# Patient Record
Sex: Male | Born: 1945 | Race: White | Hispanic: No | Marital: Married | Smoking: Former smoker
Health system: Southern US, Community
[De-identification: ages and names within clinical notes are randomized; demographics above are authoritative.]

## PROBLEM LIST (undated history)

## (undated) DIAGNOSIS — G609 Hereditary and idiopathic neuropathy, unspecified: Secondary | ICD-10-CM

## (undated) DIAGNOSIS — I714 Abdominal aortic aneurysm, without rupture, unspecified: Secondary | ICD-10-CM

## (undated) DIAGNOSIS — E7800 Pure hypercholesterolemia, unspecified: Secondary | ICD-10-CM

## (undated) DIAGNOSIS — F5104 Psychophysiologic insomnia: Secondary | ICD-10-CM

## (undated) DIAGNOSIS — I10 Essential (primary) hypertension: Secondary | ICD-10-CM

## (undated) DIAGNOSIS — M5126 Other intervertebral disc displacement, lumbar region: Secondary | ICD-10-CM

## (undated) HISTORY — DX: Psychophysiologic insomnia: F51.04

## (undated) HISTORY — DX: Abdominal aortic aneurysm, without rupture, unspecified: I71.40

## (undated) HISTORY — PX: SHOULDER SURGERY: SHX246

## (undated) HISTORY — DX: Hereditary and idiopathic neuropathy, unspecified: G60.9

## (undated) HISTORY — DX: Other intervertebral disc displacement, lumbar region: M51.26

## (undated) HISTORY — DX: Abdominal aortic aneurysm, without rupture: I71.4

## (undated) HISTORY — DX: Pure hypercholesterolemia, unspecified: E78.00

## (undated) HISTORY — DX: Essential (primary) hypertension: I10

## (undated) HISTORY — PX: OTHER SURGICAL HISTORY: SHX169

---

## 2016-01-29 ENCOUNTER — Telehealth: Payer: Self-pay | Admitting: Cardiology

## 2016-01-29 NOTE — Telephone Encounter (Signed)
Received records from [facility] for appointment on 02/12/16 with Dr Stanford Breed.  Records given to [facility] (medical records) for Dr Jacalyn Lefevre schedule on 02/12/16. lp

## 2016-02-06 NOTE — Progress Notes (Signed)
HPI: 71 yo male for evaluation of hypertension. Based on outside records he has had renal Dopplers previously that showed no renal artery stenosis and normal catecholamine and cortisol levels. Also with history of abdominal aortic aneurysm measuring 3.2 cm. Followed by primary care. Patient has had high blood pressure for approximately 10-15 years. It has been somewhat difficult to control. He denies dyspnea, chest pain, palpitations or syncope.  Current Outpatient Prescriptions  Medication Sig Dispense Refill  . amLODipine (NORVASC) 5 MG tablet Take 5 mg by mouth 2 (two) times daily.    Marland Kitchen aspirin EC 81 MG tablet Take 81 mg by mouth daily.    Marland Kitchen doxazosin (CARDURA) 4 MG tablet Take 4 mg by mouth 2 (two) times daily.    . metoprolol (LOPRESSOR) 100 MG tablet Take 100 mg by mouth 2 (two) times daily.    . Multiple Vitamins-Minerals (MULTIVITAMIN PO) Take 1 tablet by mouth daily.    . quinapril-hydrochlorothiazide (ACCURETIC) 20-25 MG tablet Take 1 tablet by mouth daily.     No current facility-administered medications for this visit.     No Known Allergies   Past Medical History:  Diagnosis Date  . AAA (abdominal aortic aneurysm) without rupture ([place])   . Chronic insomnia   . Herniated lumbar intervertebral disc   . Hypertension   . Idiopathic peripheral neuropathy   . Pure hypercholesterolemia     Past Surgical History:  Procedure Laterality Date  . Arm surgery    . SHOULDER SURGERY      Social History   Social History  . Marital status: Married    Spouse name: N/A  . Number of children: 3  . Years of education: N/A   Occupational History  .      Retired   Social History Main Topics  . Smoking status: Former Research scientist (life sciences)  . Smokeless tobacco: Never Used  . Alcohol use Yes     Comment: 14  . Drug use: Unknown  . Sexual activity: Not on file   Other Topics Concern  . Not on file   Social History Narrative  . No narrative on file    Family History  Problem  Relation Age of Onset  . Hypertension Mother   . Hypertension Father     ROS: Some arthralgias but no fevers or chills, productive cough, hemoptysis, dysphasia, odynophagia, melena, hematochezia, dysuria, hematuria, rash, seizure activity, orthopnea, PND, pedal edema, claudication. Remaining systems are negative.  Physical Exam:   Blood pressure (!) 116/58, pulse (!) 49, height 6' (1.829 m), weight 225 lb (102.1 kg).  General:  Well developed/well nourished in NAD Skin warm/dry Patient not depressed No peripheral clubbing Back-normal HEENT-normal/normal eyelids  Neck supple/normal carotid upstroke bilaterally; no bruits; no JVD chest - CTA/ normal expansion CV - RRR/normal S1 and S2; no murmurs, rubs or gallops;  PMI nondisplaced Abdomen -NT/ND, no HSM, no mass, + bowel sounds, no bruit 2+ femoral pulses, no bruits Ext-no edema, chords, 2+ DP Neuro-grossly nonfocal  ECG -Sinus bradycardia at a rate of 49. Nonspecific ST changes. Left ventricular hypertrophy.  A/P  1 hypertension-I checked blood pressure in both arms today. It was 132-136/60-64. He has had renal Dopplers checked previously that were negative, catecholamines and cortisol levels normal. He has primary hypertension. We will continue with present medications as his blood pressure appears to be controlled. I have asked him to bring his cuff to have his pressure checked and correlate with ours to make sure that his home  cuff is accurate. We will make further adjustments based on follow-up readings. We discussed lifestyle modification including low sodium diet, decrease alcohol use, exercise and weight loss.  2 hyperlipidemia-management per primary care.  3 bradycardia-mild bradycardia on electrocardiogram but no symptoms. We will follow.  4 abdominal aortic aneurysm-followed by primary care.  Kirk Ruths, MD

## 2016-02-12 ENCOUNTER — Ambulatory Visit (INDEPENDENT_AMBULATORY_CARE_PROVIDER_SITE_OTHER): Payer: Medicare HMO | Admitting: Cardiology

## 2016-02-12 ENCOUNTER — Encounter: Payer: Self-pay | Admitting: Cardiology

## 2016-02-12 VITALS — BP 116/58 | HR 49 | Ht 72.0 in | Wt 225.0 lb

## 2016-02-12 DIAGNOSIS — I714 Abdominal aortic aneurysm, without rupture, unspecified: Secondary | ICD-10-CM

## 2016-02-12 DIAGNOSIS — I10 Essential (primary) hypertension: Secondary | ICD-10-CM | POA: Diagnosis not present

## 2016-02-12 NOTE — Patient Instructions (Signed)
Your physician wants you to follow-up in: 3 MONTHS WITH DR CRENSHAW You will receive a reminder letter in the mail two months in advance. If you don't receive a letter, please call our office to schedule the follow-up appointment.  

## 2016-02-18 DIAGNOSIS — H35033 Hypertensive retinopathy, bilateral: Secondary | ICD-10-CM | POA: Diagnosis not present

## 2016-02-18 DIAGNOSIS — H524 Presbyopia: Secondary | ICD-10-CM | POA: Diagnosis not present

## 2016-02-18 DIAGNOSIS — Z0100 Encounter for examination of eyes and vision without abnormal findings: Secondary | ICD-10-CM | POA: Diagnosis not present

## 2016-02-27 ENCOUNTER — Telehealth: Payer: Self-pay | Admitting: *Deleted

## 2016-02-27 NOTE — Telephone Encounter (Signed)
Follow BP and let us know results Kirk Ruths

## 2016-02-27 NOTE — Telephone Encounter (Signed)
Left message for patient of dr crenshaw's recommendations. 

## 2016-02-27 NOTE — Telephone Encounter (Signed)
In person BP check.   Pt of Dr Stanford Breed, new OV on 1/9. He was advised to return for a check on his BP to compare home cuff to manual reading here.  He reviewed home BP log w me, has readings going back a year and notes some variation, but generally readings in [id number] to [id number] systolic range.  I took readings with his cuff and manually. He was 144/72 manually, 141/77 auto.  Note that when he initially applied his home cuff, he placed the artery marker to the side - I corrected this w him. (it's a dark spot, not easily seen). Discussed w Raquel pharmD and w patient.  Patient states he uses the air hose as a landmark and usually places this over a visible vein to inferior side of his arm - so we surmised he is probably routinely misplacing the artery marker at home, could contribute to falsely elevated readings. Patient acknowledges instruction and will correct how he uses cuff. He will follow his readings for a week or so. No changes to meds were indicated at previous appt. Patient aware he may come back by office, or call w the BP log and we will review w Dr. Stanford Breed to see if med adjustment advised.  He's agreeable to plan. Routed to provider as fyi/for further recommendations.

## 2016-03-13 DIAGNOSIS — L821 Other seborrheic keratosis: Secondary | ICD-10-CM | POA: Diagnosis not present

## 2016-03-13 DIAGNOSIS — L570 Actinic keratosis: Secondary | ICD-10-CM | POA: Diagnosis not present

## 2016-03-13 DIAGNOSIS — D1801 Hemangioma of skin and subcutaneous tissue: Secondary | ICD-10-CM | POA: Diagnosis not present

## 2016-03-13 DIAGNOSIS — Z85828 Personal history of other malignant neoplasm of skin: Secondary | ICD-10-CM | POA: Diagnosis not present

## 2016-04-03 DIAGNOSIS — Z7982 Long term (current) use of aspirin: Secondary | ICD-10-CM | POA: Diagnosis not present

## 2016-04-03 DIAGNOSIS — G4700 Insomnia, unspecified: Secondary | ICD-10-CM | POA: Diagnosis not present

## 2016-04-03 DIAGNOSIS — M545 Low back pain: Secondary | ICD-10-CM | POA: Diagnosis not present

## 2016-04-03 DIAGNOSIS — I714 Abdominal aortic aneurysm, without rupture: Secondary | ICD-10-CM | POA: Diagnosis not present

## 2016-04-03 DIAGNOSIS — Z0000 Encounter for general adult medical examination without abnormal findings: Secondary | ICD-10-CM | POA: Diagnosis not present

## 2016-04-03 DIAGNOSIS — G629 Polyneuropathy, unspecified: Secondary | ICD-10-CM | POA: Diagnosis not present

## 2016-04-03 DIAGNOSIS — Z87891 Personal history of nicotine dependence: Secondary | ICD-10-CM | POA: Diagnosis not present

## 2016-04-03 DIAGNOSIS — I10 Essential (primary) hypertension: Secondary | ICD-10-CM | POA: Diagnosis not present

## 2016-04-07 DIAGNOSIS — Z23 Encounter for immunization: Secondary | ICD-10-CM | POA: Diagnosis not present

## 2016-04-07 DIAGNOSIS — R319 Hematuria, unspecified: Secondary | ICD-10-CM | POA: Diagnosis not present

## 2016-04-07 DIAGNOSIS — Z0000 Encounter for general adult medical examination without abnormal findings: Secondary | ICD-10-CM | POA: Diagnosis not present

## 2016-04-07 DIAGNOSIS — I714 Abdominal aortic aneurysm, without rupture: Secondary | ICD-10-CM | POA: Diagnosis not present

## 2016-04-07 DIAGNOSIS — I10 Essential (primary) hypertension: Secondary | ICD-10-CM | POA: Diagnosis not present

## 2016-04-07 DIAGNOSIS — G609 Hereditary and idiopathic neuropathy, unspecified: Secondary | ICD-10-CM | POA: Diagnosis not present

## 2016-04-07 DIAGNOSIS — R7309 Other abnormal glucose: Secondary | ICD-10-CM | POA: Diagnosis not present

## 2016-04-07 DIAGNOSIS — Z125 Encounter for screening for malignant neoplasm of prostate: Secondary | ICD-10-CM | POA: Diagnosis not present

## 2016-04-07 DIAGNOSIS — E7800 Pure hypercholesterolemia, unspecified: Secondary | ICD-10-CM | POA: Diagnosis not present

## 2016-05-14 DIAGNOSIS — R319 Hematuria, unspecified: Secondary | ICD-10-CM | POA: Diagnosis not present

## 2016-05-14 DIAGNOSIS — I714 Abdominal aortic aneurysm, without rupture: Secondary | ICD-10-CM | POA: Diagnosis not present

## 2016-05-22 DIAGNOSIS — N329 Bladder disorder, unspecified: Secondary | ICD-10-CM | POA: Diagnosis not present

## 2016-05-22 DIAGNOSIS — I714 Abdominal aortic aneurysm, without rupture: Secondary | ICD-10-CM | POA: Diagnosis not present

## 2016-05-28 DIAGNOSIS — C672 Malignant neoplasm of lateral wall of bladder: Secondary | ICD-10-CM | POA: Diagnosis not present

## 2016-05-28 DIAGNOSIS — R310 Gross hematuria: Secondary | ICD-10-CM | POA: Diagnosis not present

## 2016-06-06 DIAGNOSIS — N359 Urethral stricture, unspecified: Secondary | ICD-10-CM | POA: Diagnosis not present

## 2016-06-06 DIAGNOSIS — E785 Hyperlipidemia, unspecified: Secondary | ICD-10-CM | POA: Diagnosis not present

## 2016-06-06 DIAGNOSIS — Z79899 Other long term (current) drug therapy: Secondary | ICD-10-CM | POA: Diagnosis not present

## 2016-06-06 DIAGNOSIS — C672 Malignant neoplasm of lateral wall of bladder: Secondary | ICD-10-CM | POA: Diagnosis not present

## 2016-06-06 DIAGNOSIS — Z7982 Long term (current) use of aspirin: Secondary | ICD-10-CM | POA: Diagnosis not present

## 2016-06-06 DIAGNOSIS — I10 Essential (primary) hypertension: Secondary | ICD-10-CM | POA: Diagnosis not present

## 2016-06-06 DIAGNOSIS — Z87891 Personal history of nicotine dependence: Secondary | ICD-10-CM | POA: Diagnosis not present

## 2016-09-26 DIAGNOSIS — C672 Malignant neoplasm of lateral wall of bladder: Secondary | ICD-10-CM | POA: Diagnosis not present

## 2016-11-11 DIAGNOSIS — R69 Illness, unspecified: Secondary | ICD-10-CM | POA: Diagnosis not present

## 2017-03-02 DIAGNOSIS — R69 Illness, unspecified: Secondary | ICD-10-CM | POA: Diagnosis not present

## 2017-03-13 DIAGNOSIS — Z08 Encounter for follow-up examination after completed treatment for malignant neoplasm: Secondary | ICD-10-CM | POA: Diagnosis not present

## 2017-03-13 DIAGNOSIS — Z8551 Personal history of malignant neoplasm of bladder: Secondary | ICD-10-CM | POA: Diagnosis not present

## 2017-03-13 DIAGNOSIS — R310 Gross hematuria: Secondary | ICD-10-CM | POA: Diagnosis not present

## 2017-03-19 DIAGNOSIS — Z85828 Personal history of other malignant neoplasm of skin: Secondary | ICD-10-CM | POA: Diagnosis not present

## 2017-03-19 DIAGNOSIS — L821 Other seborrheic keratosis: Secondary | ICD-10-CM | POA: Diagnosis not present

## 2017-03-19 DIAGNOSIS — L820 Inflamed seborrheic keratosis: Secondary | ICD-10-CM | POA: Diagnosis not present

## 2017-03-19 DIAGNOSIS — D225 Melanocytic nevi of trunk: Secondary | ICD-10-CM | POA: Diagnosis not present

## 2017-03-19 DIAGNOSIS — I788 Other diseases of capillaries: Secondary | ICD-10-CM | POA: Diagnosis not present

## 2017-03-19 DIAGNOSIS — L570 Actinic keratosis: Secondary | ICD-10-CM | POA: Diagnosis not present

## 2017-04-06 DIAGNOSIS — Z125 Encounter for screening for malignant neoplasm of prostate: Secondary | ICD-10-CM | POA: Diagnosis not present

## 2017-04-06 DIAGNOSIS — I10 Essential (primary) hypertension: Secondary | ICD-10-CM | POA: Diagnosis not present

## 2017-04-06 DIAGNOSIS — R7309 Other abnormal glucose: Secondary | ICD-10-CM | POA: Diagnosis not present

## 2017-04-06 DIAGNOSIS — E7800 Pure hypercholesterolemia, unspecified: Secondary | ICD-10-CM | POA: Diagnosis not present

## 2017-04-08 DIAGNOSIS — Z0000 Encounter for general adult medical examination without abnormal findings: Secondary | ICD-10-CM | POA: Diagnosis not present

## 2017-04-08 DIAGNOSIS — I714 Abdominal aortic aneurysm, without rupture: Secondary | ICD-10-CM | POA: Diagnosis not present

## 2017-04-08 DIAGNOSIS — G609 Hereditary and idiopathic neuropathy, unspecified: Secondary | ICD-10-CM | POA: Diagnosis not present

## 2017-04-08 DIAGNOSIS — I10 Essential (primary) hypertension: Secondary | ICD-10-CM | POA: Diagnosis not present

## 2017-04-08 DIAGNOSIS — E7800 Pure hypercholesterolemia, unspecified: Secondary | ICD-10-CM | POA: Diagnosis not present

## 2017-04-08 DIAGNOSIS — M5126 Other intervertebral disc displacement, lumbar region: Secondary | ICD-10-CM | POA: Diagnosis not present

## 2017-04-08 DIAGNOSIS — C672 Malignant neoplasm of lateral wall of bladder: Secondary | ICD-10-CM | POA: Diagnosis not present

## 2017-06-08 DIAGNOSIS — H524 Presbyopia: Secondary | ICD-10-CM | POA: Diagnosis not present

## 2017-06-08 DIAGNOSIS — H35033 Hypertensive retinopathy, bilateral: Secondary | ICD-10-CM | POA: Diagnosis not present

## 2017-06-11 DIAGNOSIS — S29012A Strain of muscle and tendon of back wall of thorax, initial encounter: Secondary | ICD-10-CM | POA: Diagnosis not present

## 2017-07-07 DIAGNOSIS — M5090 Cervical disc disorder, unspecified, unspecified cervical region: Secondary | ICD-10-CM | POA: Diagnosis not present

## 2017-07-07 DIAGNOSIS — R52 Pain, unspecified: Secondary | ICD-10-CM | POA: Diagnosis not present

## 2017-07-28 DIAGNOSIS — M5090 Cervical disc disorder, unspecified, unspecified cervical region: Secondary | ICD-10-CM | POA: Diagnosis not present

## 2017-07-29 DIAGNOSIS — M7918 Myalgia, other site: Secondary | ICD-10-CM | POA: Diagnosis not present

## 2017-07-29 DIAGNOSIS — M5030 Other cervical disc degeneration, unspecified cervical region: Secondary | ICD-10-CM | POA: Diagnosis not present

## 2017-08-11 DIAGNOSIS — Z79891 Long term (current) use of opiate analgesic: Secondary | ICD-10-CM | POA: Diagnosis not present

## 2017-08-11 DIAGNOSIS — Z809 Family history of malignant neoplasm, unspecified: Secondary | ICD-10-CM | POA: Diagnosis not present

## 2017-08-11 DIAGNOSIS — J309 Allergic rhinitis, unspecified: Secondary | ICD-10-CM | POA: Diagnosis not present

## 2017-08-11 DIAGNOSIS — G629 Polyneuropathy, unspecified: Secondary | ICD-10-CM | POA: Diagnosis not present

## 2017-08-11 DIAGNOSIS — I10 Essential (primary) hypertension: Secondary | ICD-10-CM | POA: Diagnosis not present

## 2017-08-11 DIAGNOSIS — R32 Unspecified urinary incontinence: Secondary | ICD-10-CM | POA: Diagnosis not present

## 2017-08-11 DIAGNOSIS — Z8249 Family history of ischemic heart disease and other diseases of the circulatory system: Secondary | ICD-10-CM | POA: Diagnosis not present

## 2017-08-11 DIAGNOSIS — G8929 Other chronic pain: Secondary | ICD-10-CM | POA: Diagnosis not present

## 2017-08-11 DIAGNOSIS — C679 Malignant neoplasm of bladder, unspecified: Secondary | ICD-10-CM | POA: Diagnosis not present

## 2017-08-11 DIAGNOSIS — Z7982 Long term (current) use of aspirin: Secondary | ICD-10-CM | POA: Diagnosis not present

## 2017-08-12 DIAGNOSIS — I714 Abdominal aortic aneurysm, without rupture: Secondary | ICD-10-CM | POA: Diagnosis not present

## 2017-08-17 DIAGNOSIS — M47812 Spondylosis without myelopathy or radiculopathy, cervical region: Secondary | ICD-10-CM | POA: Diagnosis not present

## 2017-08-17 DIAGNOSIS — M50223 Other cervical disc displacement at C6-C7 level: Secondary | ICD-10-CM | POA: Diagnosis not present

## 2017-08-26 DIAGNOSIS — M4802 Spinal stenosis, cervical region: Secondary | ICD-10-CM | POA: Diagnosis not present

## 2017-08-26 DIAGNOSIS — M5412 Radiculopathy, cervical region: Secondary | ICD-10-CM | POA: Diagnosis not present

## 2017-08-26 DIAGNOSIS — M5030 Other cervical disc degeneration, unspecified cervical region: Secondary | ICD-10-CM | POA: Diagnosis not present

## 2017-09-09 DIAGNOSIS — Z08 Encounter for follow-up examination after completed treatment for malignant neoplasm: Secondary | ICD-10-CM | POA: Diagnosis not present

## 2017-09-09 DIAGNOSIS — Z8551 Personal history of malignant neoplasm of bladder: Secondary | ICD-10-CM | POA: Diagnosis not present

## 2017-09-15 DIAGNOSIS — M5030 Other cervical disc degeneration, unspecified cervical region: Secondary | ICD-10-CM | POA: Diagnosis not present

## 2017-09-15 DIAGNOSIS — Z79899 Other long term (current) drug therapy: Secondary | ICD-10-CM | POA: Diagnosis not present

## 2017-09-15 DIAGNOSIS — Z7982 Long term (current) use of aspirin: Secondary | ICD-10-CM | POA: Diagnosis not present

## 2017-09-15 DIAGNOSIS — M5412 Radiculopathy, cervical region: Secondary | ICD-10-CM | POA: Diagnosis not present

## 2017-09-15 DIAGNOSIS — M5010 Cervical disc disorder with radiculopathy, unspecified cervical region: Secondary | ICD-10-CM | POA: Diagnosis not present

## 2017-09-15 DIAGNOSIS — M4722 Other spondylosis with radiculopathy, cervical region: Secondary | ICD-10-CM | POA: Diagnosis not present

## 2017-09-15 DIAGNOSIS — Z87891 Personal history of nicotine dependence: Secondary | ICD-10-CM | POA: Diagnosis not present

## 2017-09-15 DIAGNOSIS — M4802 Spinal stenosis, cervical region: Secondary | ICD-10-CM | POA: Diagnosis not present

## 2017-10-16 DIAGNOSIS — M47812 Spondylosis without myelopathy or radiculopathy, cervical region: Secondary | ICD-10-CM | POA: Diagnosis not present

## 2017-10-16 DIAGNOSIS — M5030 Other cervical disc degeneration, unspecified cervical region: Secondary | ICD-10-CM | POA: Diagnosis not present

## 2017-12-02 DIAGNOSIS — R69 Illness, unspecified: Secondary | ICD-10-CM | POA: Diagnosis not present

## 2018-03-01 DIAGNOSIS — R69 Illness, unspecified: Secondary | ICD-10-CM | POA: Diagnosis not present

## 2018-03-18 DIAGNOSIS — R69 Illness, unspecified: Secondary | ICD-10-CM | POA: Diagnosis not present

## 2018-03-25 DIAGNOSIS — D485 Neoplasm of uncertain behavior of skin: Secondary | ICD-10-CM | POA: Diagnosis not present

## 2018-03-25 DIAGNOSIS — L738 Other specified follicular disorders: Secondary | ICD-10-CM | POA: Diagnosis not present

## 2018-03-25 DIAGNOSIS — C4441 Basal cell carcinoma of skin of scalp and neck: Secondary | ICD-10-CM | POA: Diagnosis not present

## 2018-03-25 DIAGNOSIS — D1801 Hemangioma of skin and subcutaneous tissue: Secondary | ICD-10-CM | POA: Diagnosis not present

## 2018-03-25 DIAGNOSIS — Z85828 Personal history of other malignant neoplasm of skin: Secondary | ICD-10-CM | POA: Diagnosis not present

## 2018-03-25 DIAGNOSIS — L821 Other seborrheic keratosis: Secondary | ICD-10-CM | POA: Diagnosis not present

## 2018-03-25 DIAGNOSIS — L570 Actinic keratosis: Secondary | ICD-10-CM | POA: Diagnosis not present

## 2018-03-25 DIAGNOSIS — L820 Inflamed seborrheic keratosis: Secondary | ICD-10-CM | POA: Diagnosis not present

## 2018-03-25 DIAGNOSIS — C44719 Basal cell carcinoma of skin of left lower limb, including hip: Secondary | ICD-10-CM | POA: Diagnosis not present

## 2018-03-25 DIAGNOSIS — D225 Melanocytic nevi of trunk: Secondary | ICD-10-CM | POA: Diagnosis not present

## 2018-08-17 DIAGNOSIS — Z125 Encounter for screening for malignant neoplasm of prostate: Secondary | ICD-10-CM | POA: Diagnosis not present

## 2018-08-17 DIAGNOSIS — I10 Essential (primary) hypertension: Secondary | ICD-10-CM | POA: Diagnosis not present

## 2018-08-19 DIAGNOSIS — G609 Hereditary and idiopathic neuropathy, unspecified: Secondary | ICD-10-CM | POA: Diagnosis not present

## 2018-08-19 DIAGNOSIS — Z0000 Encounter for general adult medical examination without abnormal findings: Secondary | ICD-10-CM | POA: Diagnosis not present

## 2018-08-19 DIAGNOSIS — I10 Essential (primary) hypertension: Secondary | ICD-10-CM | POA: Diagnosis not present

## 2018-08-19 DIAGNOSIS — R7309 Other abnormal glucose: Secondary | ICD-10-CM | POA: Diagnosis not present

## 2018-08-19 DIAGNOSIS — I714 Abdominal aortic aneurysm, without rupture: Secondary | ICD-10-CM | POA: Diagnosis not present

## 2018-08-19 DIAGNOSIS — E7800 Pure hypercholesterolemia, unspecified: Secondary | ICD-10-CM | POA: Diagnosis not present

## 2018-09-09 DIAGNOSIS — Z8551 Personal history of malignant neoplasm of bladder: Secondary | ICD-10-CM | POA: Diagnosis not present

## 2018-09-09 DIAGNOSIS — C672 Malignant neoplasm of lateral wall of bladder: Secondary | ICD-10-CM | POA: Diagnosis not present

## 2018-09-09 DIAGNOSIS — Z08 Encounter for follow-up examination after completed treatment for malignant neoplasm: Secondary | ICD-10-CM | POA: Diagnosis not present

## 2018-10-15 ENCOUNTER — Telehealth: Payer: Self-pay | Admitting: Cardiology

## 2018-10-15 NOTE — Telephone Encounter (Signed)
New message     [place] the [place] on [place] couch reading a book.  All of a suddenly both arms went numb and starting hurting.  Pt then thew up.  Could this be from his heart?  Please call

## 2018-10-15 NOTE — Telephone Encounter (Signed)
Called pt about concerns with numbness in both arms and vomiting episode on 10/13/2018. Pt also explained that he became SOB 10/15/2018 while mowing. This nurse asked about any chest pain and recurrent symptoms, the pt denied. This nurse advised the pt to be seen. There were no in-patient appts with Dr. Stanford Breed or related team next week. The pt is scheduled for a virtual appt on 10/19/18 @ 1100am. This nurse also educated the pt if he was to have these symptoms and any chest pain that is unrelieved to go to the ER. The patient verbalized understanding.

## 2018-10-18 NOTE — Progress Notes (Signed)
Virtual Visit via Video Note   This visit type was conducted due to national recommendations for restrictions regarding the COVID-19 Pandemic (e.g. social distancing) in an effort to limit this patient's exposure and mitigate transmission in our community.  Due to his co-morbid illnesses, this patient is at least at moderate risk for complications without adequate follow up.  This format is felt to be most appropriate for this patient at this time.  All issues noted in this document were discussed and addressed.  A limited physical exam was performed with this format.  Please refer to the patient's chart for his consent to telehealth for [facility].   Date:  10/19/2018   ID:  Curtis Weiss, DOB July 12, 1945, MRN [id number]  Patient Location:Home Provider Location: Home  PCP:  Drake Leach, MD  Cardiologist:  Dr Stanford Breed  Evaluation Performed:  Follow-Up Visit  Chief Complaint:  FU hypertension  History of Present Illness:    FU hypertension. Based on outside records he has had renal Dopplers previously that showed no renal artery stenosis and normal catecholamine and cortisol levels. Also with history of abdominal aortic aneurysm measuring 3.2 cm. Followed by primary care. Since last seen, 6 days ago the patient states he had bilateral upper extremity pain transiently for 5 minutes.  It was sudden in onset and resolve spontaneously.  There was associated nausea/vomiting and mild diaphoresis but no dyspnea.  He has had no further symptoms since then.  He also notes increased dyspnea on exertion for approximately 6 months but denies orthopnea, PND or pedal edema.  No syncope.  The patient does not have symptoms concerning for COVID-19 infection (fever, chills, cough, or new shortness of breath).    Past Medical History:  Diagnosis Date  . AAA (abdominal aortic aneurysm) without rupture ([place])   . Chronic insomnia   . Herniated lumbar intervertebral disc   . Hypertension   .  Idiopathic peripheral neuropathy   . Pure hypercholesterolemia    Past Surgical History:  Procedure Laterality Date  . Arm surgery    . SHOULDER SURGERY       Current Meds  Medication Sig  . amLODipine (NORVASC) 5 MG tablet Take 5 mg by mouth 2 (two) times daily.  Marland Kitchen aspirin EC 81 MG tablet Take 81 mg by mouth daily.  Marland Kitchen doxazosin (CARDURA) 4 MG tablet Take 4 mg by mouth 2 (two) times daily.  . metoprolol (LOPRESSOR) 100 MG tablet Take 100 mg by mouth 2 (two) times daily.  . Multiple Vitamins-Minerals (MULTIVITAMIN PO) Take 1 tablet by mouth daily.  . quinapril (ACCUPRIL) 20 MG tablet Take 1 tablet by mouth daily.     Allergies:   Patient has no known allergies.   Social History   Tobacco Use  . Smoking status: Former Research scientist (life sciences)  . Smokeless tobacco: Never Used  Substance Use Topics  . Alcohol use: Yes    Comment: 14  . Drug use: Not on file     Family Hx: The patient's family history includes Hypertension in his father and mother.  ROS:   Please see the history of present illness.    No Fever, chills  or productive cough All other systems reviewed and are negative.   Wt Readings from Last 3 Encounters:  10/19/18 215 lb (97.5 kg)  02/12/16 225 lb (102.1 kg)     Objective:    Vital Signs:  BP 130/71   Pulse (!) 47   Ht 6' (1.829 m)   Wt 215  lb (97.5 kg)   BMI 29.16 kg/m    VITAL SIGNS:  reviewed NAD Answers questions appropriately Normal affect Remainder of physical examination not performed (telehealth visit; coronavirus pandemic)  ASSESSMENT & PLAN:    1. Hypertension-patient has had previous renal Dopplers that were negative, catecholamines and cortisol levels also normal.  His blood pressure is elevated.  Increase quinapril to 40 mg daily.  Check potassium and renal function later this week. 2. Hyperlipidemia-managed by primary care. 3. Abdominal aortic aneurysm-schedule follow-up abdominal ultrasound 4. Arm pain-patient had sudden onset of bilateral  upper extremity pain 6 days ago.  Symptoms lasted 5 minutes and there was associated nausea and vomiting.  He has not had chest pain but I am concerned about the possibility of an anginal equivalent.  He has had no further symptoms since then though he has had dyspnea on exertion for 6 months.  I will have him seen in the office by an APP this week.  We will need to check ECG.  If significantly different compared to previous may need to consider cardiac catheterization.  If unchanged would schedule echocardiogram to assess LV function and cardiac CTA to exclude coronary disease pending results of BUN and creatinine.   COVID-19 Education: The importance of social distancing was discussed today.  Time:   Today, I have spent 17 minutes with the patient with telehealth technology discussing the above problems.     Medication Adjustments/Labs and Tests Ordered: Current medicines are reviewed at length with the patient today.  Concerns regarding medicines are outlined above.   Tests Ordered: No orders of the defined types were placed in this encounter.   Medication Changes: No orders of the defined types were placed in this encounter.   Follow Up:  In Person in 3 month(s)  Signed, Kirk Ruths, MD  10/19/2018 10:45 AM    [place]

## 2018-10-19 ENCOUNTER — Telehealth (INDEPENDENT_AMBULATORY_CARE_PROVIDER_SITE_OTHER): Payer: Medicare HMO | Admitting: Cardiology

## 2018-10-19 ENCOUNTER — Encounter: Payer: Self-pay | Admitting: Cardiology

## 2018-10-19 VITALS — BP 130/71 | HR 47 | Ht 72.0 in | Wt 215.0 lb

## 2018-10-19 DIAGNOSIS — R072 Precordial pain: Secondary | ICD-10-CM

## 2018-10-19 DIAGNOSIS — I10 Essential (primary) hypertension: Secondary | ICD-10-CM

## 2018-10-19 DIAGNOSIS — I714 Abdominal aortic aneurysm, without rupture, unspecified: Secondary | ICD-10-CM

## 2018-10-19 MED ORDER — QUINAPRIL HCL 40 MG PO TABS: 40.0000 mg | ORAL_TABLET | Freq: Every day | ORAL | 1 refills | Status: DC

## 2018-10-19 NOTE — Patient Instructions (Addendum)
Medication Instructions:  Increase Quinapril to 40 mg daily.  If you need a refill on your cardiac medications before your next appointment, please call your pharmacy.   Lab work: BMET when you come to office tomorrow. If you have labs (blood work) drawn today and your tests are completely normal, you will receive your results only by: Marland Kitchen MyChart Message (if you have MyChart) OR . A paper copy in the mail If you have any lab test that is abnormal or we need to change your treatment, we will call you to review the results.  Testing/Procedures: Your physician has requested that you have an abdominal aorta duplex. During this test, an ultrasound is used to evaluate the aorta. Allow 30 minutes for this exam. Do not eat after midnight the day before and avoid carbonated beverages  Follow-Up: At [facility], you and your health needs are our priority.  As part of our continuing mission to provide you with exceptional heart care, we have created designated Provider Care Teams.  These Care Teams include your primary Cardiologist (physician) and Advanced Practice Providers (APPs -  Physician Assistants and Nurse Practitioners) who all work together to provide you with the care you need, when you need it. . Follow up with Jory Sims, NP tomorrow in office.

## 2018-10-20 ENCOUNTER — Encounter: Payer: Self-pay | Admitting: Adult Health

## 2018-10-20 ENCOUNTER — Ambulatory Visit: Payer: Medicare HMO | Admitting: General Practice

## 2018-10-20 ENCOUNTER — Other Ambulatory Visit: Payer: Self-pay

## 2018-10-20 VITALS — BP 136/68 | HR 45 | Ht 72.0 in | Wt 217.0 lb

## 2018-10-20 DIAGNOSIS — Z79899 Other long term (current) drug therapy: Secondary | ICD-10-CM | POA: Diagnosis not present

## 2018-10-20 DIAGNOSIS — M79601 Pain in right arm: Secondary | ICD-10-CM | POA: Diagnosis not present

## 2018-10-20 DIAGNOSIS — R0602 Shortness of breath: Secondary | ICD-10-CM | POA: Diagnosis not present

## 2018-10-20 DIAGNOSIS — M79602 Pain in left arm: Secondary | ICD-10-CM

## 2018-10-20 DIAGNOSIS — R079 Chest pain, unspecified: Secondary | ICD-10-CM | POA: Diagnosis not present

## 2018-10-20 DIAGNOSIS — I10 Essential (primary) hypertension: Secondary | ICD-10-CM | POA: Diagnosis not present

## 2018-10-20 DIAGNOSIS — E7800 Pure hypercholesterolemia, unspecified: Secondary | ICD-10-CM | POA: Diagnosis not present

## 2018-10-20 DIAGNOSIS — I714 Abdominal aortic aneurysm, without rupture, unspecified: Secondary | ICD-10-CM

## 2018-10-20 NOTE — Patient Instructions (Addendum)
Medication Instructions:  Continue current medications  If you need a refill on your cardiac medications before your next appointment, please call your pharmacy.  Labwork: CMP today HERE IN OUR OFFICE AT [facility]  You will NOT need to fast   Take the provided lab slips with you to the lab for your blood draw.   When you have your labs (blood work) drawn today and your tests are completely normal, you will receive your results only by MyChart Message (if you have MyChart) -OR-  A paper copy in the mail.  If you have any lab test that is abnormal or we need to change your treatment, we will call you to review these results.  Testing/Procedures: Your physician has requested that you have an echocardiogram. Echocardiography is a painless test that uses sound waves to create images of your heart. It provides your doctor with information about the size and shape of your heart and how well your heart's chambers and valves are working. This procedure takes approximately one hour. There are no restrictions for this procedure.  Follow-Up: . Your physician recommends that you schedule a follow-up appointment in: 3 Months with Dr Stanford Breed   At [facility], you and your health needs are our priority.  As part of our continuing mission to provide you with exceptional heart care, we have created designated Provider Care Teams.  These Care Teams include your primary Cardiologist (physician) and Advanced Practice Providers (APPs -  Physician Assistants and Nurse Practitioners) who all work together to provide you with the care you need, when you need it.  Thank you for choosing [facility] at [facility]!!

## 2018-10-20 NOTE — Progress Notes (Signed)
Cardiology Clinic Note   Patient Name: Curtis Weiss Date of Encounter: 10/20/2018  Primary Care Provider:  Drake Leach, MD Primary Cardiologist:  Kirk Ruths, MD  Patient Profile    Curtis Weiss 73 year old male presents today for follow-up of his essential hypertension, chest pain, and AAA.  Past Medical History    Past Medical History:  Diagnosis Date  . AAA (abdominal aortic aneurysm) without rupture ([place])   . Chronic insomnia   . Herniated lumbar intervertebral disc   . Hypertension   . Idiopathic peripheral neuropathy   . Pure hypercholesterolemia    Past Surgical History:  Procedure Laterality Date  . Arm surgery    . SHOULDER SURGERY      Allergies  No Known Allergies  History of Present Illness    Curtis Weiss was last seen by Dr. Stanford Breed via virtual platform 10/19/2018.  He underwent renal Doppler US which showed no renal artery stenosis and normal catecholamines and cortisol levels.  He has a history of AAA measuring 3.2 cm which is followed by his PCP.  7 days ago patient reported having bilateral upper extremity pain that was transient in nature for 5 minutes.  The pain onset was sudden and resolve spontaneously, he also became nauseated, vomited and was diaphoretic.  No dyspnea was noted.  He denied recurrent symptoms.  Also, he had noticed increased dyspnea with exertion over the last 6 months.  His PMH also includes chronic insomnia, hypertension, idiopathic peripheral neuropathy, and hypercholesterolemia.  He presents to the clinic today and states he has not had any further episodes of nausea and vomiting.  He states that for the last 2 years he has had neuropathy in his hands and feet with numbness and tingling.  He goes on to say that 1 year ago he had thoracic spinal injections to alleviate neurologic pain that he was having.  He does notice that he is more short of breath when he is exercising and doing yard work.  He states that he walks 2-3 times  a week for about 3 miles at a time.  He also golfs 2 times per week and enjoys fishing as well.  He denies chest pain, lower extremity edema, fatigue, palpitations, melena, hematuria, hemoptysis, diaphoresis, weakness, presyncope, syncope, orthopnea, and PND.   Home Medications    Prior to Admission medications   Medication Sig Start Date End Date Taking? Authorizing Provider  amLODipine (NORVASC) 5 MG tablet Take 5 mg by mouth 2 (two) times daily. 11/30/15   [provider]  aspirin EC 81 MG tablet Take 81 mg by mouth daily.    [provider]  doxazosin (CARDURA) 4 MG tablet Take 4 mg by mouth 2 (two) times daily. 01/24/16   [provider]  metoprolol (LOPRESSOR) 100 MG tablet Take 100 mg by mouth 2 (two) times daily.    [provider]  Multiple Vitamins-Minerals (MULTIVITAMIN PO) Take 1 tablet by mouth daily.    [provider]  quinapril (ACCUPRIL) 40 MG tablet Take 1 tablet (40 mg total) by mouth daily. 10/19/18   Lelon Perla, MD    Family History    Family History  Problem Relation Age of Onset  . Hypertension Mother   . Hypertension Father    He indicated that his mother is deceased. He indicated that his father is deceased. He indicated that his maternal grandmother is deceased. He indicated that his maternal grandfather is deceased. He indicated that his paternal grandmother is  deceased. He indicated that his paternal grandfather is deceased.  Social History    Social History   Socioeconomic History  . Marital status: Married    Spouse name: Not on file  . Number of children: 3  . Years of education: Not on file  . Highest education level: Not on file  Occupational History    Comment: Retired  Scientific laboratory technician  . Financial resource strain: Not on file  . Food insecurity    Worry: Not on file    Inability: Not on file  . Transportation needs    Medical: Not on file    Non-medical: Not on file  Tobacco Use  .  Smoking status: Former Research scientist (life sciences)  . Smokeless tobacco: Never Used  Substance and Sexual Activity  . Alcohol use: Yes    Comment: 14  . Drug use: Not on file  . Sexual activity: Not on file  Lifestyle  . Physical activity    Days per week: Not on file    Minutes per session: Not on file  . Stress: Not on file  Relationships  . Social Herbalist on phone: Not on file    Gets together: Not on file    Attends religious service: Not on file    Active member of club or organization: Not on file    Attends meetings of clubs or organizations: Not on file    Relationship status: Not on file  . Intimate partner violence    Fear of current or ex partner: Not on file    Emotionally abused: Not on file    Physically abused: Not on file    Forced sexual activity: Not on file  Other Topics Concern  . Not on file  Social History Narrative  . Not on file     Review of Systems    General:  No chills, fever, night sweats or weight changes.  Cardiovascular:  No chest pain, dyspnea on exertion, edema, orthopnea, palpitations, paroxysmal nocturnal dyspnea. Dermatological: No rash, lesions/masses Respiratory: No cough, dyspnea Urologic: No hematuria, dysuria Abdominal:   No nausea, vomiting, diarrhea, bright red blood per rectum, melena, or hematemesis Neurologic:  No visual changes, wkns, changes in mental status. All other systems reviewed and are otherwise negative except as noted above.  Physical Exam    VS:  BP 136/68   Pulse (!) 45   Ht 6' (1.829 m)   Wt 217 lb (98.4 kg)   SpO2 96%   BMI 29.43 kg/m  , BMI Body mass index is 29.43 kg/m. GEN: Well nourished, well developed, in no acute distress. HEENT: normal. Neck: Supple, no JVD, carotid bruits, or masses. Cardiac: RRR, no murmurs, rubs, or gallops. No clubbing, cyanosis, edema.  Radials/DP/PT 2+ and equal bilaterally.  Respiratory:  Respirations regular and unlabored, clear to auscultation bilaterally. GI: Soft,  nontender, nondistended, BS + x 4. MS: no deformity or atrophy. Skin: warm and dry, no rash. Neuro:  Strength and sensation are intact. Psych: Normal affect.  Accessory Clinical Findings    ECG personally reviewed by me today-sinus bradycardia 49 bpm- No acute changes  EKG 02/12/2016 Sinus bradycardia 49 bpm  Assessment & Plan   1.  Arm pain- no chest discomfort today and no recurrent episodes since 1 week ago.  EKG shows sinus bradycardia 49 bpm.  Has had some increased shortness of breath with physical activity Continue aspirin 81 mg daily Keep appointment for follow-up AAA duplex on September 22 Ordered echocardiogram Follow-up  with neurologist  2.  Essential hypertension-BP [id number] Continue amlodipine 5 mg tablet daily Continue metoprolol 100 mg tablet twice daily Continue quinapril 40 mg tablet daily Keep appointment for follow-up AAA duplex on September 22 Ordered echocardiogram  Order CMP  3.  Hyperlipidemia-monitored by PCP  4.  Abdominal aortic aneurysm-measuring 3.2 cm. Keep appointment for follow-up AAA duplex on September 22  Disposition: Follow-up with Dr. Stanford Breed in 3 months.  Deberah Pelton, NP-C 10/20/2018, 5:14 PM

## 2018-10-21 LAB — COMPREHENSIVE METABOLIC PANEL
ALT: 36 IU/L (ref 0–44)
AST: 31 IU/L (ref 0–40)
Albumin/Globulin Ratio: 1.8 (ref 1.2–2.2)
Albumin: 4.6 g/dL (ref 3.7–4.7)
Alkaline Phosphatase: 75 IU/L (ref 39–117)
BUN/Creatinine Ratio: 12 (ref 10–24)
BUN: 13 mg/dL (ref 8–27)
Bilirubin Total: 0.8 mg/dL (ref 0.0–1.2)
CO2: 26 mmol/L (ref 20–29)
Calcium: 9.8 mg/dL (ref 8.6–10.2)
Chloride: 103 mmol/L (ref 96–106)
Creatinine, Ser: 1.13 mg/dL (ref 0.76–1.27)
GFR calc Af Amer: 74 mL/min/{1.73_m2} (ref >59)
GFR calc non Af Amer: 64 mL/min/{1.73_m2} (ref >59)
Globulin, Total: 2.5 g/dL (ref 1.5–4.5)
Glucose: 100 mg/dL — ABNORMAL HIGH (ref 65–99)
Potassium: 4.5 mmol/L (ref 3.5–5.2)
Sodium: 144 mmol/L (ref 134–144)
Total Protein: 7.1 g/dL (ref 6.0–8.5)

## 2018-10-25 DIAGNOSIS — R69 Illness, unspecified: Secondary | ICD-10-CM | POA: Diagnosis not present

## 2018-10-26 ENCOUNTER — Other Ambulatory Visit: Payer: Self-pay

## 2018-10-26 ENCOUNTER — Encounter: Payer: Self-pay | Admitting: *Deleted

## 2018-10-26 ENCOUNTER — Other Ambulatory Visit (HOSPITAL_COMMUNITY): Payer: Self-pay | Admitting: Cardiology

## 2018-10-26 ENCOUNTER — Ambulatory Visit (HOSPITAL_BASED_OUTPATIENT_CLINIC_OR_DEPARTMENT_OTHER): Payer: Medicare HMO

## 2018-10-26 ENCOUNTER — Ambulatory Visit (HOSPITAL_COMMUNITY)
Admission: RE | Admit: 2018-10-26 | Discharge: 2018-10-26 | Disposition: A | Discharge status: Home / Self Care | Payer: Medicare HMO | Source: Ambulatory Visit | Attending: Cardiovascular Disease | Admitting: Cardiovascular Disease

## 2018-10-26 DIAGNOSIS — R0602 Shortness of breath: Secondary | ICD-10-CM | POA: Diagnosis not present

## 2018-10-26 DIAGNOSIS — I714 Abdominal aortic aneurysm, without rupture, unspecified: Secondary | ICD-10-CM

## 2018-10-27 ENCOUNTER — Other Ambulatory Visit: Payer: Self-pay | Admitting: *Deleted

## 2018-10-27 DIAGNOSIS — I714 Abdominal aortic aneurysm, without rupture, unspecified: Secondary | ICD-10-CM

## 2018-11-17 ENCOUNTER — Telehealth: Payer: Self-pay | Admitting: Cardiology

## 2018-11-17 NOTE — Telephone Encounter (Signed)
° ° °  Patient calling to report 2 episodes of chest pain. He would also to discuss previus echo and AAA results    1. Are you having CP right now? no  2. Are you experiencing any other symptoms (ex. SOB, nausea, vomiting, sweating)? SOB at times. 3. How long have you been experiencing CP? weeks  4. Is your CP continuous or coming and going? Coming and going 5. Have you taken Nitroglycerin? no ?

## 2018-11-17 NOTE — Telephone Encounter (Signed)
Spoke to pt about SOB and chest pain. States that it has happened a few times where he feels pressure on his chest and it radiates to his arms and back and he is sweaty and SOB. Asked pt if he his currently having those symptoms, denied. Advised to call 911 or go to ER if having those symptoms again. Advised that he needs to be seen by a provider. Only available appt before November is a virtual. Scheduled virtual with Fabian Sharp.

## 2018-11-22 NOTE — Progress Notes (Unsigned)
{Choose 1 Note Type (Telehealth Visit or Telephone Visit):[phone]}   Date:  11/22/2018   ID:  Curtis Weiss, DOB 18-Jan-1946, MRN [id number]  {Patient [phone]::"Home"} {Provider Location:[phone]::"Home"}  PCP:  Drake Leach, MD  Cardiologist:  Kirk Ruths, MD  Electrophysiologist:  None   Evaluation Performed:  {Choose Visit Type:[phone]::"Follow-Up Visit"}  Chief Complaint:  DOE  History of Present Illness:    Curtis Weiss is a 73 y.o. male with hypertension, hyperlipidemia, AAA (3.2 cm). Hx of normal renal artery dopplers, normal catecholamines and cortisol levels. Pt was seen by Dr. Stanford Breed via telemedicine appt for DOE x 6 months, and one episode of sudden onset bilateral upper extremity pain. He was scheduled for an in-office visit for an EKG.  He had follow up echo and AAA [place]. Echo on 10/26/18 with normal EF, diastolic dysfunction, and no wall motion abnormality mentioned, but reduced GLS mentioned. Abdominal US shoed AAA 3.2 cm and annual screening was recommended.   He returns today for follow up   Need EKG on 10/20/18   CT coronary   The patient {does/does not:200015} have symptoms concerning for COVID-19 infection (fever, chills, cough, or new shortness of breath).    Past Medical History:  Diagnosis Date  . AAA (abdominal aortic aneurysm) without rupture ([place])   . Chronic insomnia   . Herniated lumbar intervertebral disc   . Hypertension   . Idiopathic peripheral neuropathy   . Pure hypercholesterolemia    Past Surgical History:  Procedure Laterality Date  . Arm surgery    . SHOULDER SURGERY       No outpatient medications have been marked as taking for the 11/23/18 encounter (Appointment) with Ledora Bottcher, [place].     Allergies:   Patient has no known allergies.   Social History   Tobacco Use  . Smoking status: Former Research scientist (life sciences)  . Smokeless tobacco: Never Used  Substance Use Topics  . Alcohol use: Yes    Comment: 14  .  Drug use: Not on file     Family Hx: The patient's family history includes Hypertension in his father and mother.  ROS:   Please see the history of present illness.    *** All other systems reviewed and are negative.   Prior CV studies:   The following studies were reviewed today:  ***  Labs/Other Tests and Data Reviewed:    EKG:  {EKG/Telemetry Strips Reviewed:[phone]}  Recent Labs: 10/20/2018: ALT 36; BUN 13; Creatinine, Ser 1.13; Potassium 4.5; Sodium 144   Recent Lipid Panel No results found for: CHOL, TRIG, HDL, CHOLHDL, LDLCALC, LDLDIRECT  Wt Readings from Last 3 Encounters:  10/20/18 217 lb (98.4 kg)  10/19/18 215 lb (97.5 kg)  02/12/16 225 lb (102.1 kg)     Objective:    Vital Signs:  There were no vitals taken for this visit.   {[facility]Care Virtual Exam (Optional):[phone]::"VITAL SIGNS:  reviewed"}  ASSESSMENT & PLAN:    1. ***  COVID-19 Education: The signs and symptoms of COVID-19 were discussed with the patient and how to seek care for testing (follow up with PCP or arrange E-visit).  ***The importance of social distancing was discussed today.  Time:   Today, I have spent *** minutes with the patient with telehealth technology discussing the above problems.     Medication Adjustments/Labs and Tests Ordered: Current medicines are reviewed at length with the patient today.  Concerns regarding medicines are outlined above.   Tests Ordered: No orders of the defined types were  placed in this encounter.   Medication Changes: No orders of the defined types were placed in this encounter.   Follow Up:  {F/U Format:[phone]} {follow up:15908}  Signed, Ledora Bottcher, PA  11/22/2018 9:48 PM    [facility]

## 2018-11-23 ENCOUNTER — Telehealth: Payer: Medicare HMO | Admitting: Physician Assistant

## 2018-11-24 ENCOUNTER — Other Ambulatory Visit: Payer: Self-pay

## 2018-11-24 ENCOUNTER — Encounter (HOSPITAL_BASED_OUTPATIENT_CLINIC_OR_DEPARTMENT_OTHER): Payer: Self-pay | Admitting: *Deleted

## 2018-11-24 ENCOUNTER — Inpatient Hospital Stay (HOSPITAL_BASED_OUTPATIENT_CLINIC_OR_DEPARTMENT_OTHER)
Admission: EM | Admit: 2018-11-24 | Discharge: 2018-12-04 | DRG: 234 | Disposition: A | Discharge status: Home / Self Care | Payer: Medicare HMO | Attending: Cardiothoracic Surgery | Admitting: Cardiothoracic Surgery

## 2018-11-24 DIAGNOSIS — I440 Atrioventricular block, first degree: Secondary | ICD-10-CM | POA: Diagnosis present

## 2018-11-24 DIAGNOSIS — I25110 Atherosclerotic heart disease of native coronary artery with unstable angina pectoris: Secondary | ICD-10-CM | POA: Diagnosis present

## 2018-11-24 DIAGNOSIS — E785 Hyperlipidemia, unspecified: Secondary | ICD-10-CM | POA: Diagnosis present

## 2018-11-24 DIAGNOSIS — Z7982 Long term (current) use of aspirin: Secondary | ICD-10-CM

## 2018-11-24 DIAGNOSIS — I200 Unstable angina: Secondary | ICD-10-CM | POA: Diagnosis not present

## 2018-11-24 DIAGNOSIS — Z951 Presence of aortocoronary bypass graft: Secondary | ICD-10-CM

## 2018-11-24 DIAGNOSIS — Z9889 Other specified postprocedural states: Secondary | ICD-10-CM

## 2018-11-24 DIAGNOSIS — G609 Hereditary and idiopathic neuropathy, unspecified: Secondary | ICD-10-CM | POA: Diagnosis present

## 2018-11-24 DIAGNOSIS — E876 Hypokalemia: Secondary | ICD-10-CM | POA: Diagnosis present

## 2018-11-24 DIAGNOSIS — R079 Chest pain, unspecified: Secondary | ICD-10-CM | POA: Diagnosis present

## 2018-11-24 DIAGNOSIS — I714 Abdominal aortic aneurysm, without rupture, unspecified: Secondary | ICD-10-CM | POA: Diagnosis present

## 2018-11-24 DIAGNOSIS — I2510 Atherosclerotic heart disease of native coronary artery without angina pectoris: Secondary | ICD-10-CM | POA: Diagnosis present

## 2018-11-24 DIAGNOSIS — F5104 Psychophysiologic insomnia: Secondary | ICD-10-CM | POA: Diagnosis present

## 2018-11-24 DIAGNOSIS — Z20828 Contact with and (suspected) exposure to other viral communicable diseases: Secondary | ICD-10-CM | POA: Diagnosis not present

## 2018-11-24 DIAGNOSIS — M5126 Other intervertebral disc displacement, lumbar region: Secondary | ICD-10-CM | POA: Diagnosis present

## 2018-11-24 DIAGNOSIS — I214 Non-ST elevation (NSTEMI) myocardial infarction: Secondary | ICD-10-CM | POA: Diagnosis not present

## 2018-11-24 DIAGNOSIS — Z79899 Other long term (current) drug therapy: Secondary | ICD-10-CM

## 2018-11-24 DIAGNOSIS — D696 Thrombocytopenia, unspecified: Secondary | ICD-10-CM | POA: Diagnosis not present

## 2018-11-24 DIAGNOSIS — E7800 Pure hypercholesterolemia, unspecified: Secondary | ICD-10-CM | POA: Diagnosis present

## 2018-11-24 DIAGNOSIS — I10 Essential (primary) hypertension: Secondary | ICD-10-CM | POA: Diagnosis present

## 2018-11-24 DIAGNOSIS — M549 Dorsalgia, unspecified: Secondary | ICD-10-CM | POA: Diagnosis not present

## 2018-11-24 DIAGNOSIS — I25119 Atherosclerotic heart disease of native coronary artery with unspecified angina pectoris: Secondary | ICD-10-CM | POA: Diagnosis present

## 2018-11-24 DIAGNOSIS — R001 Bradycardia, unspecified: Secondary | ICD-10-CM | POA: Diagnosis present

## 2018-11-24 DIAGNOSIS — Z8249 Family history of ischemic heart disease and other diseases of the circulatory system: Secondary | ICD-10-CM

## 2018-11-24 DIAGNOSIS — D62 Acute posthemorrhagic anemia: Secondary | ICD-10-CM | POA: Diagnosis not present

## 2018-11-24 DIAGNOSIS — Z87891 Personal history of nicotine dependence: Secondary | ICD-10-CM

## 2018-11-24 MED ORDER — NITROGLYCERIN 0.4 MG SL SUBL
0.4000 mg | SUBLINGUAL_TABLET | SUBLINGUAL | Status: DC | PRN
  Administered 2018-11-25 (×2): 0.4 mg via SUBLINGUAL

## 2018-11-24 MED ORDER — ASPIRIN 81 MG PO CHEW
CHEWABLE_TABLET | ORAL | Status: AC
  Administered 2018-11-25: 324 mg via ORAL
  Filled 2018-11-24: qty 4

## 2018-11-24 MED ORDER — NITROGLYCERIN 0.4 MG SL SUBL
SUBLINGUAL_TABLET | SUBLINGUAL | Status: AC
  Administered 2018-11-25: 0.4 mg via SUBLINGUAL
  Filled 2018-11-24: qty 3

## 2018-11-24 NOTE — ED Provider Notes (Signed)
[place] DEPT [facility] Provider Note: Georgena Spurling, MD, FACEP  CSN: [id number] MRN: [id number] ARRIVAL: 11/24/18 at [place]: [place]  Chest Pain   HISTORY OF PRESENT ILLNESS  11/24/18 11:59 PM Curtis Weiss is a 73 y.o. male who had the onset of precordial chest pain while lying in bed about 20 minutes prior to arrival.  He rates the pain as a 7 out of 10 and describes it as a pressure or something sitting on his chest.  It does radiate to his posterior right shoulder.  He has some mild associated shortness of breath and had some transient diaphoresis and nausea.  Nothing makes the pain better or worse.  He has had 2 previous episodes which only lasted about 5 minutes.  He has seen Dr. Stanford Breed for cardiology and has had an echocardiogram but no stress test or cardiac catheterization.  He takes 21 mg of aspirin daily.  He has a known AAA which he states is stable.   Past Medical History:  Diagnosis Date  . AAA (abdominal aortic aneurysm) without rupture ([place])   . Chronic insomnia   . Herniated lumbar intervertebral disc   . Hypertension   . Idiopathic peripheral neuropathy   . Pure hypercholesterolemia     Past Surgical History:  Procedure Laterality Date  . Arm surgery    . SHOULDER SURGERY      Family History  Problem Relation Age of Onset  . Hypertension Mother   . Hypertension Father     Social History   Tobacco Use  . Smoking status: Former Research scientist (life sciences)  . Smokeless tobacco: Never Used  Substance Use Topics  . Alcohol use: Yes    Comment: 14  . Drug use: Not on file    Prior to Admission medications   Medication Sig Start Date End Date Taking? Authorizing Provider  amLODipine (NORVASC) 5 MG tablet Take 5 mg by mouth 2 (two) times daily. 11/30/15   [provider]  aspirin EC 81 MG tablet Take 81 mg by mouth daily.    [provider]  doxazosin (CARDURA) 4 MG tablet Take 4 mg by mouth 2 (two) times daily. 01/24/16    [provider]  metoprolol (LOPRESSOR) 100 MG tablet Take 100 mg by mouth 2 (two) times daily.    [provider]  Multiple Vitamins-Minerals (MULTIVITAMIN PO) Take 1 tablet by mouth daily.    [provider]  quinapril (ACCUPRIL) 40 MG tablet Take 1 tablet (40 mg total) by mouth daily. 10/19/18   Lelon Perla, MD    Allergies Patient has no known allergies.   REVIEW OF SYSTEMS  Negative except as noted here or in the History of Present Illness.   PHYSICAL EXAMINATION  Initial Vital Signs Blood pressure 101/60, pulse 68, temperature 97.8 F (36.6 C), temperature source Oral, resp. rate 16, SpO2 97 %.  Examination General: Well-developed, well-nourished male in no acute distress; appearance consistent with age of record HENT: normocephalic; atraumatic Eyes: pupils equal, round and reactive to light; extraocular muscles intact Neck: supple Heart: regular rate and rhythm; no murmur Lungs: clear to auscultation bilaterally Abdomen: soft; nondistended; nontender; no tender or pulsatile mass palpated; bowel sounds present Extremities: No deformity; full range of motion; pulses normal Neurologic: Awake, alert and oriented; motor function intact in all extremities and symmetric; no facial droop Skin: Warm and dry Psychiatric: Normal mood and affect   RESULTS  Summary of this visit's results, reviewed by myself:  EKG Interpretation  Date/Time:  Wednesday November 24 2018 23:56:14 EDT Ventricular Rate:  84 PR Interval:    QRS Duration: 130 QT Interval:  385 QTC Calculation: 456 R Axis:   -5 Text Interpretation:  Sinus rhythm LVH with IVCD and secondary repol abnrm Baseline wander in lead(s) V3 V4 V5 No previous ECGs available Confirmed by Shanon Rosser [phone]) on 11/24/2018 11:59:23 PM      Laboratory Studies: Results for orders placed or performed during the hospital encounter of 11/24/18 (from the past 24 hour(s))  Comprehensive metabolic  panel     Status: Abnormal   Collection Time: 11/25/18 12:01 AM  Result Value Ref Range   Sodium 142 135 - 145 mmol/L   Potassium 3.4 (L) 3.5 - 5.1 mmol/L   Chloride 108 98 - 111 mmol/L   CO2 21 (L) 22 - 32 mmol/L   Glucose, Bld 120 (H) 70 - 99 mg/dL   BUN 19 8 - 23 mg/dL   Creatinine, Ser 1.12 0.61 - 1.24 mg/dL   Calcium 9.4 8.9 - 10.3 mg/dL   Total Protein 8.3 (H) 6.5 - 8.1 g/dL   Albumin 4.9 3.5 - 5.0 g/dL   AST 26 15 - 41 U/L   ALT 28 0 - 44 U/L   Alkaline Phosphatase 69 38 - 126 U/L   Total Bilirubin 0.8 0.3 - 1.2 mg/dL   GFR calc non Af Amer >60 >60 mL/min   GFR calc Af Amer >60 >60 mL/min   Anion gap 13 5 - 15  CBC with Differential     Status: Abnormal   Collection Time: 11/25/18 12:01 AM  Result Value Ref Range   WBC 8.2 4.0 - 10.5 K/uL   RBC 4.83 4.22 - 5.81 MIL/uL   Hemoglobin 16.9 13.0 - 17.0 g/dL   HCT 47.8 39.0 - 52.0 %   MCV 99.0 80.0 - 100.0 fL   MCH 35.0 (H) 26.0 - 34.0 pg   MCHC 35.4 30.0 - 36.0 g/dL   RDW 12.3 11.5 - 15.5 %   Platelets 149 (L) 150 - 400 K/uL   nRBC 0.0 0.0 - 0.2 %   Neutrophils Relative % 48 %   Neutro Abs 3.9 1.7 - 7.7 K/uL   Lymphocytes Relative 36 %   Lymphs Abs 2.9 0.7 - 4.0 K/uL   Monocytes Relative 11 %   Monocytes Absolute 0.9 0.1 - 1.0 K/uL   Eosinophils Relative 4 %   Eosinophils Absolute 0.3 0.0 - 0.5 K/uL   Basophils Relative 1 %   Basophils Absolute 0.1 0.0 - 0.1 K/uL   Immature Granulocytes 0 %   Abs Immature Granulocytes 0.03 0.00 - 0.07 K/uL  Troponin I (High Sensitivity)     Status: None   Collection Time: 11/25/18 12:01 AM  Result Value Ref Range   Troponin I (High Sensitivity) 17 <18 ng/L  Troponin I (High Sensitivity)     Status: Abnormal   Collection Time: 11/25/18  1:49 AM  Result Value Ref Range   Troponin I (High Sensitivity) 44 (H) <18 ng/L   Imaging Studies: Dg Chest Port 1 View  Result Date: 11/25/2018 CLINICAL DATA:  Left chest pain EXAM: PORTABLE CHEST 1 VIEW COMPARISON:  None. FINDINGS: The  heart size and mediastinal contours are within normal limits. Both lungs are clear. The visualized skeletal structures are unremarkable. IMPRESSION: No active disease. Electronically Signed   By: Monte Fantasia M.D.   On: 11/25/2018 04:14    ED COURSE and MDM  Nursing  notes and initial vitals signs, including pulse oximetry, reviewed.  Vitals:   11/25/18 0200 11/25/18 0230 11/25/18 0300 11/25/18 0330  BP: (!) 142/69 128/66 132/67 (!) 143/71  Pulse: (!) 56 (!) 54 (!) 55 (!) 50  Resp: 16 20 20 14   Temp:      TempSrc:      SpO2: 97% 96% 96% 98%    12:02 AM Aspirin 324 mg given.  Sublingual nitroglycerin x3 ordered.   1:27 AM Asymptomatic after nitroglycerin sublingually.  2:36 AM Patient has had an increase in troponin.  We will have him admitted to the hospitalist service.  4:21 AM Awaiting transfer to [facility]. Dr. Hal Hope accepts to Hospitalist Service.  PROCEDURES    ED DIAGNOSES     ICD-10-CM   1. Unstable angina (HCC)  I20.0        Jaritza Duignan, MD 11/25/18 [phone]

## 2018-11-24 NOTE — ED Triage Notes (Signed)
Pt c/o left sided chest pain , nausea and SOb x 20 mins ago

## 2018-11-25 ENCOUNTER — Observation Stay (HOSPITAL_BASED_OUTPATIENT_CLINIC_OR_DEPARTMENT_OTHER): Payer: Medicare HMO

## 2018-11-25 ENCOUNTER — Encounter (HOSPITAL_COMMUNITY): Payer: Self-pay | Admitting: *Deleted

## 2018-11-25 DIAGNOSIS — E7800 Pure hypercholesterolemia, unspecified: Secondary | ICD-10-CM

## 2018-11-25 DIAGNOSIS — R079 Chest pain, unspecified: Secondary | ICD-10-CM | POA: Diagnosis not present

## 2018-11-25 DIAGNOSIS — R072 Precordial pain: Secondary | ICD-10-CM | POA: Diagnosis not present

## 2018-11-25 DIAGNOSIS — T50905A Adverse effect of unspecified drugs, medicaments and biological substances, initial encounter: Secondary | ICD-10-CM | POA: Diagnosis not present

## 2018-11-25 DIAGNOSIS — M792 Neuralgia and neuritis, unspecified: Secondary | ICD-10-CM | POA: Diagnosis not present

## 2018-11-25 DIAGNOSIS — I214 Non-ST elevation (NSTEMI) myocardial infarction: Principal | ICD-10-CM

## 2018-11-25 DIAGNOSIS — I10 Essential (primary) hypertension: Secondary | ICD-10-CM | POA: Diagnosis not present

## 2018-11-25 DIAGNOSIS — I495 Sick sinus syndrome: Secondary | ICD-10-CM | POA: Diagnosis not present

## 2018-11-25 DIAGNOSIS — R001 Bradycardia, unspecified: Secondary | ICD-10-CM | POA: Diagnosis not present

## 2018-11-25 LAB — CBC WITH DIFFERENTIAL/PLATELET
Abs Immature Granulocytes: 0.03 10*3/uL (ref 0.00–0.07)
Basophils Absolute: 0.1 10*3/uL (ref 0.0–0.1)
Basophils Relative: 1 %
Eosinophils Absolute: 0.3 10*3/uL (ref 0.0–0.5)
Eosinophils Relative: 4 %
HCT: 47.8 % (ref 39.0–52.0)
Hemoglobin: 16.9 g/dL (ref 13.0–17.0)
Immature Granulocytes: 0 %
Lymphocytes Relative: 36 %
Lymphs Abs: 2.9 10*3/uL (ref 0.7–4.0)
MCH: 35 pg — ABNORMAL HIGH (ref 26.0–34.0)
MCHC: 35.4 g/dL (ref 30.0–36.0)
MCV: 99 fL (ref 80.0–100.0)
Monocytes Absolute: 0.9 10*3/uL (ref 0.1–1.0)
Monocytes Relative: 11 %
Neutro Abs: 3.9 10*3/uL (ref 1.7–7.7)
Neutrophils Relative %: 48 %
Platelets: 149 10*3/uL — ABNORMAL LOW (ref 150–400)
RBC: 4.83 MIL/uL (ref 4.22–5.81)
RDW: 12.3 % (ref 11.5–15.5)
WBC: 8.2 10*3/uL (ref 4.0–10.5)
nRBC: 0 % (ref 0.0–0.2)

## 2018-11-25 LAB — APTT: aPTT: 43 seconds — ABNORMAL HIGH (ref 24–36)

## 2018-11-25 LAB — COMPREHENSIVE METABOLIC PANEL
ALT: 28 U/L (ref 0–44)
AST: 26 U/L (ref 15–41)
Albumin: 4.9 g/dL (ref 3.5–5.0)
Alkaline Phosphatase: 69 U/L (ref 38–126)
Anion gap: 13 (ref 5–15)
BUN: 19 mg/dL (ref 8–23)
CO2: 21 mmol/L — ABNORMAL LOW (ref 22–32)
Calcium: 9.4 mg/dL (ref 8.9–10.3)
Chloride: 108 mmol/L (ref 98–111)
Creatinine, Ser: 1.12 mg/dL (ref 0.61–1.24)
GFR calc Af Amer: >60 mL/min (ref >60)
GFR calc non Af Amer: >60 mL/min (ref >60)
Glucose, Bld: 120 mg/dL — ABNORMAL HIGH (ref 70–99)
Potassium: 3.4 mmol/L — ABNORMAL LOW (ref 3.5–5.1)
Sodium: 142 mmol/L (ref 135–145)
Total Bilirubin: 0.8 mg/dL (ref 0.3–1.2)
Total Protein: 8.3 g/dL — ABNORMAL HIGH (ref 6.5–8.1)

## 2018-11-25 LAB — HEMOGLOBIN A1C
Hgb A1c MFr Bld: 5.2 % (ref 4.8–5.6)
Mean Plasma Glucose: 102.54 mg/dL

## 2018-11-25 LAB — TROPONIN I (HIGH SENSITIVITY)
Troponin I (High Sensitivity): 108 ng/L (ref <18)
Troponin I (High Sensitivity): 17 ng/L (ref <18)
Troponin I (High Sensitivity): 44 ng/L — ABNORMAL HIGH (ref <18)
Troponin I (High Sensitivity): 91 ng/L — ABNORMAL HIGH (ref <18)

## 2018-11-25 LAB — CBC
HCT: 42.6 % (ref 39.0–52.0)
Hemoglobin: 15.1 g/dL (ref 13.0–17.0)
MCH: 35.1 pg — ABNORMAL HIGH (ref 26.0–34.0)
MCHC: 35.4 g/dL (ref 30.0–36.0)
MCV: 99.1 fL (ref 80.0–100.0)
Platelets: 142 10*3/uL — ABNORMAL LOW (ref 150–400)
RBC: 4.3 MIL/uL (ref 4.22–5.81)
RDW: 12.6 % (ref 11.5–15.5)
WBC: 5.9 10*3/uL (ref 4.0–10.5)
nRBC: 0 % (ref 0.0–0.2)

## 2018-11-25 LAB — CREATININE, SERUM
Creatinine, Ser: 1.05 mg/dL (ref 0.61–1.24)
GFR calc Af Amer: >60 mL/min (ref >60)
GFR calc non Af Amer: >60 mL/min (ref >60)

## 2018-11-25 LAB — PROTIME-INR
INR: 1.1 (ref 0.8–1.2)
Prothrombin Time: 13.7 seconds (ref 11.4–15.2)

## 2018-11-25 LAB — SARS CORONAVIRUS 2 (TAT 6-24 HRS): SARS Coronavirus 2: NEGATIVE

## 2018-11-25 LAB — MAGNESIUM: Magnesium: 2.4 mg/dL (ref 1.7–2.4)

## 2018-11-25 LAB — TSH: TSH: 1.468 u[IU]/mL (ref 0.350–4.500)

## 2018-11-25 LAB — PHOSPHORUS: Phosphorus: 3.2 mg/dL (ref 2.5–4.6)

## 2018-11-25 LAB — CK: Total CK: 126 U/L (ref 49–397)

## 2018-11-25 MED ORDER — ADULT MULTIVITAMIN W/MINERALS CH
1.0000 | ORAL_TABLET | Freq: Every day | ORAL | Status: DC
  Administered 2018-11-25 – 2018-12-04 (×9): 1 via ORAL
  Filled 2018-11-25 (×9): qty 1

## 2018-11-25 MED ORDER — LORAZEPAM 1 MG PO TABS: 1.0000 mg | ORAL_TABLET | ORAL | Status: DC | PRN

## 2018-11-25 MED ORDER — ASPIRIN EC 81 MG PO TBEC
81.0000 mg | DELAYED_RELEASE_TABLET | Freq: Every day | ORAL | Status: DC
  Administered 2018-11-26 – 2018-11-29 (×4): 81 mg via ORAL
  Filled 2018-11-25 (×4): qty 1

## 2018-11-25 MED ORDER — POTASSIUM CHLORIDE CRYS ER 20 MEQ PO TBCR
20.0000 meq | EXTENDED_RELEASE_TABLET | Freq: Once | ORAL | Status: AC
  Administered 2018-11-25: 20 meq via ORAL
  Filled 2018-11-25: qty 1

## 2018-11-25 MED ORDER — METOPROLOL TARTRATE 100 MG PO TABS: 100.0000 mg | ORAL_TABLET | Freq: Two times a day (BID) | ORAL | Status: DC

## 2018-11-25 MED ORDER — ENOXAPARIN SODIUM 40 MG/0.4ML ~~LOC~~ SOLN
40.0000 mg | SUBCUTANEOUS | Status: DC
  Administered 2018-11-25: 40 mg via SUBCUTANEOUS
  Filled 2018-11-25: qty 0.4

## 2018-11-25 MED ORDER — ATORVASTATIN CALCIUM 80 MG PO TABS
80.0000 mg | ORAL_TABLET | Freq: Every day | ORAL | Status: DC
  Administered 2018-11-25 – 2018-12-03 (×8): 80 mg via ORAL
  Filled 2018-11-25 (×8): qty 1

## 2018-11-25 MED ORDER — LORAZEPAM 2 MG/ML IJ SOLN: 1.0000 mg | INTRAMUSCULAR | Status: DC | PRN

## 2018-11-25 MED ORDER — FOLIC ACID 1 MG PO TABS
1.0000 mg | ORAL_TABLET | Freq: Every day | ORAL | Status: DC
  Administered 2018-11-25 – 2018-12-04 (×9): 1 mg via ORAL
  Filled 2018-11-25 (×9): qty 1

## 2018-11-25 MED ORDER — ASPIRIN 81 MG PO CHEW
324.0000 mg | CHEWABLE_TABLET | Freq: Once | ORAL | Status: AC
  Administered 2018-11-25: 324 mg via ORAL

## 2018-11-25 MED ORDER — METOPROLOL TARTRATE 50 MG PO TABS: 50.0000 mg | ORAL_TABLET | Freq: Two times a day (BID) | ORAL | Status: DC

## 2018-11-25 MED ORDER — QUINAPRIL HCL 10 MG PO TABS: 40.0000 mg | ORAL_TABLET | Freq: Every day | ORAL | Status: DC

## 2018-11-25 MED ORDER — METOPROLOL TARTRATE 12.5 MG HALF TABLET
12.5000 mg | ORAL_TABLET | Freq: Four times a day (QID) | ORAL | Status: DC
  Filled 2018-11-25 (×2): qty 1

## 2018-11-25 MED ORDER — ASPIRIN EC 325 MG PO TBEC: 325.0000 mg | DELAYED_RELEASE_TABLET | Freq: Every day | ORAL | Status: DC

## 2018-11-25 MED ORDER — AMLODIPINE BESYLATE 5 MG PO TABS
5.0000 mg | ORAL_TABLET | Freq: Every day | ORAL | Status: DC
  Administered 2018-11-26 – 2018-11-29 (×4): 5 mg via ORAL
  Filled 2018-11-25 (×4): qty 1

## 2018-11-25 MED ORDER — LISINOPRIL 20 MG PO TABS
40.0000 mg | ORAL_TABLET | Freq: Every day | ORAL | Status: DC
  Administered 2018-11-26 – 2018-11-29 (×4): 40 mg via ORAL
  Filled 2018-11-25 (×4): qty 1

## 2018-11-25 MED ORDER — HYDRALAZINE HCL 20 MG/ML IJ SOLN: 10.0000 mg | Freq: Four times a day (QID) | INTRAMUSCULAR | Status: DC | PRN

## 2018-11-25 MED ORDER — AMLODIPINE BESYLATE 5 MG PO TABS: 5.0000 mg | ORAL_TABLET | Freq: Two times a day (BID) | ORAL | Status: DC

## 2018-11-25 MED ORDER — HEPARIN (PORCINE) 25000 UT/250ML-% IV SOLN
1150.0000 [IU]/h | INTRAVENOUS | Status: DC
  Administered 2018-11-25: 1150 [IU]/h via INTRAVENOUS
  Filled 2018-11-25: qty 250

## 2018-11-25 MED ORDER — DOXAZOSIN MESYLATE 4 MG PO TABS
4.0000 mg | ORAL_TABLET | Freq: Two times a day (BID) | ORAL | Status: DC
  Administered 2018-11-25 – 2018-11-29 (×9): 4 mg via ORAL
  Filled 2018-11-25 (×11): qty 1

## 2018-11-25 MED ORDER — VITAMIN B-1 100 MG PO TABS
100.0000 mg | ORAL_TABLET | Freq: Every day | ORAL | Status: DC
  Administered 2018-11-25 – 2018-12-04 (×9): 100 mg via ORAL
  Filled 2018-11-25 (×9): qty 1

## 2018-11-25 MED ORDER — THIAMINE HCL 100 MG/ML IJ SOLN
100.0000 mg | Freq: Every day | INTRAMUSCULAR | Status: DC
  Filled 2018-11-25: qty 2

## 2018-11-25 NOTE — Progress Notes (Addendum)
ANTICOAGULATION CONSULT NOTE - Initial Consult  Pharmacy Consult for Heparin Indication: ACS/NSTEMI  No Known Allergies  Patient Measurements: Height: 6' (182.9 cm) Weight: 212 lb 4.8 oz (96.3 kg) IBW/kg (Calculated) : 77.6 Heparin Dosing Weight: 96.3  Vital Signs: Temp: 97.8 F (36.6 C) (10/22 1450) Temp Source: Oral (10/22 1450) BP: 156/66 (10/22 1450) Pulse Rate: 44 (10/22 1450)  Labs: Recent Labs    11/25/18 0001 11/25/18 0149 11/25/18 1311  HGB 16.9  --  15.1  HCT 47.8  --  42.6  PLT 149*  --  142*  CREATININE 1.12  --  1.05  TROPONINIHS 17 44* 108*    Estimated Creatinine Clearance: 75.4 mL/min (by C-G formula based on SCr of 1.05 mg/dL).   Medical History: Past Medical History:  Diagnosis Date  . AAA (abdominal aortic aneurysm) without rupture ([place])   . Chronic insomnia   . Herniated lumbar intervertebral disc   . Hypertension   . Idiopathic peripheral neuropathy   . Pure hypercholesterolemia     Assessment: 73 yr old male admitted with NSTEMI to start on IV heparin. He is  on no anticoagulants prior to admission. Pt rec'd Lovenox 40 mg SQ X 1 at 15:13 this afternoon. Left heart cath planned for 10/23.  H/H WNL; platelet count 142  Medical history includes: HTN, sinus bradycardia, hypercholesterolemia, atherosclerosis, known AAA  Goal of Therapy:  Heparin level 0.3-0.7 units/ml Monitor platelets by anticoagulation protocol: Yes   Plan:  Omit heparin bolus since pt rec'd Lovenox 40 mg SQ X 1 approx 30 minutes ago Start heparin infusion at 1150 units/hr Check 8-hr HL, then daily Monitor CBC daily Monitor for signs/symptoms of bleeding  Gillermina Hu, PharmD, BCPS, [facility] Clinical Pharmacist 11/25/2018,3:36 PM

## 2018-11-25 NOTE — H&P (Signed)
History and Physical    DOA: 11/24/2018  PCP: Drake Leach, MD  Patient coming from: home-->[facility]  Chief Complaint: Chest pain episodes  HPI: Curtis Weiss is a 73 y.o. male with history h/o abdominal aortic aneurysm, DDD, hypertension, hypercholesterolemia, shoulder surgery, peripheral neuropathy presented to [facility] last night with complaints of chest pain-left-sided heavy sensation, like someone sitting on his chest, 8/10, associated with dull achy pain in both upper arms and sweating.  Patient reports similar episode past weekend lasting 15 minutes which was also associated with nausea. Patient reports 2 other episodes in the last month.  The first episode happened in first week of September when he was at the beach and felt sudden onset of sharp, achy pain in bilateral arms followed by vomiting x3.  He also felt dizzy during this episode.  He was evaluated by cardiology APP for this episode, an echocardiogram was obtained which was within normal limits and he was advised no further work-up.  Patient states on October 3, he had the second [place] exertional dyspnea lasting 20 minutes associated with some sweating but no nausea or vomiting.  Patient takes 81 mg daily as preventive measure and Lopressor 100 mg twice daily for blood pressure but denies any history of heart disease.  He states his heart rate always runs low (currently telemetry showing heart rate 40-45) at rest as well as exertion.  He denies any history of smoking or using drugs but admits to drinking 2-3 shots of vodka with dinner.  He denies undergoing stress test or cardiac cath in the past.  He gives family history of CAD in his brother who underwent open heart surgery  3 years back.  He is currently asymptomatic and able to provide above history in detail. Patient received aspirin 325 mg around midnight, sublingual nitro and potassium 20 mg at [facility] and also took home dose  of aspirin 81 mg / 100 mg metoprolol and Norvasc/lisinopril this morning (apparently wife brought in home meds).  EKG with normal sinus rhythm/nonspecific ST changes/IVCD and troponin x2 slightly up (17-44) patient denies any history of fever, cough or sick contacts.  Review of Systems: As per HPI otherwise 10 point review of systems negative.    Past Medical History:  Diagnosis Date   AAA (abdominal aortic aneurysm) without rupture (HCC)    Chronic insomnia    Herniated lumbar intervertebral disc    Hypertension    Idiopathic peripheral neuropathy    Pure hypercholesterolemia     Past Surgical History:  Procedure Laterality Date   Arm surgery     SHOULDER SURGERY      Social history:  reports that he has quit smoking. He has never used smokeless tobacco. He reports current alcohol use. No history on file for drug.   No Known Allergies  Family History  Problem Relation Age of Onset   Hypertension Mother    Hypertension Father       Prior to Admission medications   Medication Sig Start Date End Date Taking? Authorizing Provider  amLODipine (NORVASC) 5 MG tablet Take 5 mg by mouth 2 (two) times daily. 11/30/15   [provider]  aspirin EC 81 MG tablet Take 81 mg by mouth daily.    [provider]  doxazosin (CARDURA) 4 MG tablet Take 4 mg by mouth 2 (two) times daily. 01/24/16   [provider]  metoprolol (LOPRESSOR) 100 MG tablet Take 100 mg  by mouth 2 (two) times daily.    [provider]  Multiple Vitamins-Minerals (MULTIVITAMIN PO) Take 1 tablet by mouth daily.    [provider]  quinapril (ACCUPRIL) 40 MG tablet Take 1 tablet (40 mg total) by mouth daily. 10/19/18   Lelon Perla, MD    Physical Exam: Vitals:   11/25/18 0712 11/25/18 0921 11/25/18 1000 11/25/18 1147  BP: 132/78 (!) 142/75 (!) 141/65 140/66  Pulse: (!) 56 (!) 48 (!) 37   Resp: 18 16 16    Temp: 98.4 F (36.9 C)   (!) 97.5 F (36.4 C)    TempSrc: Oral     SpO2: 98% 97% 97% 98%  Weight:    96.3 kg  Height:    6' (1.829 m)    Constitutional: NAD, calm, comfortable Eyes: PERRL, lids and conjunctivae normal ENMT: Mucous membranes are moist. Posterior pharynx clear of any exudate or lesions.Normal dentition.  Neck: normal, supple, no masses, no thyromegaly Respiratory: clear to auscultation bilaterally, no wheezing, no crackles. Normal respiratory effort. No accessory muscle use.  Cardiovascular: Regular rate and rhythm, no murmurs / rubs / gallops. No extremity edema. 2+ pedal pulses. No carotid bruits.  Abdomen: No tenderness, no masses palpated. No hepatosplenomegaly. Bowel sounds positive.  Musculoskeletal: no clubbing / cyanosis. No joint deformity upper and lower extremities. Good ROM, no contractures. Normal muscle tone.  Neurologic: CN 2-12 grossly intact. Sensation intact, DTR normal. Strength 5/5 in all 4.  Psychiatric: Normal judgment and insight. Alert and oriented x 3. Normal mood.  SKIN/catheters: no rashes, lesions, ulcers. No induration  Labs on Admission: I have personally reviewed following labs and imaging studies  CBC: Recent Labs  Lab 11/25/18 0001  WBC 8.2  NEUTROABS 3.9  HGB 16.9  HCT 47.8  MCV 99.0  PLT 123456*   Basic Metabolic Panel: Recent Labs  Lab 11/25/18 0001  NA 142  K 3.4*  CL 108  CO2 21*  GLUCOSE 120*  BUN 19  CREATININE 1.12  CALCIUM 9.4   GFR: Estimated Creatinine Clearance: 70.7 mL/min (by C-G formula based on SCr of 1.12 mg/dL). Liver Function Tests: Recent Labs  Lab 11/25/18 0001  AST 26  ALT 28  ALKPHOS 69  BILITOT 0.8  PROT 8.3*  ALBUMIN 4.9   No results for input(s): LIPASE, AMYLASE in the last 168 hours. No results for input(s): AMMONIA in the last 168 hours. Coagulation Profile: No results for input(s): INR, PROTIME in the last 168 hours. Cardiac Enzymes: No results for input(s): CKTOTAL, CKMB, CKMBINDEX, TROPONINI in the last 168 hours. BNP (last 3  results) No results for input(s): PROBNP in the last 8760 hours. HbA1C: No results for input(s): HGBA1C in the last 72 hours. CBG: No results for input(s): GLUCAP in the last 168 hours. Lipid Profile: No results for input(s): CHOL, HDL, LDLCALC, TRIG, CHOLHDL, LDLDIRECT in the last 72 hours. Thyroid Function Tests: No results for input(s): TSH, T4TOTAL, FREET4, T3FREE, THYROIDAB in the last 72 hours. Anemia Panel: No results for input(s): VITAMINB12, FOLATE, FERRITIN, TIBC, IRON, RETICCTPCT in the last 72 hours. Urine analysis: No results found for: COLORURINE, APPEARANCEUR, LABSPEC, [place], GLUCOSEU, [place], BILIRUBINUR, KETONESUR, PROTEINUR, UROBILINOGEN, NITRITE, LEUKOCYTESUR  Radiological Exams on Admission: Personally reviewed  Dg Chest Port 1 View  Result Date: 11/25/2018 CLINICAL DATA:  Left chest pain EXAM: PORTABLE CHEST 1 VIEW COMPARISON:  None. FINDINGS: The heart size and mediastinal contours are within normal limits. Both lungs are clear. The visualized skeletal structures are unremarkable. IMPRESSION:  No active disease. Electronically Signed   By: Monte Fantasia M.D.   On: 11/25/2018 04:14    EKG: Independently reviewed.  Normal sinus rhythm with IVCD and nonspecific ST-T changes in V1 V2.  Repeat EKG pending  Assessment and Plan:   Active Problems:   Chest pain    1.  Episodic chest pain: Concern for ACS versus symptomatic bradycardia.  Recent echocardiogram from 9/22 shows mild LVH otherwise within normal limits with no wall motion abnormalities and intact EF.  Resumed aspirin daily.  Cardiology evaluating patient now.  Will reduce Lopressor dose to 50 mg with holding parameters (SBP less than 100, heart rate less than 55) for now.  Defer need for heparin drip/plan for stress test to cardiology (may need to hold beta-blockers for stress test).  Will obtain another set of cardiac enzymes.  Repeat EKG to rule out ST-T changes as well as AV blocks.  He does have evidence  of intraventricular conduction delay on initial EKG.  Nitro sublingual available for further chest pain episodes.  Obtain lipid profile and add statins as needed  2.  Hypertension: Reduce metoprolol dosage and concern for bradycardia as well as IVCD on EKG.  Resume Norvasc, lisinopril.  Can add hydralazine for blood pressure control if needed.  3.  Sinus bradycardia: In the setting of high doses of beta-blockers.  May need to check exertional response to heart rate by walking patient.  Check TSH.  Replace potassium  4.  Upper arm cramping: Secondary to neuropathy versus hypokalemia.  Check magnesium/phosphorus/CK levels.  Can try Neurontin if he has recurrent/persistent symptoms  5.  Mild hypokalemia: Replace  6.  Hyperlipidemia: Not on any meds at home.  Repeat lipid profile in a.m.  7.  Abdominal aortic aneurysm: Around 3 cm and unchanged since prior exam.  Follow-up as outpatient.  DVT prophylaxis: Lovenox  COVID screen: Pending results  Code Status: Full code   .Health care proxy would be his wife  Patient/Family Communication: Discussed with patient and all questions answered to satisfaction.  Consults called: Cardiology Admission status : Observation as anticipated length of stay less than 2 midnights Expected LOS: 1 day unless has abnormal stress test and further cardiac investigation warranted    Guilford Shi MD [facility] Pager [phone]  If 7PM-7AM, please contact night-coverage [email or web address] Password [email or web address]  11/25/2018, 12:19 PM

## 2018-11-25 NOTE — Consult Note (Signed)
Cardiology Consultation:   Patient ID: Curtis Weiss MRN: [id number]; DOB: 08-May-1945  Admit date: 11/24/2018 Date of Consult: 11/25/2018  Primary Care Provider: Drake Leach, MD Primary Cardiologist: Kirk Ruths, MD    Patient Profile:   Curtis Weiss is a 73 y.o. male with a hx of HTN who is being seen today for the evaluation of CP at the request of Dr Earnest Conroy  History of Present Illness:   Curtis Weiss is a 73 yo with no known CAD     The pt says on Labor Day with chest pressure   Eased in about 15 min    First of October he was golfing   Developed chest pressure   Slowed and sat down   Lasted about 15 min   SOB with episode   He resumed playing  Saturday watching football   Developed CP   Assocated with Nausea and vomiting Yesterday getting ready for bed.   Laid down   Chet pressure started  Like elephant on chest   Arm pain bilaterally   Associated SOB   Eased off in about 15 min  Pt currently has minimal chest pressure   No N/  No arm pain  Breathing is OK   Heart Pathway Score:  HEAR Score: 7  Past Medical History:  Diagnosis Date  . AAA (abdominal aortic aneurysm) without rupture ([place])   . Chronic insomnia   . Herniated lumbar intervertebral disc   . Hypertension   . Idiopathic peripheral neuropathy   . Pure hypercholesterolemia     Past Surgical History:  Procedure Laterality Date  . Arm surgery    . SHOULDER SURGERY         Inpatient Medications: Scheduled Meds: . amLODipine  5 mg Oral Daily  . [START ON 11/26/2018] aspirin EC  325 mg Oral Daily  . doxazosin  4 mg Oral BID  . enoxaparin (LOVENOX) injection  40 mg Subcutaneous Q24H  . [START ON 11/26/2018] lisinopril  40 mg Oral Daily  . metoprolol tartrate  50 mg Oral BID  . potassium chloride  20 mEq Oral Once   Continuous Infusions:  PRN Meds: hydrALAZINE, nitroGLYCERIN  Allergies:   No Known Allergies  Social History:   Social History   Socioeconomic History  . Marital status: Married     Spouse name: Not on file  . Number of children: 3  . Years of education: Not on file  . Highest education level: Not on file  Occupational History    Comment: Retired  Scientific laboratory technician  . Financial resource strain: Not on file  . Food insecurity    Worry: Not on file    Inability: Not on file  . Transportation needs    Medical: Not on file    Non-medical: Not on file  Tobacco Use  . Smoking status: Former Research scientist (life sciences)  . Smokeless tobacco: Never Used  Substance and Sexual Activity  . Alcohol use: Yes    Comment: 14  . Drug use: Not on file  . Sexual activity: Not on file  Lifestyle  . Physical activity    Days per week: Not on file    Minutes per session: Not on file  . Stress: Not at all  Relationships  . Social Herbalist on phone: Not on file    Gets together: Not on file    Attends religious service: Not on file    Active member of club or organization: Not on file  Attends meetings of clubs or organizations: Not on file    Relationship status: Not on file  . Intimate partner violence    Fear of current or ex partner: Not on file    Emotionally abused: Not on file    Physically abused: Not on file    Forced sexual activity: Not on file  Other Topics Concern  . Not on file  Social History Narrative  . Not on file    Family History:   Bother had CABC   Family History  Problem Relation Age of Onset  . Hypertension Mother   . Hypertension Father      ROS:  Please see the history of present illness.  All other ROS reviewed and negative.     Physical Exam/Data:   Vitals:   11/25/18 0921 11/25/18 1000 11/25/18 1147 11/25/18 1450  BP: (!) 142/75 (!) 141/65 140/66 (!) 156/66  Pulse: (!) 48 (!) 37  (!) 44  Resp: 16 16 18    Temp:   (!) 97.5 F (36.4 C) 97.8 F (36.6 C)  TempSrc:   Oral Oral  SpO2: 97% 97% 98%   Weight:   96.3 kg   Height:   6' (1.829 m)    No intake or output data in the 24 hours ending 11/25/18 1459 Last 3 Weights 11/25/2018  10/20/2018 10/19/2018  Weight (lbs) 212 lb 4.8 oz 217 lb 215 lb  Weight (kg) 96.299 kg 98.431 kg 97.523 kg     Body mass index is 28.79 kg/m.  General:  Well nourished, well developed, in no acute distress HEENT: normal Lymph: no adenopathy Neck: no JVD Endocrine:  No thryomegaly Vascular: No carotid bruits; FA pulses 2+ bilaterally without bruits  Cardiac:  normal S1, S2; RRR; no murmur  Lungs:  clear to auscultation bilaterally, no wheezing, rhonchi or rales  Abd: soft, nontender, no hepatomegaly  Ext: no edema Musculoskeletal:  No deformities, BUE and BLE strength normal and equal Skin: warm and dry  Neuro:  CNs 2-12 intact, no focal abnormalities noted Psych:  Normal affect   EKG:  The EKG was personally reviewed and demonstrates:  SR 84 bpm   LVH  Nonspecific ST changes  Telemetry:  Telemetry was personally reviewed and demonstrates:  SR  Relevant CV Studies:  ECHO:   Sept 2019  1. Left ventricular ejection fraction, by visual estimation, is 60 to 65%. The left ventricle has normal function. Normal left ventricular size. There is mildly increased left ventricular hypertrophy. 2. Left ventricular diastolic Doppler parameters are consistent with impaired relaxation pattern of LV diastolic filling. 3. Global right ventricle has normal systolic function.The right ventricular size is normal. No increase in right ventricular wall thickness. 4. Left atrial size was moderately dilated. 5. Right atrial size was normal. 6. The mitral valve is normal in structure. Mild mitral valve regurgitation. No evidence of mitral stenosis. 7. The tricuspid valve is normal in structure. Tricuspid valve regurgitation is mild. 8. The aortic valve is normal in structure. Aortic valve regurgitation was not visualized by color flow Doppler. Mild to moderate aortic valve sclerosis/calcification without any evidence of aortic stenosis. 9. The pulmonic valve was normal in structure. Pulmonic valve  regurgitation is not visualized by color flow Doppler. 10. Mildly elevated pulmonary artery systolic pressure. 11. The inferior vena cava is normal in size with greater than 50% respiratory variability, suggesting right atrial pressure of 3 mmHg. 12. The average left ventricular global longitudinal strain is -18.3 %.  Laboratory Data:  High  Sensitivity Troponin:   Recent Labs  Lab 11/25/18 0001 11/25/18 0149 11/25/18 1311  TROPONINIHS 17 44* 108*     Chemistry Recent Labs  Lab 11/25/18 0001 11/25/18 1311  NA 142  --   K 3.4*  --   CL 108  --   CO2 21*  --   GLUCOSE 120*  --   BUN 19  --   CREATININE 1.12 1.05  CALCIUM 9.4  --   GFRNONAA >60 >60  GFRAA >60 >60  ANIONGAP 13  --     Recent Labs  Lab 11/25/18 0001  PROT 8.3*  ALBUMIN 4.9  AST 26  ALT 28  ALKPHOS 69  BILITOT 0.8   Hematology Recent Labs  Lab 11/25/18 0001 11/25/18 1311  WBC 8.2 5.9  RBC 4.83 4.30  HGB 16.9 15.1  HCT 47.8 42.6  MCV 99.0 99.1  MCH 35.0* 35.1*  MCHC 35.4 35.4  RDW 12.3 12.6  PLT 149* 142*   BNPNo results for input(s): BNP, PROBNP in the last 168 hours.  DDimer No results for input(s): DDIMER in the last 168 hours.   Radiology/Studies:  Dg Chest Port 1 View  Result Date: 11/25/2018 CLINICAL DATA:  Left chest pain EXAM: PORTABLE CHEST 1 VIEW COMPARISON:  None. FINDINGS: The heart size and mediastinal contours are within normal limits. Both lungs are clear. The visualized skeletal structures are unremarkable. IMPRESSION: No active disease. Electronically Signed   By: Monte Fantasia M.D.   On: 11/25/2018 04:14    Assessment and Plan:   1  Chest pain   Pt with several episodes of CP this fall   Worrisome for unstable angina.   FHx of CAD   Discussed with pt WOuld recomm L heart cath to define coronary anatomy with poss PTCA The risks/benefits described   Pt understands and agrees to proceed. WIth mild chest pressure at rest would add IV heparin  2   HTN  FOllow   3    HL  WIll need to check lipids      For questions or updates, please contact [place] Please consult [email or web address] for contact info under     Signed, Dorris Carnes, MD  11/25/2018 2:59 PM

## 2018-11-25 NOTE — ED Notes (Signed)
ED Provider at bedside. 

## 2018-11-25 NOTE — Progress Notes (Signed)
Troponin = 108. Called as Critical Lab. Dr. Eileen Stanford notified.

## 2018-11-25 NOTE — Consult Note (Addendum)
Cardiology Consultation:   Patient ID: Curtis Weiss; [id number]; [birth year]   Admit date: 11/24/2018 Date of Consult: 11/25/2018  Primary Care Provider: Drake Leach, MD Primary Cardiologist: Dr. Lenise Arena Primary Electrophysiologist:  None    Patient Profile:   Curtis Weiss is a 73 y.o. 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 Earnest Conroy.  History of Present Illness:   Mr. 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   He was in his usual state of health until about 11 PM last night when he experienced chest heaviness with bilateral arm pain and associated diaphoresis and shortness of breath.(Like elephant sitting on chest)  This episode happened after he had just gotten in bed to sleep and resolved in 15 minutes.  During the day, he had been playing golf and in fact did experience chest heaviness but had resolved briefly.  Prior to these episodes, he had a similar episode of bilateral arm pain in September while he was at the beach and experienced nausea, vomiting shortness of breath which lasted for about 10 minutes.  He felt well afterwards and had follow-up with cardiology where an echocardiogram was ordered which did not reveal any abnormalities.  Last week Saturday, he had another episode of chest heaviness around 11 PM with bilateral arm pain, nausea and diaphoresis which also lasted for 15 minutes.  Currently, he denies chest pain, chest heaviness, shortness of breath, nausea, vomiting, diaphoresis, headache, dizziness, lightheadedness.   ED course: Initially hypertensive to 190s/80s, pulse initially 85 (recent HR in the 40s), EKG without acute ST or T wave abnormalities, high-sensitivity troponin 17>>44>>108  Past Medical History:  Diagnosis Date  . AAA (abdominal aortic aneurysm) without rupture ([place])   . Chronic insomnia   . Herniated lumbar intervertebral disc   . Hypertension   . Idiopathic peripheral neuropathy   . Pure hypercholesterolemia     Past Surgical History:  Procedure Laterality Date  . Arm surgery    . SHOULDER SURGERY       Home Medications:  Prior to Admission medications   Medication Sig Start Date End Date Taking? Authorizing Provider  amLODipine (NORVASC) 5 MG tablet Take 5 mg by mouth 2 (two) times daily. 11/30/15  Yes [provider]  aspirin EC 81 MG tablet Take 81 mg by mouth daily.   Yes [provider]  Cyanocobalamin (VITAMIN B-12 PO) Take 1 tablet by mouth daily.   Yes [provider]  doxazosin (CARDURA) 4 MG tablet Take 4 mg by mouth 2 (two) times daily. 01/24/16  Yes [provider]  metoprolol (LOPRESSOR) 100 MG tablet Take 100 mg by mouth 2 (two) times daily.   Yes [provider]  Multiple Vitamins-Minerals (MULTIVITAMIN PO) Take 1 tablet by mouth daily.   Yes [provider]  quinapril (ACCUPRIL) 40 MG tablet Take 1 tablet (40 mg total) by mouth daily. Patient taking differently: Take 40 mg by mouth at bedtime.  10/19/18  Yes Lelon Perla, MD    Inpatient Medications: Scheduled Meds: . amLODipine  5 mg Oral Daily  . [START ON 11/26/2018] aspirin EC  325 mg Oral Daily  .  doxazosin  4 mg Oral BID  . enoxaparin (LOVENOX) injection  40 mg Subcutaneous Q24H  . [START ON 11/26/2018] lisinopril  40 mg Oral Daily  . metoprolol tartrate  100 mg Oral BID  . potassium chloride  20 mEq Oral Once   Continuous Infusions:  PRN Meds: nitroGLYCERIN  Allergies:   No Known Allergies  Social History:   Social History    Socioeconomic History  . Marital status: Married    Spouse name: Not on file  . Number of children: 3  . Years of education: Not on file  . Highest education level: Not on file  Occupational History    Comment: Retired  Scientific laboratory technician  . Financial resource strain: Not on file  . Food insecurity    Worry: Not on file    Inability: Not on file  . Transportation needs    Medical: Not on file    Non-medical: Not on file  Tobacco Use  . Smoking status: Former Research scientist (life sciences)  . Smokeless tobacco: Never Used  Substance and Sexual Activity  . Alcohol use: Yes    Comment: 14  . Drug use: Not on file  . Sexual activity: Not on file  Lifestyle  . Physical activity    Days per week: Not on file    Minutes per session: Not on file  . Stress: Not at all  Relationships  . Social Herbalist on phone: Not on file    Gets together: Not on file    Attends religious service: Not on file    Active member of club or organization: Not on file    Attends meetings of clubs or organizations: Not on file    Relationship status: Not on file  . Intimate partner violence    Fear of current or ex partner: Not on file    Emotionally abused: Not on file    Physically abused: Not on file    Forced sexual activity: Not on file  Other Topics Concern  . Not on file  Social History Narrative  . Not on file    Family History:   Brother with MI in his 69s and underwent CABG  Family History  Problem Relation Age of Onset  . Hypertension Mother   . Hypertension Father      ROS:  Please see the history of present illness.  ROS  All other ROS reviewed and negative.     Social history: Currently lives in [place].  Quit tobacco use about 40 years ago.  Denies illicit drug use however states that he will have a couple shots of vodka every night.   Physical Exam/Data:   Vitals:   11/25/18 0712 11/25/18 0921 11/25/18 1000 11/25/18 1147  BP: 132/78 (!) 142/75 (!) 141/65 140/66  Pulse: (!) 56  (!) 48 (!) 37   Resp: 18 16 16 18   Temp: 98.4 F (36.9 C)   (!) 97.5 F (36.4 C)  TempSrc: Oral   Oral  SpO2: 98% 97% 97% 98%  Weight:    96.3 kg  Height:    6' (1.829 m)   No intake or output data in the 24 hours ending 11/25/18 1355 Filed Weights   11/25/18 1147  Weight: 96.3 kg   Body mass index is 28.79 kg/m.  General:  Well nourished, well developed, in no acute distress HEENT: normal Neck: no JVD Endocrine:  No thryomegaly Vascular: 2+ radial and dorsalis pedis pulses Cardiac:  normal S1, S2; RRR;  no murmur  Lungs:  clear to auscultation bilaterally, no wheezing, rhonchi or rales  Abd: soft, nontender, no hepatomegaly  Ext: no edema Musculoskeletal:  No deformities, BUE and BLE strength normal and equal Skin: warm and dry  Neuro:  CNs 2-12 intact, no focal abnormalities noted Psych:  Normal affect   EKG:  The EKG was personally reviewed and demonstrates: Sinus bradycardia, T wave inversion in anterior lateral leads Telemetry:  Telemetry was personally reviewed and demonstrates:  Sinus bradycardia   Relevant CV Studies: 10/26/2018-echocardiogram  1. Left ventricular ejection fraction, by visual estimation, is 60 to 65%. The left ventricle has normal function. Normal left ventricular size. There is mildly increased left ventricular hypertrophy.  2. Left ventricular diastolic Doppler parameters are consistent with impaired relaxation pattern of LV diastolic filling.  3. Global right ventricle has normal systolic function.The right ventricular size is normal. No increase in right ventricular wall thickness.  4. Left atrial size was moderately dilated.  5. Right atrial size was normal.  6. The mitral valve is normal in structure. Mild mitral valve regurgitation. No evidence of mitral stenosis.  7. The tricuspid valve is normal in structure. Tricuspid valve regurgitation is mild.  8. The aortic valve is normal in structure. Aortic valve regurgitation was not visualized by  color flow Doppler. Mild to moderate aortic valve sclerosis/calcification without any evidence of aortic stenosis.  9. The pulmonic valve was normal in structure. Pulmonic valve regurgitation is not visualized by color flow Doppler. 10. Mildly elevated pulmonary artery systolic pressure. 11. The inferior vena cava is normal in size with greater than 50% respiratory variability, suggesting right atrial pressure of 3 mmHg. 12. The average left ventricular global longitudinal strain is -18.3 %.  Laboratory Data:  Chemistry Recent Labs  Lab 11/25/18 0001  NA 142  K 3.4*  CL 108  CO2 21*  GLUCOSE 120*  BUN 19  CREATININE 1.12  CALCIUM 9.4  GFRNONAA >60  GFRAA >60  ANIONGAP 13    Recent Labs  Lab 11/25/18 0001  PROT 8.3*  ALBUMIN 4.9  AST 26  ALT 28  ALKPHOS 69  BILITOT 0.8   Hematology Recent Labs  Lab 11/25/18 0001 11/25/18 1311  WBC 8.2 5.9  RBC 4.83 4.30  HGB 16.9 15.1  HCT 47.8 42.6  MCV 99.0 99.1  MCH 35.0* 35.1*  MCHC 35.4 35.4  RDW 12.3 12.6  PLT 149* 142*   Cardiac EnzymesNo results for input(s): TROPONINI in the last 168 hours. No results for input(s): TROPIPOC in the last 168 hours.  BNPNo results for input(s): BNP, PROBNP in the last 168 hours.  DDimer No results for input(s): DDIMER in the last 168 hours.  Radiology/Studies:  Dg Chest Port 1 View  Result Date: 11/25/2018 CLINICAL DATA:  Left chest pain EXAM: PORTABLE CHEST 1 VIEW COMPARISON:  None. FINDINGS: The heart size and mediastinal contours are within normal limits. Both lungs are clear. The visualized skeletal structures are unremarkable. IMPRESSION: No active disease. Electronically Signed   By: Monte Fantasia M.D.   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  Initial EKG in the ED revealed sinus rhythm with minimal T wave inversion in aVR and V1.  Troponin has been steadily increasing 17>>44>>108.  Repeat EKG showed  TWI in anterio-lateral leads.  Currently he denies chest pain, shortness of breath, diaphoresis, nausea, dizziness, lightheadedness. We have explained the risks and benefits of cardiac craterization and he is agreeable to proceed.  -Will plan for diagnostic and therapeutic LHC -Heparin infusion  -Aspirin 81mg  daily  -EKG in the am  - -Continue cardiac monitoring -SL Nitroglycerin as needed for chest pain  -NPO midnight   #Sinus Bradycardia Long standing history and is asymptomatic. Prior to hospitalization, he was on metoprolol 100mg  BID. We will like to continue metoprolol 12.5 mg q 6 hours to avoid rebound HTN and tachycardia  -Continue metoprolol 12.5mg  q 6 hours (Hold if HR <50)   #Essential hypertension BP currently 144/66 -Continue amlodipine, Lisinopril, doxazosin, Metoprolol (low dose)   -IV hydralazine PRN for SBP >150  #AAA He has known AAA measuring 3.2 cm.   #Hypercholesterolemia He states he has tried cholesterol medication in the past and started having symptoms of hair loss and persistently elevated cholesterol so he discontinued medication -Follow-up lipid panel -Lipitor 80mg  daily  May end up on Repatha if does not tolerate   #Hypokalemia S/p repletion with po Kdur   For questions or updates, please contact [place] Please consult [email or web address] for contact info under Cardiology/STEMI.   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  Occas with N/V    Does note increased burping in this time but denies reflux. Came in last night after a spell   Now with minimal discomfort  On exam    PT in NAD   [place] chest pressure HR 40s to 50s  SBP 150s Neck:  JVP nromal  No bruits Lungs are CTA Cardiac RRR  No S3   No signif murmurs Abd is supple Ext are without edema   EKGs show SR with some dynamic T wave changes with T wave inversion on last EKG  WIth above I would reocmm L heart cath to define anatomy Heparin for now Cut back on b blocker   Being held at current dose due to heart rate Lipitor 40 mg for now  Risks / benefits for L heart cath reviewed with pt   He understands and agrees to proceed.  Dorris Carnes MD 11/25/2018

## 2018-11-26 ENCOUNTER — Encounter (HOSPITAL_COMMUNITY): Payer: Self-pay | Admitting: Internal Medicine

## 2018-11-26 ENCOUNTER — Other Ambulatory Visit: Payer: Self-pay | Admitting: *Deleted

## 2018-11-26 ENCOUNTER — Encounter (HOSPITAL_COMMUNITY)
Admission: EM | Disposition: A | Discharge status: Home / Self Care | Payer: Self-pay | Source: Home / Self Care | Attending: Cardiothoracic Surgery

## 2018-11-26 DIAGNOSIS — I495 Sick sinus syndrome: Secondary | ICD-10-CM | POA: Diagnosis not present

## 2018-11-26 DIAGNOSIS — D696 Thrombocytopenia, unspecified: Secondary | ICD-10-CM | POA: Diagnosis not present

## 2018-11-26 DIAGNOSIS — I200 Unstable angina: Secondary | ICD-10-CM | POA: Diagnosis not present

## 2018-11-26 DIAGNOSIS — E785 Hyperlipidemia, unspecified: Secondary | ICD-10-CM | POA: Diagnosis present

## 2018-11-26 DIAGNOSIS — I714 Abdominal aortic aneurysm, without rupture, unspecified: Secondary | ICD-10-CM | POA: Diagnosis present

## 2018-11-26 DIAGNOSIS — I25119 Atherosclerotic heart disease of native coronary artery with unspecified angina pectoris: Secondary | ICD-10-CM | POA: Diagnosis present

## 2018-11-26 DIAGNOSIS — I340 Nonrheumatic mitral (valve) insufficiency: Secondary | ICD-10-CM | POA: Diagnosis not present

## 2018-11-26 DIAGNOSIS — M5126 Other intervertebral disc displacement, lumbar region: Secondary | ICD-10-CM | POA: Diagnosis not present

## 2018-11-26 DIAGNOSIS — Z8249 Family history of ischemic heart disease and other diseases of the circulatory system: Secondary | ICD-10-CM | POA: Diagnosis not present

## 2018-11-26 DIAGNOSIS — J9811 Atelectasis: Secondary | ICD-10-CM | POA: Diagnosis not present

## 2018-11-26 DIAGNOSIS — Z01810 Encounter for preprocedural cardiovascular examination: Secondary | ICD-10-CM | POA: Diagnosis not present

## 2018-11-26 DIAGNOSIS — I2510 Atherosclerotic heart disease of native coronary artery without angina pectoris: Secondary | ICD-10-CM | POA: Diagnosis not present

## 2018-11-26 DIAGNOSIS — Z7982 Long term (current) use of aspirin: Secondary | ICD-10-CM | POA: Diagnosis not present

## 2018-11-26 DIAGNOSIS — I10 Essential (primary) hypertension: Secondary | ICD-10-CM | POA: Diagnosis not present

## 2018-11-26 DIAGNOSIS — Z951 Presence of aortocoronary bypass graft: Secondary | ICD-10-CM | POA: Diagnosis not present

## 2018-11-26 DIAGNOSIS — I4892 Unspecified atrial flutter: Secondary | ICD-10-CM | POA: Diagnosis not present

## 2018-11-26 DIAGNOSIS — R69 Illness, unspecified: Secondary | ICD-10-CM | POA: Diagnosis not present

## 2018-11-26 DIAGNOSIS — Z79899 Other long term (current) drug therapy: Secondary | ICD-10-CM | POA: Diagnosis not present

## 2018-11-26 DIAGNOSIS — I214 Non-ST elevation (NSTEMI) myocardial infarction: Secondary | ICD-10-CM | POA: Diagnosis not present

## 2018-11-26 DIAGNOSIS — I25110 Atherosclerotic heart disease of native coronary artery with unstable angina pectoris: Secondary | ICD-10-CM

## 2018-11-26 DIAGNOSIS — D62 Acute posthemorrhagic anemia: Secondary | ICD-10-CM | POA: Diagnosis not present

## 2018-11-26 DIAGNOSIS — G609 Hereditary and idiopathic neuropathy, unspecified: Secondary | ICD-10-CM | POA: Diagnosis not present

## 2018-11-26 DIAGNOSIS — R001 Bradycardia, unspecified: Secondary | ICD-10-CM | POA: Diagnosis not present

## 2018-11-26 DIAGNOSIS — Z87891 Personal history of nicotine dependence: Secondary | ICD-10-CM | POA: Diagnosis not present

## 2018-11-26 DIAGNOSIS — E876 Hypokalemia: Secondary | ICD-10-CM | POA: Diagnosis present

## 2018-11-26 DIAGNOSIS — I440 Atrioventricular block, first degree: Secondary | ICD-10-CM | POA: Diagnosis present

## 2018-11-26 DIAGNOSIS — F5104 Psychophysiologic insomnia: Secondary | ICD-10-CM | POA: Diagnosis present

## 2018-11-26 DIAGNOSIS — Z4682 Encounter for fitting and adjustment of non-vascular catheter: Secondary | ICD-10-CM | POA: Diagnosis not present

## 2018-11-26 DIAGNOSIS — E7800 Pure hypercholesterolemia, unspecified: Secondary | ICD-10-CM | POA: Diagnosis not present

## 2018-11-26 DIAGNOSIS — Z20828 Contact with and (suspected) exposure to other viral communicable diseases: Secondary | ICD-10-CM | POA: Diagnosis not present

## 2018-11-26 DIAGNOSIS — M549 Dorsalgia, unspecified: Secondary | ICD-10-CM | POA: Diagnosis not present

## 2018-11-26 HISTORY — PX: LEFT HEART CATH AND CORONARY ANGIOGRAPHY: CATH118249

## 2018-11-26 LAB — COMPREHENSIVE METABOLIC PANEL
ALT: 23 U/L (ref 0–44)
AST: 21 U/L (ref 15–41)
Albumin: 3.8 g/dL (ref 3.5–5.0)
Alkaline Phosphatase: 59 U/L (ref 38–126)
Anion gap: 12 (ref 5–15)
BUN: 16 mg/dL (ref 8–23)
CO2: 22 mmol/L (ref 22–32)
Calcium: 9.2 mg/dL (ref 8.9–10.3)
Chloride: 105 mmol/L (ref 98–111)
Creatinine, Ser: 1.11 mg/dL (ref 0.61–1.24)
GFR calc Af Amer: >60 mL/min (ref >60)
GFR calc non Af Amer: >60 mL/min (ref >60)
Glucose, Bld: 98 mg/dL (ref 70–99)
Potassium: 3.7 mmol/L (ref 3.5–5.1)
Sodium: 139 mmol/L (ref 135–145)
Total Bilirubin: 0.9 mg/dL (ref 0.3–1.2)
Total Protein: 6.5 g/dL (ref 6.5–8.1)

## 2018-11-26 LAB — LIPID PANEL
Cholesterol: 213 mg/dL — ABNORMAL HIGH (ref 0–200)
HDL: 31 mg/dL — ABNORMAL LOW (ref >40)
LDL Cholesterol: 131 mg/dL — ABNORMAL HIGH (ref 0–99)
Total CHOL/HDL Ratio: 6.9 RATIO
Triglycerides: 254 mg/dL — ABNORMAL HIGH (ref <150)
VLDL: 51 mg/dL — ABNORMAL HIGH (ref 0–40)

## 2018-11-26 LAB — CBC
HCT: 41 % (ref 39.0–52.0)
Hemoglobin: 14.8 g/dL (ref 13.0–17.0)
MCH: 35.6 pg — ABNORMAL HIGH (ref 26.0–34.0)
MCHC: 36.1 g/dL — ABNORMAL HIGH (ref 30.0–36.0)
MCV: 98.6 fL (ref 80.0–100.0)
Platelets: 137 10*3/uL — ABNORMAL LOW (ref 150–400)
RBC: 4.16 MIL/uL — ABNORMAL LOW (ref 4.22–5.81)
RDW: 12.4 % (ref 11.5–15.5)
WBC: 6.4 10*3/uL (ref 4.0–10.5)
nRBC: 0 % (ref 0.0–0.2)

## 2018-11-26 LAB — HEPARIN LEVEL (UNFRACTIONATED)
Heparin Unfractionated: 0.43 IU/mL (ref 0.30–0.70)
Heparin Unfractionated: 0.34 IU/mL (ref 0.30–0.70)

## 2018-11-26 SURGERY — LEFT HEART CATH AND CORONARY ANGIOGRAPHY
Anesthesia: LOCAL

## 2018-11-26 MED ORDER — ASPIRIN 81 MG PO CHEW
81.0000 mg | CHEWABLE_TABLET | ORAL | Status: AC
  Administered 2018-11-26: 81 mg via ORAL
  Filled 2018-11-26: qty 1

## 2018-11-26 MED ORDER — ONDANSETRON HCL 4 MG/2ML IJ SOLN: 4.0000 mg | Freq: Four times a day (QID) | INTRAMUSCULAR | Status: DC | PRN

## 2018-11-26 MED ORDER — SODIUM CHLORIDE 0.9% FLUSH: 3.0000 mL | INTRAVENOUS | Status: DC | PRN

## 2018-11-26 MED ORDER — LABETALOL HCL 5 MG/ML IV SOLN: 10.0000 mg | INTRAVENOUS | Status: AC | PRN

## 2018-11-26 MED ORDER — HEPARIN SODIUM (PORCINE) 1000 UNIT/ML IJ SOLN
INTRAMUSCULAR | Status: DC | PRN
  Administered 2018-11-26: 5000 [IU] via INTRAVENOUS

## 2018-11-26 MED ORDER — HYDRALAZINE HCL 20 MG/ML IJ SOLN: 10.0000 mg | INTRAMUSCULAR | Status: AC | PRN

## 2018-11-26 MED ORDER — VERAPAMIL HCL 2.5 MG/ML IV SOLN
INTRAVENOUS | Status: AC
  Filled 2018-11-26: qty 2

## 2018-11-26 MED ORDER — FENTANYL CITRATE (PF) 100 MCG/2ML IJ SOLN
INTRAMUSCULAR | Status: DC | PRN
  Administered 2018-11-26: 50 ug via INTRAVENOUS

## 2018-11-26 MED ORDER — ACETAMINOPHEN 325 MG PO TABS
650.0000 mg | ORAL_TABLET | ORAL | Status: DC | PRN
  Administered 2018-11-26: 650 mg via ORAL
  Filled 2018-11-26: qty 2

## 2018-11-26 MED ORDER — SODIUM CHLORIDE 0.9 % IV SOLN: 250.0000 mL | INTRAVENOUS | Status: DC | PRN

## 2018-11-26 MED ORDER — IOHEXOL 350 MG/ML SOLN
INTRAVENOUS | Status: DC | PRN
  Administered 2018-11-26: 70 mL via INTRA_ARTERIAL

## 2018-11-26 MED ORDER — SODIUM CHLORIDE 0.9 % IV SOLN: INTRAVENOUS | Status: AC

## 2018-11-26 MED ORDER — SODIUM CHLORIDE 0.9 % WEIGHT BASED INFUSION
1.0000 mL/kg/h | INTRAVENOUS | Status: DC
  Administered 2018-11-26: 1 mL/kg/h via INTRAVENOUS

## 2018-11-26 MED ORDER — FENTANYL CITRATE (PF) 100 MCG/2ML IJ SOLN
INTRAMUSCULAR | Status: AC
  Filled 2018-11-26: qty 2

## 2018-11-26 MED ORDER — SODIUM CHLORIDE 0.9 % WEIGHT BASED INFUSION
3.0000 mL/kg/h | INTRAVENOUS | Status: DC
  Administered 2018-11-26: 3 mL/kg/h via INTRAVENOUS

## 2018-11-26 MED ORDER — HEPARIN (PORCINE) IN NACL 1000-0.9 UT/500ML-% IV SOLN
INTRAVENOUS | Status: AC
  Filled 2018-11-26: qty 1000

## 2018-11-26 MED ORDER — VERAPAMIL HCL 2.5 MG/ML IV SOLN
INTRAVENOUS | Status: DC | PRN
  Administered 2018-11-26: 10 mL via INTRA_ARTERIAL

## 2018-11-26 MED ORDER — LIDOCAINE HCL (PF) 1 % IJ SOLN
INTRAMUSCULAR | Status: AC
  Filled 2018-11-26: qty 30

## 2018-11-26 MED ORDER — HEPARIN SODIUM (PORCINE) 1000 UNIT/ML IJ SOLN
INTRAMUSCULAR | Status: AC
  Filled 2018-11-26: qty 1

## 2018-11-26 MED ORDER — MIDAZOLAM HCL 2 MG/2ML IJ SOLN
INTRAMUSCULAR | Status: DC | PRN
  Administered 2018-11-26 (×2): 1 mg via INTRAVENOUS

## 2018-11-26 MED ORDER — SODIUM CHLORIDE 0.9% FLUSH
3.0000 mL | Freq: Two times a day (BID) | INTRAVENOUS | Status: DC
  Administered 2018-11-27 – 2018-11-29 (×3): 3 mL via INTRAVENOUS

## 2018-11-26 MED ORDER — SODIUM CHLORIDE 0.9% FLUSH: 3.0000 mL | Freq: Two times a day (BID) | INTRAVENOUS | Status: DC

## 2018-11-26 MED ORDER — MIDAZOLAM HCL 2 MG/2ML IJ SOLN
INTRAMUSCULAR | Status: AC
  Filled 2018-11-26: qty 2

## 2018-11-26 MED ORDER — LIDOCAINE HCL (PF) 1 % IJ SOLN
INTRAMUSCULAR | Status: DC | PRN
  Administered 2018-11-26: 2 mL via INTRADERMAL

## 2018-11-26 MED ORDER — HEPARIN (PORCINE) 25000 UT/250ML-% IV SOLN
1400.0000 [IU]/h | INTRAVENOUS | Status: DC
  Administered 2018-11-26: 1150 [IU]/h via INTRAVENOUS
  Administered 2018-11-27 – 2018-11-29 (×3): 1350 [IU]/h via INTRAVENOUS
  Filled 2018-11-26 (×4): qty 250

## 2018-11-26 MED ORDER — HEPARIN (PORCINE) IN NACL 1000-0.9 UT/500ML-% IV SOLN
INTRAVENOUS | Status: DC | PRN
  Administered 2018-11-26 (×2): 500 mL

## 2018-11-26 SURGICAL SUPPLY — 11 items
CATH OPTITORQUE TIG 4.0 5F (CATHETERS) ×1 IMPLANT
DEVICE RAD COMP TR BAND LRG (VASCULAR PRODUCTS) ×1 IMPLANT
GLIDESHEATH SLEND SS 6F .021 (SHEATH) ×1 IMPLANT
GUIDEWIRE INQWIRE 1.5J.035X260 (WIRE) IMPLANT
INQWIRE 1.5J .035X260CM (WIRE) ×2
KIT HEART LEFT (KITS) ×2 IMPLANT
PACK CARDIAC CATHETERIZATION (CUSTOM PROCEDURE TRAY) ×2 IMPLANT
SHEATH PROBE COVER 6X72 (BAG) ×1 IMPLANT
SYR MEDRAD MARK 7 150ML (SYRINGE) ×2 IMPLANT
TRANSDUCER W/STOPCOCK (MISCELLANEOUS) ×2 IMPLANT
TUBING CIL FLEX 10 FLL-RA (TUBING) ×2 IMPLANT

## 2018-11-26 NOTE — Progress Notes (Signed)
[facility] PROGRESS NOTE  DOY KOPKA [id number] DOB: [birth year] DOA: 11/24/2018 PCP: Drake Leach, MD  Assessment/Plan: 1.  Episodic chest pain: Concern for ACS versus symptomatic bradycardia.  Recent echocardiogram from 9/22 showed mild LVH otherwise within normal limits with no wall motion abnormalities and intact EF. Trops 17>44>108>91. Lipid profile with cholesterol 213, HDL 31, LDL131, triglycerides 254. Evaluated by cardiology who recommended heparin gtt and cardiac cath. Cath 10/23 revealed  2.  Hypertension: Reduced metoprolol dosage due concern for bradycardia. Other home meds include Norvasc, lisinopril. -continue home med -of note, cardiology note indicates may need to adjust BB even more depending on HR after cath -monitor  3.  Sinus bradycardia: In the setting of high doses of beta-blockers. TSH within limits of normal.  -monitor  4.  Upper arm cramping: Secondary to neuropathy versus hypokalemia.  CK/mag/phos all within limits of normal. Potassium repleted.  5.  Mild hypokalemia: Replaced and resolved -monitor  6.  Hyperlipidemia: Not on any meds at home.  Lipid panel as noted above -continue lipitor  7.  Abdominal aortic aneurysm: Around 3 cm and unchanged since prior exam.  Follow-up as outpatient.   Code Status: full Family Communication: patient Disposition Plan: home after cardiology clears   Consultants:  Dr Harrington Challenger cardiology  Procedures:  Cardiac cath 10/23>>  Antibiotics:    HPI/Subjective: Awake alert. Denies pain/discomfort  Objective: Vitals:   11/26/18 0443 11/26/18 0535  BP:  (!) 156/72  Pulse: (!) 36 (!) 42  Resp:  18  Temp:  97.8 F (36.6 C)  SpO2:  98%    Intake/Output Summary (Last 24 hours) at 11/26/2018 1318 Last data filed at 11/26/2018 0653 Gross per 24 hour  Intake 649.22 ml  Output -  Net 649.22 ml   Filed Weights   11/25/18 1147 11/26/18 0535  Weight: 96.3 kg 96 kg    Exam:   General:   Awake alert no acute distress  Cardiovascular: bradycardia but regular no mgr no LE edema  Respiratory: normal effort BS clear bilaterally no wheeze  Abdomen: non-distended non tender +BS no guarding  Musculoskeletal: joints without swelling/erythema   Data Reviewed: Basic Metabolic Panel: Recent Labs  Lab 11/25/18 0001 11/25/18 1311 11/25/18 1522 11/26/18 0025  NA 142  --   --  139  K 3.4*  --   --  3.7  CL 108  --   --  105  CO2 21*  --   --  22  GLUCOSE 120*  --   --  98  BUN 19  --   --  16  CREATININE 1.12 1.05  --  1.11  CALCIUM 9.4  --   --  9.2  MG  --   --  2.4  --   PHOS  --   --  3.2  --    Liver Function Tests: Recent Labs  Lab 11/25/18 0001 11/26/18 0025  AST 26 21  ALT 28 23  ALKPHOS 69 59  BILITOT 0.8 0.9  PROT 8.3* 6.5  ALBUMIN 4.9 3.8   No results for input(s): LIPASE, AMYLASE in the last 168 hours. No results for input(s): AMMONIA in the last 168 hours. CBC: Recent Labs  Lab 11/25/18 0001 11/25/18 1311 11/26/18 0025  WBC 8.2 5.9 6.4  NEUTROABS 3.9  --   --   HGB 16.9 15.1 14.8  HCT 47.8 42.6 41.0  MCV 99.0 99.1 98.6  PLT 149* 142* 137*   Cardiac Enzymes: Recent Labs  Lab 11/25/18 1522  CKTOTAL 126   BNP (last 3 results) No results for input(s): BNP in the last 8760 hours.  ProBNP (last 3 results) No results for input(s): PROBNP in the last 8760 hours.  CBG: No results for input(s): GLUCAP in the last 168 hours.  Recent Results (from the past 240 hour(s))  SARS CORONAVIRUS 2 (TAT 6-24 HRS) Nasopharyngeal Nasopharyngeal Swab     Status: None   Collection Time: 11/25/18  1:49 AM   Specimen: Nasopharyngeal Swab  Result Value Ref Range Status   SARS Coronavirus 2 NEGATIVE NEGATIVE Final    Comment: (NOTE) SARS-CoV-2 target nucleic acids are NOT DETECTED. The SARS-CoV-2 RNA is generally detectable in upper and lower respiratory specimens during the acute phase of infection. Negative results do not preclude SARS-CoV-2  infection, do not rule out co-infections with other pathogens, and should not be used as the sole basis for treatment or other patient management decisions. Negative results must be combined with clinical observations, patient history, and epidemiological information. The expected result is Negative. Fact Sheet for Patients: SugarRoll.be Fact Sheet for Healthcare Providers: https://www.woods-mathews.com/ This test is not yet approved or cleared by the [place] FDA and  has been authorized for detection and/or diagnosis of SARS-CoV-2 by FDA under an Emergency Use Authorization (EUA). This EUA will remain  in effect (meaning this test can be used) for the duration of the COVID-19 declaration under Section 56 4(b)(1) of the Act, 21 U.S.C. section 360bbb-3(b)(1), unless the authorization is terminated or revoked sooner. Performed at [facility], [place] [address] [place], [place] [address]      Studies: Dg Chest Port 1 View  Result Date: 11/25/2018 CLINICAL DATA:  Left chest pain EXAM: PORTABLE CHEST 1 VIEW COMPARISON:  None. FINDINGS: The heart size and mediastinal contours are within normal limits. Both lungs are clear. The visualized skeletal structures are unremarkable. IMPRESSION: No active disease. Electronically Signed   By: Monte Fantasia M.D.   On: 11/25/2018 04:14    Scheduled Meds: . amLODipine  5 mg Oral Daily  . aspirin EC  81 mg Oral Daily  . atorvastatin  80 mg Oral q1800  . doxazosin  4 mg Oral BID  . folic acid  1 mg Oral Daily  . lisinopril  40 mg Oral Daily  . metoprolol tartrate  12.5 mg Oral Q6H  . multivitamin with minerals  1 tablet Oral Daily  . sodium chloride flush  3 mL Intravenous Q12H  . thiamine  100 mg Oral Daily   Or  . thiamine  100 mg Intravenous Daily   Continuous Infusions: . sodium chloride    . sodium chloride 1 mL/kg/hr (11/26/18 0653)  . heparin 1,150 Units/hr (11/26/18 [id number])     Principal Problem:   Unstable angina (HCC) Active Problems:   Chest pain   Hypertension   Bradycardia   Hypokalemia   AAA (abdominal aortic aneurysm) without rupture ([place])    Time spent: 88 minutes    [place] NP  [facility]  If 7PM-7AM, please contact night-coverage at [email or web address], password [facility] 11/26/2018, 1:18 PM  LOS: 0 days

## 2018-11-26 NOTE — Progress Notes (Signed)
ANTICOAGULATION CONSULT NOTE - Follow Up Consult  Pharmacy Consult for heparin restart 8hr after sheath removal Indication: chest pain/ACS  No Known Allergies  Patient Measurements: Height: 6' (182.9 cm) Weight: 211 lb 10.3 oz (96 kg) IBW/kg (Calculated) : 77.6 Heparin Dosing Weight: 96 kg  Vital Signs: Temp: 98 F (36.7 C) (10/23 1500) Temp Source: Oral (10/23 1500) BP: 142/63 (10/23 1500) Pulse Rate: 56 (10/23 1500)  Labs: Recent Labs    11/25/18 0001 11/25/18 0149 11/25/18 1311 11/25/18 1522 11/25/18 2143 11/26/18 0025 11/26/18 0930  HGB 16.9  --  15.1  --   --  14.8  --   HCT 47.8  --  42.6  --   --  41.0  --   PLT 149*  --  142*  --   --  137*  --   APTT  --   --   --   --  43*  --   --   LABPROT  --   --   --   --  13.7  --   --   INR  --   --   --   --  1.1  --   --   HEPARINUNFRC  --   --   --   --   --  0.43 0.34  CREATININE 1.12  --  1.05  --   --  1.11  --   CKTOTAL  --   --   --  126  --   --   --   TROPONINIHS 17 44* 108* 91*  --   --   --     Estimated Creatinine Clearance: 71.3 mL/min (by C-G formula based on SCr of 1.11 mg/dL).  Assessment: 73 yr old male admitted with NSTEMI to start on IV heparin. He is not on anticoagulants prior to admission. Pt rec'd Lovenox 40 mg SQ X 1 at 15:13 this afternoon and started heparin 1150 units/hr with therapeutic HLF. Now s/p Left heart cath with sheath pull at 16:07. Will restart heparin at midnight.   Goal of Therapy:  Heparin level 0.3-0.7 units/ml Monitor platelets by anticoagulation protocol: Yes   Plan:  Restart heparin at 1150 units/hr at midnight Check HL at 6am Monitor CBC daily Monitor for signs/symptoms of bleeding

## 2018-11-26 NOTE — Progress Notes (Signed)
ANTICOAGULATION CONSULT NOTE  Pharmacy Consult for Heparin Indication: ACS/NSTEMI  No Known Allergies  Patient Measurements: Height: 6' (182.9 cm) Weight: 211 lb 10.3 oz (96 kg) IBW/kg (Calculated) : 77.6 Heparin Dosing Weight: 96.3  Vital Signs: Temp: 97.8 F (36.6 C) (10/23 0535) Temp Source: Oral (10/23 0535) BP: 156/72 (10/23 0535) Pulse Rate: 42 (10/23 0535)  Labs: Recent Labs    11/25/18 0001 11/25/18 0149 11/25/18 1311 11/25/18 1522 11/25/18 2143 11/26/18 0025 11/26/18 0930  HGB 16.9  --  15.1  --   --  14.8  --   HCT 47.8  --  42.6  --   --  41.0  --   PLT 149*  --  142*  --   --  137*  --   APTT  --   --   --   --  43*  --   --   LABPROT  --   --   --   --  13.7  --   --   INR  --   --   --   --  1.1  --   --   HEPARINUNFRC  --   --   --   --   --  0.43 0.34  CREATININE 1.12  --  1.05  --   --  1.11  --   CKTOTAL  --   --   --  126  --   --   --   TROPONINIHS 17 44* 108* 91*  --   --   --     Estimated Creatinine Clearance: 71.3 mL/min (by C-G formula based on SCr of 1.11 mg/dL).   Medical History: Past Medical History:  Diagnosis Date  . AAA (abdominal aortic aneurysm) without rupture ([place])   . Chronic insomnia   . Herniated lumbar intervertebral disc   . Hypertension   . Idiopathic peripheral neuropathy   . Pure hypercholesterolemia     Assessment: 73 yr old male admitted with NSTEMI to start on IV heparin. He is  on no anticoagulants prior to admission. Pt rec'd Lovenox 40 mg SQ X 1 at 15:13 this afternoon.   Medical history includes: HTN, sinus bradycardia, hypercholesterolemia, atherosclerosis, known AAA  Heparin level came back therapeutic at 0.34, on 1150 units/hr. CBC stable. No s/sx of bleeding. No infusion issues.   Goal of Therapy:  Heparin level 0.3-0.7 units/ml Monitor platelets by anticoagulation protocol: Yes   Plan:  Cont heparin at 1150 units/hr Monitor CBC daily Monitor for signs/symptoms of bleeding F/u after cardiac  cath  Antonietta Jewel, PharmD, [place] Pharmacist  Phone: [phone]  Please check AMION for all [place] phone numbers After 10:00 PM, call [place] [phone]

## 2018-11-26 NOTE — Progress Notes (Signed)
Progress Note  Patient Name: Curtis Weiss Date of Encounter: 11/26/2018  Primary Cardiologist: Kirk Ruths, MD   Subjective   No Pain  No SOB   Inpatient Medications    Scheduled Meds: . amLODipine  5 mg Oral Daily  . aspirin EC  81 mg Oral Daily  . atorvastatin  80 mg Oral q1800  . doxazosin  4 mg Oral BID  . folic acid  1 mg Oral Daily  . lisinopril  40 mg Oral Daily  . metoprolol tartrate  12.5 mg Oral Q6H  . multivitamin with minerals  1 tablet Oral Daily  . sodium chloride flush  3 mL Intravenous Q12H  . thiamine  100 mg Oral Daily   Or  . thiamine  100 mg Intravenous Daily   Continuous Infusions: . sodium chloride    . sodium chloride 1 mL/kg/hr (11/26/18 0653)  . heparin 1,150 Units/hr (11/26/18 0653)   PRN Meds: sodium chloride, hydrALAZINE, LORazepam **OR** LORazepam, nitroGLYCERIN, sodium chloride flush   Vital Signs    Vitals:   11/25/18 2102 11/25/18 2307 11/26/18 0443 11/26/18 0535  BP: (!) 154/77   (!) 156/72  Pulse: (!) 50 (!) 45 (!) 36 (!) 42  Resp: 18   18  Temp: 98.3 F (36.8 C)   97.8 F (36.6 C)  TempSrc: Oral   Oral  SpO2: 93%   98%  Weight:    96 kg  Height:        Intake/Output Summary (Last 24 hours) at 11/26/2018 0915 Last data filed at 11/26/2018 0653 Gross per 24 hour  Intake 649.22 ml  Output -  Net 649.22 ml   Last 3 Weights 11/26/2018 11/25/2018 10/20/2018  Weight (lbs) 211 lb 10.3 oz 212 lb 4.8 oz 217 lb  Weight (kg) 96 kg 96.299 kg 98.431 kg      Telemetry    SB- Personally Reviewed  ECG    - Personally Reviewed  Physical Exam   GEN: No acute distress.   Neck: No JVD Cardiac: RRR, no murmurs, rubs, or gallops.  Respiratory: Clear to auscultation bilaterally. GI: Soft, nontender, non-distended  MS: No edema; No deformity. Neuro:  Nonfocal  Psych: Normal affect   Labs    High Sensitivity Troponin:   Recent Labs  Lab 11/25/18 0001 11/25/18 0149 11/25/18 1311 11/25/18 1522  TROPONINIHS 17 44*  108* 91*      Chemistry Recent Labs  Lab 11/25/18 0001 11/25/18 1311 11/26/18 0025  NA 142  --  139  K 3.4*  --  3.7  CL 108  --  105  CO2 21*  --  22  GLUCOSE 120*  --  98  BUN 19  --  16  CREATININE 1.12 1.05 1.11  CALCIUM 9.4  --  9.2  PROT 8.3*  --  6.5  ALBUMIN 4.9  --  3.8  AST 26  --  21  ALT 28  --  23  ALKPHOS 69  --  59  BILITOT 0.8  --  0.9  GFRNONAA >60 >60 >60  GFRAA >60 >60 >60  ANIONGAP 13  --  12     Hematology Recent Labs  Lab 11/25/18 0001 11/25/18 1311 11/26/18 0025  WBC 8.2 5.9 6.4  RBC 4.83 4.30 4.16*  HGB 16.9 15.1 14.8  HCT 47.8 42.6 41.0  MCV 99.0 99.1 98.6  MCH 35.0* 35.1* 35.6*  MCHC 35.4 35.4 36.1*  RDW 12.3 12.6 12.4  PLT 149* 142* 137*  BNPNo results for input(s): BNP, PROBNP in the last 168 hours.   DDimer No results for input(s): DDIMER in the last 168 hours.   Radiology    Dg Chest Port 1 View  Result Date: 11/25/2018 CLINICAL DATA:  Left chest pain EXAM: PORTABLE CHEST 1 VIEW COMPARISON:  None. FINDINGS: The heart size and mediastinal contours are within normal limits. Both lungs are clear. The visualized skeletal structures are unremarkable. IMPRESSION: No active disease. Electronically Signed   By: Monte Fantasia M.D.   On: 11/25/2018 04:14    Cardiac Studies   Cath pending   Patient Profile    73 year old active gentleman with essential hypertension, sinus bradycardia since he was a teenager, hypercholesterolemia, atherosclerosis presenting with nonexertional chest pressure with radiation to the arms, diaphoresis and shortness of breath which resolved in 15 minutes.    Pt has had several episodes since September    Assessment & Plan    1  Chest pain  No pain this am on heparin  Minimal elevation torp 108   Cath today    2  Sinus bradycardia  HR has been low   PT says he has always had   I pulled back on b blocker yesterday   Will need to assess and adjust BP meds after cath  3  HTN  BP is up  Not severe     4  AAA  Hx AAA which measures 3.2 cm  4  HL   Pt tried cholesterol meds in past   Didn't tolerate due to hair loss  He says willing to try (not sure of side effect)     Started lipitor 80 mg last night  If doesn't tolerate may need to consider Repatha     For questions or updates, please contact [facility] Please consult [email or web address] for contact info under        Signed, Dorris Carnes, MD  11/26/2018, 9:15 AM

## 2018-11-26 NOTE — Care Management Obs Status (Signed)
[place] NOTIFICATION   Patient Details  Name: Curtis Weiss MRN: [id number] Date of Birth: 1945-07-20   Medicare Observation Status Notification Given:  Yes    Bethena Roys, RN 11/26/2018, 11:05 AM

## 2018-11-26 NOTE — Brief Op Note (Signed)
11/26/2018  4:20 PM  PATIENT:  Curtis Weiss  73 y.o. 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 Moderate LCx-OM 30% Well preserved LVEF with normal LVEDP.  PROCEDURE:  Procedure(s): LEFT HEART CATH AND CORONARY ANGIOGRAPHY (N/A)   Time Out: Verified patient identification, verified procedure, site/side was marked, verified correct patient position, special equipment/implants available, medications/allergies/relevent history reviewed, required imaging and test results available. Performed.  Access:  . RIGHT Radial Artery: 6 Fr sheath -- Seldinger technique using Micropuncture Kit -- Direct ultrasound guidance used.  Permanent image obtained and placed on chart. -- 10 mL radial cocktail IA; 4000 Units IV Heparin  Left Heart Catheterization: 5Fr Catheters advanced or exchanged over a J-wire under direct fluoroscopic guidance into the ascending aorta; TIG 4.0 catheter advanced across the aortic valve first.  . * LV Hemodynamics (LV Gram):  TIG 4.0 Catheter  . 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 -->  Images were reviewed with Dr. Daneen Schick, we both agreed that patient's best option was CABG.  Preparations are made for transfer the patient back to the  nursing unit for ongoing care and CVTS consultation  Upon completion of Angiogaphy, the catheter was removed completely out of the body over a wire, without complication.   Radial sheath removed in the Cardiac Catheterization lab with TR Band placed for hemostasis.  TR Band: 1600 Hours; 16 mL air  MEDICATIONS . SQ Lidocaine 3 mL . Radial Cocktail: 3 mg Verapmil in 10 mL NS . Heparin: 4000 units   SURGEON:  Surgeon(s) and Role:    * Leonie Man, MD - Primary  ANESTHESIA:   regional and IV sedation (2 IV Versed, 50 mcg IV fentanyl)  EBL: Minimal; <20 mL  BLOOD ADMINISTERED:none  COUNTS:  YES  DICTATION: .Note written in EPIC  PLAN OF CARE: Transfer to nursing unit for post cath care TR band removal.  CVTS consultation for severe calcified three-vessel disease (not good PCI target).  Will restart IV heparin 8 hours after sheath removal.  PATIENT DISPOSITION:  PACU - hemodynamically stable.   Delay start of Pharmacological VTE agent (>24hrs) due to surgical blood loss or risk of bleeding: not applicable  [name], M.D., M.S. Interventional Cardiologist   Pager # [phone] Phone # [phone] [address] [place], [place] [address]

## 2018-11-26 NOTE — Progress Notes (Signed)
ANTICOAGULATION CONSULT NOTE  Pharmacy Consult for Heparin Indication: ACS/NSTEMI  No Known Allergies  Patient Measurements: Height: 6' (182.9 cm) Weight: 212 lb 4.8 oz (96.3 kg) IBW/kg (Calculated) : 77.6 Heparin Dosing Weight: 96.3  Vital Signs: Temp: 98.3 F (36.8 C) (10/22 2102) Temp Source: Oral (10/22 2102) BP: 154/77 (10/22 2102) Pulse Rate: 45 (10/22 2307)  Labs: Recent Labs    11/25/18 0001 11/25/18 0149 11/25/18 1311 11/25/18 1522 11/25/18 2143 11/26/18 0025  HGB 16.9  --  15.1  --   --  14.8  HCT 47.8  --  42.6  --   --  41.0  PLT 149*  --  142*  --   --  137*  APTT  --   --   --   --  43*  --   LABPROT  --   --   --   --  13.7  --   INR  --   --   --   --  1.1  --   HEPARINUNFRC  --   --   --   --   --  0.43  CREATININE 1.12  --  1.05  --   --  1.11  CKTOTAL  --   --   --  126  --   --   TROPONINIHS 17 44* 108* 91*  --   --     Estimated Creatinine Clearance: 71.3 mL/min (by C-G formula based on SCr of 1.11 mg/dL).   Medical History: Past Medical History:  Diagnosis Date  . AAA (abdominal aortic aneurysm) without rupture ([place])   . Chronic insomnia   . Herniated lumbar intervertebral disc   . Hypertension   . Idiopathic peripheral neuropathy   . Pure hypercholesterolemia     Assessment: 73 yr old male admitted with NSTEMI to start on IV heparin. He is  on no anticoagulants prior to admission. Pt rec'd Lovenox 40 mg SQ X 1 at 15:13 this afternoon. Left heart cath planned for 10/23.  H/H WNL; platelet count 142  Medical history includes: HTN, sinus bradycardia, hypercholesterolemia, atherosclerosis, known AAA  10/23 AM update:  Initial heparin level therapeutic H/H good, plts low/stable  Goal of Therapy:  Heparin level 0.3-0.7 units/ml Monitor platelets by anticoagulation protocol: Yes   Plan:  Cont heparin at 1150 units/hr Confirmatory heparin level at 1000 Monitor CBC daily Monitor for signs/symptoms of bleeding  Narda Bonds,  PharmD, BCPS Clinical Pharmacist Phone: [phone]

## 2018-11-26 NOTE — H&P (View-Only) (Signed)
Progress Note  Patient Name: Curtis Weiss Date of Encounter: 11/26/2018  Primary Cardiologist: Kirk Ruths, MD   Subjective   No Pain  No SOB   Inpatient Medications    Scheduled Meds: . amLODipine  5 mg Oral Daily  . aspirin EC  81 mg Oral Daily  . atorvastatin  80 mg Oral q1800  . doxazosin  4 mg Oral BID  . folic acid  1 mg Oral Daily  . lisinopril  40 mg Oral Daily  . metoprolol tartrate  12.5 mg Oral Q6H  . multivitamin with minerals  1 tablet Oral Daily  . sodium chloride flush  3 mL Intravenous Q12H  . thiamine  100 mg Oral Daily   Or  . thiamine  100 mg Intravenous Daily   Continuous Infusions: . sodium chloride    . sodium chloride 1 mL/kg/hr (11/26/18 0653)  . heparin 1,150 Units/hr (11/26/18 0653)   PRN Meds: sodium chloride, hydrALAZINE, LORazepam **OR** LORazepam, nitroGLYCERIN, sodium chloride flush   Vital Signs    Vitals:   11/25/18 2102 11/25/18 2307 11/26/18 0443 11/26/18 0535  BP: (!) 154/77   (!) 156/72  Pulse: (!) 50 (!) 45 (!) 36 (!) 42  Resp: 18   18  Temp: 98.3 F (36.8 C)   97.8 F (36.6 C)  TempSrc: Oral   Oral  SpO2: 93%   98%  Weight:    96 kg  Height:        Intake/Output Summary (Last 24 hours) at 11/26/2018 0915 Last data filed at 11/26/2018 0653 Gross per 24 hour  Intake 649.22 ml  Output -  Net 649.22 ml   Last 3 Weights 11/26/2018 11/25/2018 10/20/2018  Weight (lbs) 211 lb 10.3 oz 212 lb 4.8 oz 217 lb  Weight (kg) 96 kg 96.299 kg 98.431 kg      Telemetry    SB- Personally Reviewed  ECG    - Personally Reviewed  Physical Exam   GEN: No acute distress.   Neck: No JVD Cardiac: RRR, no murmurs, rubs, or gallops.  Respiratory: Clear to auscultation bilaterally. GI: Soft, nontender, non-distended  MS: No edema; No deformity. Neuro:  Nonfocal  Psych: Normal affect   Labs    High Sensitivity Troponin:   Recent Labs  Lab 11/25/18 0001 11/25/18 0149 11/25/18 1311 11/25/18 1522  TROPONINIHS 17 44*  108* 91*      Chemistry Recent Labs  Lab 11/25/18 0001 11/25/18 1311 11/26/18 0025  NA 142  --  139  K 3.4*  --  3.7  CL 108  --  105  CO2 21*  --  22  GLUCOSE 120*  --  98  BUN 19  --  16  CREATININE 1.12 1.05 1.11  CALCIUM 9.4  --  9.2  PROT 8.3*  --  6.5  ALBUMIN 4.9  --  3.8  AST 26  --  21  ALT 28  --  23  ALKPHOS 69  --  59  BILITOT 0.8  --  0.9  GFRNONAA >60 >60 >60  GFRAA >60 >60 >60  ANIONGAP 13  --  12     Hematology Recent Labs  Lab 11/25/18 0001 11/25/18 1311 11/26/18 0025  WBC 8.2 5.9 6.4  RBC 4.83 4.30 4.16*  HGB 16.9 15.1 14.8  HCT 47.8 42.6 41.0  MCV 99.0 99.1 98.6  MCH 35.0* 35.1* 35.6*  MCHC 35.4 35.4 36.1*  RDW 12.3 12.6 12.4  PLT 149* 142* 137*  BNPNo results for input(s): BNP, PROBNP in the last 168 hours.   DDimer No results for input(s): DDIMER in the last 168 hours.   Radiology    Dg Chest Port 1 View  Result Date: 11/25/2018 CLINICAL DATA:  Left chest pain EXAM: PORTABLE CHEST 1 VIEW COMPARISON:  None. FINDINGS: The heart size and mediastinal contours are within normal limits. Both lungs are clear. The visualized skeletal structures are unremarkable. IMPRESSION: No active disease. Electronically Signed   By: Monte Fantasia M.D.   On: 11/25/2018 04:14    Cardiac Studies   Cath pending   Patient Profile    73 year old active gentleman with essential hypertension, sinus bradycardia since he was a teenager, hypercholesterolemia, atherosclerosis presenting with nonexertional chest pressure with radiation to the arms, diaphoresis and shortness of breath which resolved in 15 minutes.    Pt has had several episodes since September    Assessment & Plan    1  Chest pain  No pain this am on heparin  Minimal elevation torp 108   Cath today    2  Sinus bradycardia  HR has been low   PT says he has always had   I pulled back on b blocker yesterday   Will need to assess and adjust BP meds after cath  3  HTN  BP is up  Not severe     4  AAA  Hx AAA which measures 3.2 cm  4  HL   Pt tried cholesterol meds in past   Didn't tolerate due to hair loss  He says willing to try (not sure of side effect)     Started lipitor 80 mg last night  If doesn't tolerate may need to consider Repatha     For questions or updates, please contact [facility] Please consult [email or web address] for contact info under        Signed, Dorris Carnes, MD  11/26/2018, 9:15 AM

## 2018-11-26 NOTE — Interval H&P Note (Signed)
History and Physical Interval Note:  11/26/2018 3:18 PM  Curtis Weiss  has presented today for surgery, with the diagnosis of UNSTABLE ANGINA.  The various methods of treatment have been discussed with the patient and family. After consideration of risks, benefits and other options for treatment, the patient has consented to  Procedure(s): LEFT HEART CATH AND CORONARY ANGIOGRAPHY (N/A) as a surgical intervention.  The patient's history has been reviewed, patient examined, no change in status, stable for surgery.  I have reviewed the patient's chart and labs.  Questions were answered to the patient's satisfaction.   . Cath Lab Visit (complete for each Cath Lab visit)  Clinical Evaluation Leading to the Procedure:   ACS: Yes.    Non-ACS:    Anginal Classification: CCS III  Anti-ischemic medical therapy: Maximal Therapy (2 or more classes of medications)  Non-Invasive Test Results: No non-invasive testing performed  Prior CABG: No previous CABG  Curtis Weiss

## 2018-11-27 ENCOUNTER — Other Ambulatory Visit (HOSPITAL_COMMUNITY): Payer: Medicare HMO

## 2018-11-27 DIAGNOSIS — R001 Bradycardia, unspecified: Secondary | ICD-10-CM

## 2018-11-27 DIAGNOSIS — I2510 Atherosclerotic heart disease of native coronary artery without angina pectoris: Secondary | ICD-10-CM | POA: Diagnosis not present

## 2018-11-27 DIAGNOSIS — I714 Abdominal aortic aneurysm, without rupture: Secondary | ICD-10-CM | POA: Diagnosis not present

## 2018-11-27 DIAGNOSIS — I10 Essential (primary) hypertension: Secondary | ICD-10-CM | POA: Diagnosis not present

## 2018-11-27 LAB — CBC
HCT: 40.6 % (ref 39.0–52.0)
Hemoglobin: 14.4 g/dL (ref 13.0–17.0)
MCH: 35.3 pg — ABNORMAL HIGH (ref 26.0–34.0)
MCHC: 35.5 g/dL (ref 30.0–36.0)
MCV: 99.5 fL (ref 80.0–100.0)
Platelets: 123 10*3/uL — ABNORMAL LOW (ref 150–400)
RBC: 4.08 MIL/uL — ABNORMAL LOW (ref 4.22–5.81)
RDW: 12.2 % (ref 11.5–15.5)
WBC: 5.6 10*3/uL (ref 4.0–10.5)
nRBC: 0 % (ref 0.0–0.2)

## 2018-11-27 LAB — BASIC METABOLIC PANEL
Anion gap: 9 (ref 5–15)
BUN: 11 mg/dL (ref 8–23)
CO2: 22 mmol/L (ref 22–32)
Calcium: 9.1 mg/dL (ref 8.9–10.3)
Chloride: 109 mmol/L (ref 98–111)
Creatinine, Ser: 0.98 mg/dL (ref 0.61–1.24)
GFR calc Af Amer: >60 mL/min (ref >60)
GFR calc non Af Amer: >60 mL/min (ref >60)
Glucose, Bld: 105 mg/dL — ABNORMAL HIGH (ref 70–99)
Potassium: 3.7 mmol/L (ref 3.5–5.1)
Sodium: 140 mmol/L (ref 135–145)

## 2018-11-27 LAB — HEPARIN LEVEL (UNFRACTIONATED)
Heparin Unfractionated: 0.22 IU/mL — ABNORMAL LOW (ref 0.30–0.70)
Heparin Unfractionated: 0.37 IU/mL (ref 0.30–0.70)

## 2018-11-27 MED ORDER — SENNA 8.6 MG PO TABS
1.0000 | ORAL_TABLET | Freq: Every day | ORAL | Status: DC | PRN
  Administered 2018-11-27: 8.6 mg via ORAL
  Filled 2018-11-27: qty 1

## 2018-11-27 MED ORDER — MAGNESIUM HYDROXIDE 400 MG/5ML PO SUSP: 30.0000 mL | Freq: Every day | ORAL | Status: DC | PRN

## 2018-11-27 MED ORDER — DOCUSATE SODIUM 100 MG PO CAPS
100.0000 mg | ORAL_CAPSULE | Freq: Two times a day (BID) | ORAL | Status: DC
  Administered 2018-11-27 – 2018-11-29 (×6): 100 mg via ORAL
  Filled 2018-11-27 (×6): qty 1

## 2018-11-27 MED ORDER — METOPROLOL TARTRATE 12.5 MG HALF TABLET: 12.5000 mg | ORAL_TABLET | Freq: Two times a day (BID) | ORAL | Status: DC

## 2018-11-27 NOTE — Progress Notes (Signed)
ANTICOAGULATION CONSULT NOTE - Follow Up Consult  Pharmacy Consult for heparin restarted 8hr after sheath removal Indication: chest pain/ACS  No Known Allergies  Patient Measurements: Height: 6' (182.9 cm) Weight: 214 lb 4.6 oz (97.2 kg) IBW/kg (Calculated) : 77.6 Heparin Dosing Weight: 96 kg  Vital Signs: Temp: 98.9 F (37.2 C) (10/24 0500) Temp Source: Oral (10/24 0500) BP: 131/56 (10/24 0500) Pulse Rate: 46 (10/24 0500)  Labs: Recent Labs    11/25/18 0001 11/25/18 0149 11/25/18 1311 11/25/18 1522 11/25/18 2143 11/26/18 0025 11/26/18 0930 11/27/18 0646  HGB 16.9  --  15.1  --   --  14.8  --  14.4  HCT 47.8  --  42.6  --   --  41.0  --  40.6  PLT 149*  --  142*  --   --  137*  --  123*  APTT  --   --   --   --  43*  --   --   --   LABPROT  --   --   --   --  13.7  --   --   --   INR  --   --   --   --  1.1  --   --   --   HEPARINUNFRC  --   --   --   --   --  0.43 0.34 0.22*  CREATININE 1.12  --  1.05  --   --  1.11  --   --   CKTOTAL  --   --   --  126  --   --   --   --   TROPONINIHS 17 44* 108* 91*  --   --   --   --     Estimated Creatinine Clearance: 71.6 mL/min (by C-G formula based on SCr of 1.11 mg/dL).  Assessment: 73 yr old male admitted with NSTEMI to start on IV heparin. He is not on anticoagulants prior to admission. Pt rec'd Lovenox 40 mg SQ X 1 at 15:13 on 10/23 and started heparin 1150 units/hr with therapeutic HLF. Now s/p Left heart cath with sheath pull at 16:07. Heparin infusion restarted around midnight on 10/24 at 1150 units/hour without bolus.   6 hour heparin level is slightly subtherapeutc at 0.22. H&H is slightly trended down but is wnl at 14.4 and 40.6, plts have decreased and are at 123 (were 149 on admission)    Goal of Therapy:  Heparin level 0.3-0.7 units/ml Monitor platelets by anticoagulation protocol: Yes   Plan:  No bolus d/t s/p sheath removal increase heparin infusion by ~2 units/kg/hour to 1350 units/hr  Check HL in 8  hours  Monitor CBC daily Monitor for signs/symptoms of bleeding    Thank you,   Eddie Candle, PharmD PGY-1 Pharmacy Resident   Please check amion for clinical pharmacist contact number

## 2018-11-27 NOTE — Plan of Care (Signed)

## 2018-11-27 NOTE — Progress Notes (Signed)
ANTICOAGULATION CONSULT NOTE - Follow Up Consult  Pharmacy Consult for heparin Indication: chest pain/ACS  No Known Allergies  Patient Measurements: Height: 6' (182.9 cm) Weight: 214 lb 4.6 oz (97.2 kg) IBW/kg (Calculated) : 77.6 Heparin Dosing Weight: 96 kg  Vital Signs:    Labs: Recent Labs    11/25/18 0149 11/25/18 1311 11/25/18 1522 11/25/18 2143  11/26/18 0025 11/26/18 0930 11/27/18 0646 11/27/18 1639  HGB  --  15.1  --   --   --  14.8  --  14.4  --   HCT  --  42.6  --   --   --  41.0  --  40.6  --   PLT  --  142*  --   --   --  137*  --  123*  --   APTT  --   --   --  43*  --   --   --   --   --   LABPROT  --   --   --  13.7  --   --   --   --   --   INR  --   --   --  1.1  --   --   --   --   --   HEPARINUNFRC  --   --   --   --    < > 0.43 0.34 0.22* 0.37  CREATININE  --  1.05  --   --   --  1.11  --  0.98  --   CKTOTAL  --   --  126  --   --   --   --   --   --   TROPONINIHS 44* 108* 91*  --   --   --   --   --   --    < > = values in this interval not displayed.    Estimated Creatinine Clearance: 81.1 mL/min (by C-G formula based on SCr of 0.98 mg/dL).  Assessment: 73 yr old male admitted with NSTEMI to start on IV heparin. He is not on anticoagulants prior to admission. He is now s/p cath with plans for possible CABG -heparin level at goal  Goal of Therapy:  Heparin level 0.3-0.7 units/ml Monitor platelets by anticoagulation protocol: Yes   Plan:  -No heparin changes -Daily heparin level and CBC   Thank you,   Hildred Laser, PharmD Clinical Pharmacist **Pharmacist phone directory can now be found on [email or web address] ([email or web address]).  Listed under [place].

## 2018-11-27 NOTE — Progress Notes (Signed)
Progress Note  Patient Name: Curtis Weiss Date of Encounter: 11/27/2018  Primary Cardiologist: Kirk Ruths, MD   Subjective   Denies any chest pain or SOB  Inpatient Medications    Scheduled Meds: . amLODipine  5 mg Oral Daily  . aspirin EC  81 mg Oral Daily  . atorvastatin  80 mg Oral q1800  . doxazosin  4 mg Oral BID  . folic acid  1 mg Oral Daily  . lisinopril  40 mg Oral Daily  . metoprolol tartrate  12.5 mg Oral Q6H  . multivitamin with minerals  1 tablet Oral Daily  . sodium chloride flush  3 mL Intravenous Q12H  . thiamine  100 mg Oral Daily   Or  . thiamine  100 mg Intravenous Daily   Continuous Infusions: . sodium chloride    . heparin 1,150 Units/hr (11/27/18 0300)   PRN Meds: sodium chloride, acetaminophen, hydrALAZINE, LORazepam **OR** LORazepam, nitroGLYCERIN, ondansetron (ZOFRAN) IV, sodium chloride flush   Vital Signs    Vitals:   11/26/18 1602 11/26/18 1607 11/26/18 2112 11/27/18 0500  BP: 133/69  (!) 145/65 (!) 131/56  Pulse: (!) 44 (!) 0 (!) 53 (!) 46  Resp: (!) 21 (!) 0 20 18  Temp:   98.3 F (36.8 C) 98.9 F (37.2 C)  TempSrc:   Oral Oral  SpO2: 99% (!) 0% 96% 96%  Weight:    97.2 kg  Height:        Intake/Output Summary (Last 24 hours) at 11/27/2018 0820 Last data filed at 11/27/2018 [id number] Gross per 24 hour  Intake 38.33 ml  Output 850 ml  Net -811.67 ml   Last 3 Weights 11/27/2018 11/26/2018 11/25/2018  Weight (lbs) 214 lb 4.6 oz 211 lb 10.3 oz 212 lb 4.8 oz  Weight (kg) 97.2 kg 96 kg 96.299 kg      Telemetry    Sinus bradycardia- Personally Reviewed  ECG    No new EKG to review- Personally Reviewed  Physical Exam   GEN: Well nourished, well developed in no acute distress HEENT: Normal NECK: No JVD; No carotid bruits LYMPHATICS: No lymphadenopathy CARDIAC:RRR, no murmurs, rubs, gallops RESPIRATORY:  Clear to auscultation without rales, wheezing or rhonchi  ABDOMEN: Soft, non-tender, non-distended  MUSCULOSKELETAL:  No edema; No deformity  SKIN: Warm and dry.  Right radial cath site clean dry with no hematoma and 2+ radial pulse NEUROLOGIC:  Alert and oriented x 3 PSYCHIATRIC:  Normal affect    Labs    High Sensitivity Troponin:   Recent Labs  Lab 11/25/18 0001 11/25/18 0149 11/25/18 1311 11/25/18 1522  TROPONINIHS 17 44* 108* 91*      Chemistry Recent Labs  Lab 11/25/18 0001 11/25/18 1311 11/26/18 0025 11/27/18 0646  NA 142  --  139 140  K 3.4*  --  3.7 3.7  CL 108  --  105 109  CO2 21*  --  22 22  GLUCOSE 120*  --  98 105*  BUN 19  --  16 11  CREATININE 1.12 1.05 1.11 0.98  CALCIUM 9.4  --  9.2 9.1  PROT 8.3*  --  6.5  --   ALBUMIN 4.9  --  3.8  --   AST 26  --  21  --   ALT 28  --  23  --   ALKPHOS 69  --  59  --   BILITOT 0.8  --  0.9  --   GFRNONAA >60 >60 >60 >60  GFRAA >60 >60 >60 >60  ANIONGAP 13  --  12 9     Hematology Recent Labs  Lab 11/25/18 1311 11/26/18 0025 11/27/18 0646  WBC 5.9 6.4 5.6  RBC 4.30 4.16* 4.08*  HGB 15.1 14.8 14.4  HCT 42.6 41.0 40.6  MCV 99.1 98.6 99.5  MCH 35.1* 35.6* 35.3*  MCHC 35.4 36.1* 35.5  RDW 12.6 12.4 12.2  PLT 142* 137* 123*    BNPNo results for input(s): BNP, PROBNP in the last 168 hours.   DDimer No results for input(s): DDIMER in the last 168 hours.   Radiology    No results found.  Cardiac Studies   Cardiac Cath 11/26/2018 Conclusion    Ost LAD to Prox LAD lesion is 60% stenosed. Prox LAD to Mid LAD lesion is 50% stenosed.  Mid LAD lesion is 90% stenosed with 95% stenosed side branch in 1st Diag. Hazy globular calcified lesion at bifurcation  Mid LAD to Dist LAD lesion is 35% stenosed. Mid Cx lesion is 35% stenosed. Not significant  Proximal RCA: Prox RCA-1 lesion is 70% stenosed -tapering to Prox RCA-2 lesion is 90% stenosed, follwed by Prox RCA-3 lesion is 50% stenosed.  Mid RCA lesion is 99% stenosed with 90% stenosed side branch in Acute Mrg. Hazy globular calcified lesion at  branch point  The left ventricular systolic function is normal.  LV end diastolic pressure is normal.  The left ventricular ejection fraction is 55-65% by visual estimate.  There is no aortic valve stenosis.   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  Moderate LCx-OM 30%  Well preserved LVEF with normal LVEDP.  RECOMMENDATIONS  Transfer to nursing unit for post cath care TR band removal.   CVTS consultation for severe calcified three-vessel disease (not good PCI target).   Will restart IV heparin 8 hours after sheath removal.  Continue aggressive risk factor modification\     Patient Profile    73 year old active gentleman with essential hypertension, sinus bradycardia since he was a teenager, hypercholesterolemia, atherosclerosis presenting with nonexertional chest pressure with radiation to the arms, diaphoresis and shortness of breath which resolved in 15 minutes.    Pt has had several episodes since September    Assessment & Plan    1.  NSTEMI -hsTrop peak at 108   -cath with severe 2 vessel CAD with poor targets for PCI -await CVTS consult -denies current CP -continue ASA, IV Heparin gtt and high dose statin. -stop BB due to bradycardia  2.  ASCAD -see cath report above -severe 2 vessel CAD -await CVTS consult for possible CABG -continue ASA, statin   3.  Sinus bradycardia   -HR remains in the 40's -stop BB for now  4.  HTN   -BP controlled this am at 131/26mmHg -continue amlodipine 5mg  daily, doxazosin 4mg  BID and Lisinopril 40mg   daily  5.  AAA   -Hx AAA which measures 3.2 cm  6.  HLD    -intolerant to statins in past due to hair loss but willing to try again -now on Lipitor 80mg  daily -will need FLP and ALT in 6 weeks -  if he does not tolerate statin then consider Repatha     For questions or updates, please contact [place] Please consult [email or web address] for contact info under        Signed, Fransico Him, MD  11/27/2018, 8:20 AM

## 2018-11-27 NOTE — Progress Notes (Signed)
Progress Note    Curtis Weiss  [id number] DOB: 03-09-1945  DOA: 11/24/2018 PCP: Drake Leach, MD    Brief Narrative:    Medical records reviewed and are as summarized below:  Curtis Weiss is an 73 y.o. male with history h/o abdominal aortic aneurysm, DDD, hypertension, hypercholesterolemia, shoulder surgery, peripheral neuropathy presented to [facility] last night with complaints of chest pain-left-sided heavy sensation, like someone sitting on his chest, 8/10, associated with dull achy pain in both upper arms and sweating.  Patient reports similar episode past weekend lasting 15 minutes which was also associated with nausea.  S/P cath with multiple vessel disease and now is tentatively schedule for CABG on Tuesday.  Assessment/Plan:   Principal Problem:   Unstable angina (HCC) Active Problems:   Chest pain   Hypertension   AAA (abdominal aortic aneurysm) without rupture (HCC)   Bradycardia   Hypokalemia   CAD (coronary artery disease)    NSTEMI/CAD:  -s/p cath: severe 2 vessel disease with poor targets for PCI -CVTS for CABG-- tentatively scheduled for Tuesday -ASA, heparin gtt, statin -no BB due to bradycardia  Hypertension:  -Norvasc, lisinopril. -d/c BB due to bradycardia  Sinus bradycardia: - TSH within limits of normal.  -monitor on tele  Mild hypokalemia:  Replaced -monitor  Hyperlipidemia:  Not on any meds at home due to side effects -will do trial of lipitor  Abdominal aortic aneurysm: -3 cm and unchanged since prior exam.  -Follow-up as outpatient.   Family Communication/Anticipated D/C date and plan/Code Status   DVT prophylaxis: heparin gtt Code Status: Full Code.  Family Communication:  Disposition Plan: needs CABG (tent date of 10/27)   Medical Consultants:    Cards  cvts  Subjective:   No chest pain-- has been told his surgery is tentatively scheduled for Tuesday  Objective:    Vitals:   11/26/18 1602  11/26/18 1607 11/26/18 2112 11/27/18 0500  BP: 133/69  (!) 145/65 (!) 131/56  Pulse: (!) 44 (!) 0 (!) 53 (!) 46  Resp: (!) 21 (!) 0 20 18  Temp:   98.3 F (36.8 C) 98.9 F (37.2 C)  TempSrc:   Oral Oral  SpO2: 99% (!) 0% 96% 96%  Weight:    97.2 kg  Height:        Intake/Output Summary (Last 24 hours) at 11/27/2018 1014 Last data filed at 11/27/2018 0427 Gross per 24 hour  Intake 38.33 ml  Output 850 ml  Net -811.67 ml   Filed Weights   11/25/18 1147 11/26/18 0535 11/27/18 0500  Weight: 96.3 kg 96 kg 97.2 kg    Exam: In bed, NAD rrr A+Ox3 No increased work of breathing, no wheezing, clear Moves all 4 ext  Data Reviewed:   I have personally reviewed following labs and imaging studies:  Labs: Labs show the following:   Basic Metabolic Panel: Recent Labs  Lab 11/25/18 0001 11/25/18 1311 11/25/18 1522 11/26/18 0025 11/27/18 0646  NA 142  --   --  139 140  K 3.4*  --   --  3.7 3.7  CL 108  --   --  105 109  CO2 21*  --   --  22 22  GLUCOSE 120*  --   --  98 105*  BUN 19  --   --  16 11  CREATININE 1.12 1.05  --  1.11 0.98  CALCIUM 9.4  --   --  9.2 9.1  MG  --   --  2.4  --   --   PHOS  --   --  3.2  --   --    GFR Estimated Creatinine Clearance: 81.1 mL/min (by C-G formula based on SCr of 0.98 mg/dL). Liver Function Tests: Recent Labs  Lab 11/25/18 0001 11/26/18 0025  AST 26 21  ALT 28 23  ALKPHOS 69 59  BILITOT 0.8 0.9  PROT 8.3* 6.5  ALBUMIN 4.9 3.8   No results for input(s): LIPASE, AMYLASE in the last 168 hours. No results for input(s): AMMONIA in the last 168 hours. Coagulation profile Recent Labs  Lab 11/25/18 2143  INR 1.1    CBC: Recent Labs  Lab 11/25/18 0001 11/25/18 1311 11/26/18 0025 11/27/18 0646  WBC 8.2 5.9 6.4 5.6  NEUTROABS 3.9  --   --   --   HGB 16.9 15.1 14.8 14.4  HCT 47.8 42.6 41.0 40.6  MCV 99.0 99.1 98.6 99.5  PLT 149* 142* 137* 123*   Cardiac Enzymes: Recent Labs  Lab 11/25/18 1522  CKTOTAL 126    BNP (last 3 results) No results for input(s): PROBNP in the last 8760 hours. CBG: No results for input(s): GLUCAP in the last 168 hours. D-Dimer: No results for input(s): DDIMER in the last 72 hours. Hgb A1c: Recent Labs    11/25/18 1311  HGBA1C 5.2   Lipid Profile: Recent Labs    11/26/18 0025  CHOL 213*  HDL 31*  LDLCALC 131*  TRIG 254*  CHOLHDL 6.9   Thyroid function studies: Recent Labs    11/25/18 1311  TSH 1.468   Anemia work up: No results for input(s): VITAMINB12, FOLATE, FERRITIN, TIBC, IRON, RETICCTPCT in the last 72 hours. Sepsis Labs: Recent Labs  Lab 11/25/18 0001 11/25/18 1311 11/26/18 0025 11/27/18 0646  WBC 8.2 5.9 6.4 5.6    Microbiology Recent Results (from the past 240 hour(s))  SARS CORONAVIRUS 2 (TAT 6-24 HRS) Nasopharyngeal Nasopharyngeal Swab     Status: None   Collection Time: 11/25/18  1:49 AM   Specimen: Nasopharyngeal Swab  Result Value Ref Range Status   SARS Coronavirus 2 NEGATIVE NEGATIVE Final    Comment: (NOTE) SARS-CoV-2 target nucleic acids are NOT DETECTED. The SARS-CoV-2 RNA is generally detectable in upper and lower respiratory specimens during the acute phase of infection. Negative results do not preclude SARS-CoV-2 infection, do not rule out co-infections with other pathogens, and should not be used as the sole basis for treatment or other patient management decisions. Negative results must be combined with clinical observations, patient history, and epidemiological information. The expected result is Negative. Fact Sheet for Patients: SugarRoll.be Fact Sheet for Healthcare Providers: https://www.woods-mathews.com/ This test is not yet approved or cleared by the [place] FDA and  has been authorized for detection and/or diagnosis of SARS-CoV-2 by FDA under an Emergency Use Authorization (EUA). This EUA will remain  in effect (meaning this test can be used) for the  duration of the COVID-19 declaration under Section 56 4(b)(1) of the Act, 21 U.S.C. section 360bbb-3(b)(1), unless the authorization is terminated or revoked sooner. Performed at [facility], [place] [address] [place], [place] [address]     Procedures and diagnostic studies:  No results found.  Medications:   . amLODipine  5 mg Oral Daily  . aspirin EC  81 mg Oral Daily  . atorvastatin  80 mg Oral q1800  . doxazosin  4 mg Oral BID  . folic acid  1 mg Oral Daily  . lisinopril  40 mg Oral Daily  . multivitamin with minerals  1 tablet Oral Daily  . sodium chloride flush  3 mL Intravenous Q12H  . thiamine  100 mg Oral Daily   Or  . thiamine  100 mg Intravenous Daily   Continuous Infusions: . sodium chloride    . heparin 1,350 Units/hr (11/27/18 0916)     LOS: 1 day   Geradine Girt  [facility]   How to contact the [facility] Attending or Consulting provider [place] or covering provider during after hours [place], for this patient?  1. Check the care team in [facility] and look for a) attending/consulting TR[facility] provider listed and b) the [facility] team listed 2. Log into [email or web address] and use [place]'s universal password to access. If you do not have the password, please contact the hospital operator. 3. Locate the [facility] provider you are looking for under [facility] and page to a number that you can be directly reached. 4. If you still have difficulty reaching the provider, please page the [facility] (Director on Call) for the Hospitalists listed on amion for assistance.  11/27/2018, 10:14 AM

## 2018-11-28 ENCOUNTER — Inpatient Hospital Stay (HOSPITAL_COMMUNITY): Payer: Medicare HMO

## 2018-11-28 DIAGNOSIS — I25110 Atherosclerotic heart disease of native coronary artery with unstable angina pectoris: Secondary | ICD-10-CM

## 2018-11-28 DIAGNOSIS — R001 Bradycardia, unspecified: Secondary | ICD-10-CM | POA: Diagnosis not present

## 2018-11-28 DIAGNOSIS — I10 Essential (primary) hypertension: Secondary | ICD-10-CM | POA: Diagnosis not present

## 2018-11-28 DIAGNOSIS — I714 Abdominal aortic aneurysm, without rupture: Secondary | ICD-10-CM | POA: Diagnosis not present

## 2018-11-28 DIAGNOSIS — Z01810 Encounter for preprocedural cardiovascular examination: Secondary | ICD-10-CM | POA: Diagnosis not present

## 2018-11-28 DIAGNOSIS — I2510 Atherosclerotic heart disease of native coronary artery without angina pectoris: Secondary | ICD-10-CM | POA: Diagnosis not present

## 2018-11-28 LAB — BASIC METABOLIC PANEL
Anion gap: 14 (ref 5–15)
BUN: 9 mg/dL (ref 8–23)
CO2: 24 mmol/L (ref 22–32)
Calcium: 9.4 mg/dL (ref 8.9–10.3)
Chloride: 106 mmol/L (ref 98–111)
Creatinine, Ser: 1.04 mg/dL (ref 0.61–1.24)
GFR calc Af Amer: >60 mL/min (ref >60)
GFR calc non Af Amer: >60 mL/min (ref >60)
Glucose, Bld: 98 mg/dL (ref 70–99)
Potassium: 3.7 mmol/L (ref 3.5–5.1)
Sodium: 144 mmol/L (ref 135–145)

## 2018-11-28 LAB — HEPARIN LEVEL (UNFRACTIONATED): Heparin Unfractionated: 0.4 IU/mL (ref 0.30–0.70)

## 2018-11-28 LAB — URINALYSIS, ROUTINE W REFLEX MICROSCOPIC
Bilirubin Urine: NEGATIVE
Glucose, UA: NEGATIVE mg/dL
Hgb urine dipstick: NEGATIVE
Ketones, ur: NEGATIVE mg/dL
Leukocytes,Ua: NEGATIVE
Nitrite: NEGATIVE
Protein, ur: NEGATIVE mg/dL
Specific Gravity, Urine: 1.011 (ref 1.005–1.030)
pH: 6 (ref 5.0–8.0)

## 2018-11-28 LAB — CBC
HCT: 41.3 % (ref 39.0–52.0)
Hemoglobin: 14.9 g/dL (ref 13.0–17.0)
MCH: 36 pg — ABNORMAL HIGH (ref 26.0–34.0)
MCHC: 36.1 g/dL — ABNORMAL HIGH (ref 30.0–36.0)
MCV: 99.8 fL (ref 80.0–100.0)
Platelets: 125 10*3/uL — ABNORMAL LOW (ref 150–400)
RBC: 4.14 MIL/uL — ABNORMAL LOW (ref 4.22–5.81)
RDW: 12.2 % (ref 11.5–15.5)
WBC: 6.3 10*3/uL (ref 4.0–10.5)
nRBC: 0 % (ref 0.0–0.2)

## 2018-11-28 LAB — SURGICAL PCR SCREEN
MRSA, PCR: NEGATIVE
Staphylococcus aureus: NEGATIVE

## 2018-11-28 NOTE — Progress Notes (Signed)
Pt denies any pain at this time. Will continue to monitor

## 2018-11-28 NOTE — Progress Notes (Signed)
Hospitalist progress note  If 7PM-7AM,  night-coverage-look on AMION -prefer pages-not epic chat,please  Curtis Weiss  [id number] DOB: 1945/11/04 DOA: 11/24/2018 PCP: Drake Leach, MD  Narrative:  22 M HTN HLD bradycardia AAA 3.2 cm peripheral neuropathy EtOH heavy use admitted 10/22 playing golf central chest pain-prior symptoms in September diaphoresis and nausea-troponin XVII-->108 EKG unchanged-underwent left heart cath placed on heparin beta-blocker adjusted secondary to bradycardia Cardiac catheterization 10/23 = extensive 70 to 90% stenosis calcified 99% stenosis RV marginal branch as well as extensive calcification D1 Apparently planning for CABG Assessment & Plan: NSTEMI Deferring to CVTS it looks like planning for CABG 10/27 AM-defer to cardiology for further discussions-nitroglycerin for chest pain continuing heparin as per them Bradycardia Has been bradycardic in the 40 range since high school-defer plan to cardiology HTN Continuing amlodipine 5 lisinopril 40 AAA Outpatient monitoring needs scan in 1 year for 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 HLD Continue Lipitor 80 daily Idiopathic neuropathy Moderate alcohol use Etiology unclear drinks only 2 drinks a day-discontinue CIWA protocol as no signs and symptoms of withdrawal  DVT prophylaxis: Heparin  Code Status:   Full   Family Communication:   None present Disposition Plan: Unclear at this time  Consultants:   Cardiology  CVTS Procedures:   Cath 10/23 as above Antimicrobials:   None Subjective: Awake alert coherent no distress no chest pain and about in the room without difficulty Objective: Vitals:   11/26/18 2112 11/27/18 0500 11/27/18 1952 11/28/18 0423  BP: (!) 145/65 (!) 131/56 (!) 150/75 (!) 142/72  Pulse: (!) 53 (!) 46 (!) 58 (!) 45  Resp: 20 18 15 16   Temp: 98.3 F (36.8 C) 98.9 F (37.2 C) 98.1 F (36.7 C) 97.7 F (36.5 C)  TempSrc: Oral Oral Oral Oral  SpO2: 96% 96% 94% 97%  Weight:  97.2 kg  95.4 kg   Height:        Intake/Output Summary (Last 24 hours) at 11/28/2018 0738 Last data filed at 11/28/2018 0500 Gross per 24 hour  Intake 337.47 ml  Output -  Net 337.47 ml   Filed Weights   11/26/18 0535 11/27/18 0500 11/28/18 0423  Weight: 96 kg 97.2 kg 95.4 kg    Examination: EOMI NCAT no focal deficit smile symmetric thick neck Mallampati 2 S1-S2 no murmur slightly bradycardic Chest clear no added sound Abdomen obese nontender no rebound no guarding Moving all 4 limbs equally  Data Reviewed: I have personally reviewed following labs and imaging studies CBC: Recent Labs  Lab 11/25/18 0001 11/25/18 1311 11/26/18 0025 11/27/18 0646 11/28/18 0421  WBC 8.2 5.9 6.4 5.6 6.3  NEUTROABS 3.9  --   --   --   --   HGB 16.9 15.1 14.8 14.4 14.9  HCT 47.8 42.6 41.0 40.6 41.3  MCV 99.0 99.1 98.6 99.5 99.8  PLT 149* 142* 137* 123* 0000000*   Basic Metabolic Panel: Recent Labs  Lab 11/25/18 0001 11/25/18 1311 11/25/18 1522 11/26/18 0025 11/27/18 0646 11/28/18 0421  NA 142  --   --  139 140 144  K 3.4*  --   --  3.7 3.7 3.7  CL 108  --   --  105 109 106  CO2 21*  --   --  22 22 24   GLUCOSE 120*  --   --  98 105* 98  BUN 19  --   --  16 11 9   CREATININE 1.12 1.05  --  1.11 0.98 1.04  CALCIUM 9.4  --   --  9.2 9.1 9.4  MG  --   --  2.4  --   --   --   PHOS  --   --  3.2  --   --   --    GFR: Estimated Creatinine Clearance: 75.8 mL/min (by C-G formula based on SCr of 1.04 mg/dL). Liver Function Tests: Recent Labs  Lab 11/25/18 0001 11/26/18 0025  AST 26 21  ALT 28 23  ALKPHOS 69 59  BILITOT 0.8 0.9  PROT 8.3* 6.5  ALBUMIN 4.9 3.8   No results for input(s): LIPASE, AMYLASE in the last 168 hours. No results for input(s): AMMONIA in the last 168 hours. Coagulation Profile: Recent Labs  Lab 11/25/18 2143  INR 1.1   Cardiac Enzymes: Radiology Studies: Reviewed images personally in health database  Scheduled Meds: . amLODipine  5 mg Oral Daily  . aspirin EC  81  mg Oral Daily  . atorvastatin  80 mg Oral q1800  . docusate sodium  100 mg Oral BID  . doxazosin  4 mg Oral BID  . folic acid  1 mg Oral Daily  . lisinopril  40 mg Oral Daily  . multivitamin with minerals  1 tablet Oral Daily  . sodium chloride flush  3 mL Intravenous Q12H  . thiamine  100 mg Oral Daily   Or  . thiamine  100 mg Intravenous Daily   Continuous Infusions: . sodium chloride    . heparin 1,350 Units/hr (11/28/18 0500)    LOS: 2 days   Time spent: [place], MD [facility]  11/28/2018, 7:38 AM

## 2018-11-28 NOTE — Consult Note (Signed)
TCTS Consult Pt seen and examined; films and chart reviewed. Full report to follow. Very pleasant man with severe, anatomically complicated LAD disease in addition to severe RCA disease. Plan CABG on Tuesday. He and wife are in agreement and wish to proceed. Will complete the routine work-up tomorrow. Marlana Mckowen Z. Orvan Seen, MD TCTS

## 2018-11-28 NOTE — Progress Notes (Signed)
Pre-CABG Dopplers completed. Refer to "CV Proc" under chart review to view preliminary results.  11/28/2018 1:57 PM Maudry Mayhew, MHA, RVT, RDCS, RDMS

## 2018-11-28 NOTE — Progress Notes (Signed)
Progress Note  Patient Name: Curtis Weiss Date of Encounter: 11/28/2018  Primary Cardiologist: Kirk Ruths, MD   Subjective   No CP or SOB this am  Inpatient Medications    Scheduled Meds: . amLODipine  5 mg Oral Daily  . aspirin EC  81 mg Oral Daily  . atorvastatin  80 mg Oral q1800  . docusate sodium  100 mg Oral BID  . doxazosin  4 mg Oral BID  . folic acid  1 mg Oral Daily  . lisinopril  40 mg Oral Daily  . multivitamin with minerals  1 tablet Oral Daily  . sodium chloride flush  3 mL Intravenous Q12H  . thiamine  100 mg Oral Daily   Or  . thiamine  100 mg Intravenous Daily   Continuous Infusions: . sodium chloride    . heparin 1,350 Units/hr (11/28/18 0500)   PRN Meds: sodium chloride, acetaminophen, hydrALAZINE, LORazepam **OR** LORazepam, nitroGLYCERIN, ondansetron (ZOFRAN) IV, senna, sodium chloride flush   Vital Signs    Vitals:   11/26/18 2112 11/27/18 0500 11/27/18 1952 11/28/18 0423  BP: (!) 145/65 (!) 131/56 (!) 150/75 (!) 142/72  Pulse: (!) 53 (!) 46 (!) 58 (!) 45  Resp: 20 18 15 16   Temp: 98.3 F (36.8 C) 98.9 F (37.2 C) 98.1 F (36.7 C) 97.7 F (36.5 C)  TempSrc: Oral Oral Oral Oral  SpO2: 96% 96% 94% 97%  Weight:  97.2 kg  95.4 kg  Height:        Intake/Output Summary (Last 24 hours) at 11/28/2018 [id number] Last data filed at 11/28/2018 0500 Gross per 24 hour  Intake 337.47 ml  Output -  Net 337.47 ml   Last 3 Weights 11/28/2018 11/27/2018 11/26/2018  Weight (lbs) 210 lb 6.4 oz 214 lb 4.6 oz 211 lb 10.3 oz  Weight (kg) 95.437 kg 97.2 kg 96 kg      Telemetry    Sinus bradycardia- Personally Reviewed  ECG    No new EKG to review- Personally Reviewed  Physical Exam   GEN: Well nourished, well developed in no acute distress HEENT: Normal NECK: No JVD; No carotid bruits LYMPHATICS: No lymphadenopathy CARDIAC:RRR, no murmurs, rubs, gallops RESPIRATORY:  Clear to auscultation without rales, wheezing or rhonchi  ABDOMEN:  Soft, non-tender, non-distended MUSCULOSKELETAL:  No edema; No deformity  SKIN: Warm and dry NEUROLOGIC:  Alert and oriented x 3 PSYCHIATRIC:  Normal affect    Labs    High Sensitivity Troponin:   Recent Labs  Lab 11/25/18 0001 11/25/18 0149 11/25/18 1311 11/25/18 1522  TROPONINIHS 17 44* 108* 91*      Chemistry Recent Labs  Lab 11/25/18 0001  11/26/18 0025 11/27/18 0646 11/28/18 0421  NA 142  --  139 140 144  K 3.4*  --  3.7 3.7 3.7  CL 108  --  105 109 106  CO2 21*  --  22 22 24   GLUCOSE 120*  --  98 105* 98  BUN 19  --  16 11 9   CREATININE 1.12   < > 1.11 0.98 1.04  CALCIUM 9.4  --  9.2 9.1 9.4  PROT 8.3*  --  6.5  --   --   ALBUMIN 4.9  --  3.8  --   --   AST 26  --  21  --   --   ALT 28  --  23  --   --   ALKPHOS 69  --  59  --   --  BILITOT 0.8  --  0.9  --   --   GFRNONAA >60   < > >60 >60 >60  GFRAA >60   < > >60 >60 >60  ANIONGAP 13  --  12 9 14    < > = values in this interval not displayed.     Hematology Recent Labs  Lab 11/26/18 0025 11/27/18 0646 11/28/18 0421  WBC 6.4 5.6 6.3  RBC 4.16* 4.08* 4.14*  HGB 14.8 14.4 14.9  HCT 41.0 40.6 41.3  MCV 98.6 99.5 99.8  MCH 35.6* 35.3* 36.0*  MCHC 36.1* 35.5 36.1*  RDW 12.4 12.2 12.2  PLT 137* 123* 125*    BNPNo results for input(s): BNP, PROBNP in the last 168 hours.   DDimer No results for input(s): DDIMER in the last 168 hours.   Radiology    No results found.  Cardiac Studies   Cardiac Cath 11/26/2018 Conclusion    Ost LAD to Prox LAD lesion is 60% stenosed. Prox LAD to Mid LAD lesion is 50% stenosed.  Mid LAD lesion is 90% stenosed with 95% stenosed side branch in 1st Diag. Hazy globular calcified lesion at bifurcation  Mid LAD to Dist LAD lesion is 35% stenosed. Mid Cx lesion is 35% stenosed. Not significant  Proximal RCA: Prox RCA-1 lesion is 70% stenosed -tapering to Prox RCA-2 lesion is 90% stenosed, follwed by Prox RCA-3 lesion is 50% stenosed.  Mid RCA lesion is 99%  stenosed with 90% stenosed side branch in Acute Mrg. Hazy globular calcified lesion at branch point  The left ventricular systolic function is normal.  LV end diastolic pressure is normal.  The left ventricular ejection fraction is 55-65% by visual estimate.  There is no aortic valve stenosis.   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  Moderate LCx-OM 30%  Well preserved LVEF with normal LVEDP.  RECOMMENDATIONS  Transfer to nursing unit for post cath care TR band removal.   CVTS consultation for severe calcified three-vessel disease (not good PCI target).   Will restart IV heparin 8 hours after sheath removal.  Continue aggressive risk factor modification\     Patient Profile    73 year old active gentleman with essential hypertension, sinus bradycardia since he was a teenager, hypercholesterolemia, atherosclerosis presenting with nonexertional chest pressure with radiation to the arms, diaphoresis and shortness of breath which resolved in 15 minutes.    Pt has had several episodes since September    Assessment & Plan    1.  NSTEMI -hsTrop peak at 108   -cath with severe 2 vessel CAD with poor targets for PCI -await CVTS consult -continues to deny CP -continue ASA, IV Heparin gtt and high dose statin. -BB stopped due to bradycardia  2.  ASCAD -see cath report above -severe 2 vessel CAD not amenable to PCI -await CVTS consult for possible CABG -continue ASA, statin   3.  Sinus bradycardia   -HR remains in the 40's despite stopping BB -remains  asymptomatic  4.  HTN   -BP remains controlled at 142/34mmHg -continue amlodipine 5mg  daily, doxazosin 4mg  BID and Lisinopril 40mg  daily  5.  AAA   -Hx AAA which measures 3.2 cm  6.  HLD    -intolerant to statins in past due to hair loss but willing to try again -now on Lipitor 80mg  daily -will need FLP and ALT in 6 weeks -if he does not tolerate statin then consider Repatha     For questions or updates, please contact [place] Please consult [email or web address] for contact info under        Signed, Fransico Him, MD  11/28/2018, 7:39 AM

## 2018-11-28 NOTE — Progress Notes (Signed)
Pt was provided with an incentive spirometer, taught how to use device, and the importance of it. Pt stated understanding of all teaching and demonstrated usage. All questions were addressed.

## 2018-11-28 NOTE — Consult Note (Signed)
[address]       [place] [address]             [phone]        Clearence K Wolin [place] Medical Record [id number] Date of Birth: 1945/02/20  Referring: No ref. provider found Primary Care: Drake Leach, MD Primary Cardiologist:Brian Stanford Breed, MD  Chief Complaint:    Chief Complaint  Patient presents with   Chest Pain    History of Present Illness:      Curtis Weiss is a 73 year old gentleman without significant past medical history who has sought evaluation for crescendo angina which has been worsening since the previous month.  This ultimately led to an noted where he felt extreme pain and presented to the emergency department where he had a mild troponin elevation.  The patient underwent left heart catheterization demonstrating complicated anatomy of LAD and diagonal disease as well as right coronary artery disease.  Due to the nature of the anatomy, patient is obtaining cardiothoracic surgery for consideration of CABG.  He has been chest pain-free since admitted.  His cardiac history is otherwise notable for essential hypertension, hyperlipidemia, and bradycardia.  Current Activity/ Functional Status: Patient is independent with mobility/ambulation, transfers, ADL's, IADL's.   Zubrod Score: At the time of surgery this patients most appropriate activity status/level should be described as: []     0    Normal activity, no symptoms []     1    Restricted in physical strenuous activity but ambulatory, able to do out light work []     2    Ambulatory and capable of self care, unable to do work activities, up and about                 more than 50%  Of the time                            []     3    Only limited self care, in bed greater than 50% of waking hours []     4    Completely disabled, no self care, confined to bed or chair []     5    Moribund  Past Medical History:  Diagnosis Date   AAA (abdominal aortic aneurysm) without rupture (HCC)     Chronic insomnia    Herniated lumbar intervertebral disc    Hypertension    Idiopathic peripheral neuropathy    Pure hypercholesterolemia     Past Surgical History:  Procedure Laterality Date   Arm surgery     SHOULDER SURGERY      Social History   Tobacco Use  Smoking Status Former Smoker  Smokeless Tobacco Never Used    Social History   Substance and Sexual Activity  Alcohol Use Yes   Comment: 14     No Known Allergies  Current Facility-Administered Medications  Medication Dose Route Frequency Provider Last Rate Last Dose   0.9 %  sodium chloride infusion  250 mL Intravenous PRN Leonie Man, MD       acetaminophen (TYLENOL) tablet 650 mg  650 mg Oral Q4H PRN Leonie Man, MD   650 mg at 11/26/18 2110   amLODipine (NORVASC) tablet 5 mg  5 mg Oral Daily Leonie Man, MD   5 mg at 11/28/18 [id number]   aspirin EC tablet 81 mg  81 mg Oral Daily Leonie Man, MD   [phone]  mg at 11/28/18 0918   atorvastatin (LIPITOR) tablet 80 mg  80 mg Oral q1800 Leonie Man, MD   80 mg at 11/28/18 1700   docusate sodium (COLACE) capsule 100 mg  100 mg Oral BID Geradine Girt, DO   100 mg at 11/28/18 [id number]   doxazosin (CARDURA) tablet 4 mg  4 mg Oral BID Leonie Man, MD   4 mg at [id number]   folic acid (FOLVITE) tablet 1 mg  1 mg Oral Daily Leonie Man, MD   1 mg at 11/28/18 [id number]   heparin ADULT infusion 100 units/mL (25000 units/265mL sodium chloride 0.45%)  1,350 Units/hr Intravenous Continuous Geradine Girt, DO 13.5 mL/hr at 11/28/18 1600 1,350 Units/hr at 11/28/18 1600   hydrALAZINE (APRESOLINE) injection 10 mg  10 mg Intravenous Q6H PRN Leonie Man, MD       lisinopril (ZESTRIL) tablet 40 mg  40 mg Oral Daily Leonie Man, MD   40 mg at 11/28/18 [id number]   multivitamin with minerals tablet 1 tablet  1 tablet Oral Daily Leonie Man, MD   1 tablet at 11/28/18 [id number]   nitroGLYCERIN (NITROSTAT) SL tablet 0.4 mg  0.4 mg Sublingual Q5 min  PRN Leonie Man, MD   0.4 mg at 11/25/18 0006   ondansetron [facility] injection 4 mg  4 mg Intravenous Q6H PRN Leonie Man, MD       senna [facility]) tablet 8.6 mg  1 tablet Oral Daily PRN Eulogio Bear U, DO   8.6 mg at 11/27/18 1238   sodium chloride flush (NS) 0.9 % injection 3 mL  3 mL Intravenous Q12H Leonie Man, MD   3 mL at 11/27/18 2158   sodium chloride flush (NS) 0.9 % injection 3 mL  3 mL Intravenous PRN Leonie Man, MD       thiamine (VITAMIN B-1) tablet 100 mg  100 mg Oral Daily Leonie Man, MD   100 mg at 11/28/18 [id number]   Or   thiamine (B-1) injection 100 mg  100 mg Intravenous Daily Leonie Man, MD        Medications Prior to Admission  Medication Sig Dispense Refill Last Dose   amLODipine (NORVASC) 5 MG tablet Take 5 mg by mouth 2 (two) times daily.   11/25/2018 at Unknown time   aspirin EC 81 MG tablet Take 81 mg by mouth daily.   11/25/2018 at Unknown time   Cyanocobalamin (VITAMIN B-12 PO) Take 1 tablet by mouth daily.   Past Week at Unknown time   doxazosin (CARDURA) 4 MG tablet Take 4 mg by mouth 2 (two) times daily.   11/25/2018 at Unknown time   metoprolol (LOPRESSOR) 100 MG tablet Take 100 mg by mouth 2 (two) times daily.   11/25/2018 at 1030   Multiple Vitamins-Minerals (MULTIVITAMIN PO) Take 1 tablet by mouth daily.   11/25/2018 at Unknown time   quinapril (ACCUPRIL) 40 MG tablet Take 1 tablet (40 mg total) by mouth daily. (Patient taking differently: Take 40 mg by mouth at bedtime. ) 90 tablet 1 Past Week at Unknown time    Family History  Problem Relation Age of Onset   Hypertension Mother    Hypertension Father      Review of Systems:   ROS Pertinent items noted in HPI and remainder of comprehensive ROS otherwise negative.     Cardiac Review of Systems: Y or  [    ]= no  Chest Pain [    ]  Resting SOB [   ] Exertional SOB  [  ]  Orthopnea [  ]   Pedal Edema [   ]    Palpitations [  ] Syncope  [  ]   Presyncope  [   ]  General Review of Systems: [Y] = yes [  ]=no Constitional: recent weight change [  ]; anorexia [  ]; fatigue [  ]; nausea [  ]; night sweats [  ]; fever [  ]; or chills [  ]                                                               Dental: Last Dentist visit:   Eye : blurred vision [  ]; diplopia [   ]; vision changes [  ];  Amaurosis fugax[  ]; Resp: cough [  ];  wheezing[  ];  hemoptysis[  ]; shortness of breath[  ]; paroxysmal nocturnal dyspnea[  ]; dyspnea on exertion[  ]; or orthopnea[  ];  GI:  gallstones[  ], vomiting[  ];  dysphagia[  ]; melena[  ];  hematochezia [  ]; heartburn[  ];   Hx of  Colonoscopy[  ]; GU: kidney stones [  ]; hematuria[  ];   dysuria [  ];  nocturia[  ];  history of     obstruction [  ]; urinary frequency [  ]             Skin: rash, swelling[  ];, hair loss[  ];  peripheral edema[  ];  or itching[  ]; Musculosketetal: myalgias[  ];  joint swelling[  ];  joint erythema[  ];  joint pain[  ];  back pain[  ];  Heme/Lymph: bruising[  ];  bleeding[  ];  anemia[  ];  Neuro: TIA[  ];  headaches[  ];  stroke[  ];  vertigo[  ];  seizures[  ];   paresthesias[  ];  difficulty walking[  ];  Psych:depression[  ]; anxiety[  ];  Endocrine: diabetes[  ];  thyroid dysfunction[  ];              Physical Exam: BP 119/67 (BP Location: Left Arm)    Pulse 68    Temp 98.4 F (36.9 C) (Oral)    Resp 16    Ht 6' (1.829 m)    Wt 95.4 kg    SpO2 98%    BMI 28.54 kg/m    General appearance: alert and cooperative Head: Normocephalic, without obvious abnormality, atraumatic Neck: no adenopathy, no carotid bruit, no JVD, supple, symmetrical, trachea midline and thyroid not enlarged, symmetric, no tenderness/mass/nodules Resp: clear to auscultation bilaterally Cardio: Bradycardia GI: soft, non-tender; bowel sounds normal; no masses,  no organomegaly Extremities: extremities normal, atraumatic, no cyanosis or edema Neurologic: Alert and oriented X 3, normal strength and  tone. Normal symmetric reflexes. Normal coordination and gait  Diagnostic Studies & Laboratory data:     Recent Radiology Findings:   Vas US Doppler Pre Cabg  Result Date: 11/28/2018 PREOPERATIVE VASCULAR EVALUATION  Indications:      Pre-surgical evaluation. Risk Factors:     Hypertension, hyperlipidemia. Comparison Study: No prior study. Performing Technologist: Maudry Mayhew MHA, RDMS, RVT, RDCS  Examination Guidelines: A complete evaluation includes B-mode  imaging, spectral Doppler, color Doppler, and power Doppler as needed of all accessible portions of each vessel. Bilateral testing is considered an integral part of a complete examination. Limited examinations for reoccurring indications may be performed as noted.  Right Carotid Findings: +----------+--------+--------+--------+--------------------------+--------+             PSV cm/s EDV cm/s Stenosis Describe                   Comments  +----------+--------+--------+--------+--------------------------+--------+  CCA Prox   81       12                                                     +----------+--------+--------+--------+--------------------------+--------+  CCA Distal 93       12                smooth and heterogenous              +----------+--------+--------+--------+--------------------------+--------+  ICA Prox   81       15                smooth and heterogenous              +----------+--------+--------+--------+--------------------------+--------+  ICA Distal 80       16                                                     +----------+--------+--------+--------+--------------------------+--------+  ECA        125                        irregular and heterogenous           +----------+--------+--------+--------+--------------------------+--------+ Portions of this table do not appear on this page. +----------+--------+-------+----------------+------------+             PSV cm/s EDV cms Describe         Arm Pressure   +----------+--------+-------+----------------+------------+  Subclavian 169              Multiphasic, WNL 147           +----------+--------+-------+----------------+------------+ +---------+--------+--+--------+-+---------+  Vertebral PSV cm/s 30 EDV cm/s 5 Antegrade  +---------+--------+--+--------+-+---------+ Left Carotid Findings: +----------+--------+--------+--------+--------+--------+             PSV cm/s EDV cm/s Stenosis Describe Comments  +----------+--------+--------+--------+--------+--------+  CCA Prox   158      14                                   +----------+--------+--------+--------+--------+--------+  CCA Distal 100      14                                   +----------+--------+--------+--------+--------+--------+  ICA Prox   67       12                                   +----------+--------+--------+--------+--------+--------+  ICA Distal 86  20                                   +----------+--------+--------+--------+--------+--------+  ECA        93       6                                    +----------+--------+--------+--------+--------+--------+ +----------+--------+--------+--------+------------+  Subclavian PSV cm/s EDV cm/s Describe Arm Pressure  +----------+--------+--------+--------+------------+             125                        138           +----------+--------+--------+--------+------------+ +---------+--------+--+--------+--+  Vertebral PSV cm/s 66 EDV cm/s 12  +---------+--------+--+--------+--+  ABI Findings: +--------+------------------+-----+---------+--------+  Right    Rt Pressure (mmHg) Index Waveform  Comment   +--------+------------------+-----+---------+--------+  Brachial 147                      triphasic           +--------+------------------+-----+---------+--------+  PTA                               triphasic           +--------+------------------+-----+---------+--------+  DP                                triphasic            +--------+------------------+-----+---------+--------+ +--------+------------------+-----+---------+-------+  Left     Lt Pressure (mmHg) Index Waveform  Comment  +--------+------------------+-----+---------+-------+  Brachial 138                      triphasic          +--------+------------------+-----+---------+-------+  PTA                               triphasic          +--------+------------------+-----+---------+-------+  DP                                triphasic          +--------+------------------+-----+---------+-------+  Right Doppler Findings: +-----------+--------+-----+---------+--------------------+  Site        Pressure Index Doppler   Comments              +-----------+--------+-----+---------+--------------------+  Brachial    147            triphasic                       +-----------+--------+-----+---------+--------------------+  Radial                     triphasic                       +-----------+--------+-----+---------+--------------------+  Ulnar                      triphasic                       +-----------+--------+-----+---------+--------------------+  Palmar Arch                          Within normal limits  +-----------+--------+-----+---------+--------------------+  Left Doppler Findings: +-----------+--------+-----+---------+-----------------------------------------+  Site        Pressure Index Doppler   Comments                                   +-----------+--------+-----+---------+-----------------------------------------+  Brachial    138            triphasic                                            +-----------+--------+-----+---------+-----------------------------------------+  Radial                     triphasic                                            +-----------+--------+-----+---------+-----------------------------------------+  Ulnar                      triphasic                                             +-----------+--------+-----+---------+-----------------------------------------+  Palmar Arch                          Signal is unaffected with radial                                                 compression, obliterates with ulnar                                              compression.                               +-----------+--------+-----+---------+-----------------------------------------+  Summary: Right Carotid: Velocities in the right ICA are consistent with a 1-39% stenosis. Left Carotid: Velocities in the left ICA are consistent with a 1-39% stenosis. Vertebrals:  Bilateral vertebral arteries demonstrate antegrade flow. Subclavians: Normal flow hemodynamics were seen in bilateral subclavian              arteries. Right Upper Extremity: No significant arterial obstruction detected in the right upper extremity. Left Upper Extremity: No significant arterial obstruction detected in the left upper extremity.  Lower Extremities: Bilateral pedal waveforms are within normal limits. Electronically signed by Monica Martinez MD on 11/28/2018 at 5:04:32 PM.    Final      I have independently reviewed the above radiologic studies and discussed with the patient   Recent Lab Findings: Lab Results  Component Value Date   WBC 6.3 11/28/2018   HGB 14.9 11/28/2018   HCT 41.3  11/28/2018   PLT 125 (L) 11/28/2018   GLUCOSE 98 11/28/2018   CHOL 213 (H) 11/26/2018   TRIG 254 (H) 11/26/2018   HDL 31 (L) 11/26/2018   LDLCALC 131 (H) 11/26/2018   ALT 23 11/26/2018   AST 21 11/26/2018   NA 144 11/28/2018   K 3.7 11/28/2018   CL 106 11/28/2018   CREATININE 1.04 11/28/2018   BUN 9 11/28/2018   CO2 24 11/28/2018   TSH 1.468 11/25/2018   INR 1.1 11/25/2018   HGBA1C 5.2 11/25/2018      Assessment / Plan:      73 year old gentleman with crescendo angina and complicated LAD anatomy.  He is a great candidate for CABG which can likely be accomplished with 2 mammary artery grafts for complete  revascularization; this is currently scheduled for Tuesday.  Have discussed the diagnoses and plan with the patient and his wife who have had their questions answered to apparent satisfaction and wish to proceed.  For allowing [place] to participate in the care of this gentleman.   I  spent 30 minutes counseling the patient face to face.   Curtis Weiss Z. Orvan Seen, [place] 11/28/2018 6:40 PM

## 2018-11-28 NOTE — Progress Notes (Signed)
Pt had an uneventful day. Denied chest pain throughout my shift. Pt received education regarding the use of incentive spirometer and the video on pre and post cardiac surgery, pt confirmed understanding and all questions addressed. Continue with plan of care.

## 2018-11-28 NOTE — Progress Notes (Signed)
ANTICOAGULATION CONSULT NOTE - Follow Up Consult  Pharmacy Consult for heparin Indication: chest pain/ACS  No Known Allergies  Patient Measurements: Height: 6' (182.9 cm) Weight: 210 lb 6.4 oz (95.4 kg) IBW/kg (Calculated) : 77.6 Heparin Dosing Weight: 96 kg  Vital Signs: Temp: 97.7 F (36.5 C) (10/25 0423) Temp Source: Oral (10/25 0423) BP: 142/72 (10/25 0423) Pulse Rate: 45 (10/25 0423)  Labs: Recent Labs    11/25/18 1311 11/25/18 1522 11/25/18 2143 11/26/18 0025  11/27/18 0646 11/27/18 1639 11/28/18 0421  HGB 15.1  --   --  14.8  --  14.4  --  14.9  HCT 42.6  --   --  41.0  --  40.6  --  41.3  PLT 142*  --   --  137*  --  123*  --  125*  APTT  --   --  43*  --   --   --   --   --   LABPROT  --   --  13.7  --   --   --   --   --   INR  --   --  1.1  --   --   --   --   --   HEPARINUNFRC  --   --   --  0.43   < > 0.22* 0.37 0.40  CREATININE 1.05  --   --  1.11  --  0.98  --  1.04  CKTOTAL  --  126  --   --   --   --   --   --   TROPONINIHS 108* 91*  --   --   --   --   --   --    < > = values in this interval not displayed.    Estimated Creatinine Clearance: 75.8 mL/min (by C-G formula based on SCr of 1.04 mg/dL).  Assessment: 73 yr old male admitted with NSTEMI started on IV heparin. He is not on anticoagulants prior to admission. He is now s/p cath, CABG planned for Tuesday, 10/27.   Heparin therapeutic today at 0.40. H&H stable at 14.9/41.3. No signs or symptoms of bleeding.  Goal of Therapy:  Heparin level 0.3-0.7 units/ml Monitor platelets by anticoagulation protocol: Yes   Plan:  Continue heparin 1350 units/hour Daily heparin level and CBC     Thank you for allowing pharmacy to participate in this patient's care.  Izaiah Tabb L. Devin Going, [place] PGY1 Pharmacy Resident 11/28/18      8:52 AM  Please check AMION for all [place] phone numbers After 10:00 PM, call the [facility] [phone]

## 2018-11-29 ENCOUNTER — Encounter (HOSPITAL_COMMUNITY): Payer: Self-pay | Admitting: Cardiology

## 2018-11-29 ENCOUNTER — Inpatient Hospital Stay (HOSPITAL_COMMUNITY): Payer: Medicare HMO

## 2018-11-29 DIAGNOSIS — I2510 Atherosclerotic heart disease of native coronary artery without angina pectoris: Secondary | ICD-10-CM | POA: Diagnosis not present

## 2018-11-29 DIAGNOSIS — R001 Bradycardia, unspecified: Secondary | ICD-10-CM | POA: Diagnosis not present

## 2018-11-29 DIAGNOSIS — I10 Essential (primary) hypertension: Secondary | ICD-10-CM | POA: Diagnosis not present

## 2018-11-29 DIAGNOSIS — I714 Abdominal aortic aneurysm, without rupture: Secondary | ICD-10-CM | POA: Diagnosis not present

## 2018-11-29 LAB — PULMONARY FUNCTION TEST
FEF 25-75 Pre: 3.22 L/sec
FEF2575-%Pred-Pre: 128 %
FEV1-%Pred-Pre: 102 %
FEV1-Pre: 3.5 L
FEV1FVC-%Pred-Pre: 106 %
FEV6-%Pred-Pre: 101 %
FEV6-Pre: 4.45 L
FEV6FVC-%Pred-Pre: 105 %
FVC-%Pred-Pre: 96 %
FVC-Pre: 4.49 L
Pre FEV1/FVC ratio: 78 %
Pre FEV6/FVC Ratio: 100 %

## 2018-11-29 LAB — TYPE AND SCREEN
ABO/RH(D): A POS
Antibody Screen: NEGATIVE

## 2018-11-29 LAB — CBC
HCT: 42.1 % (ref 39.0–52.0)
Hemoglobin: 14.9 g/dL (ref 13.0–17.0)
MCH: 35.6 pg — ABNORMAL HIGH (ref 26.0–34.0)
MCHC: 35.4 g/dL (ref 30.0–36.0)
MCV: 100.7 fL — ABNORMAL HIGH (ref 80.0–100.0)
Platelets: 128 10*3/uL — ABNORMAL LOW (ref 150–400)
RBC: 4.18 MIL/uL — ABNORMAL LOW (ref 4.22–5.81)
RDW: 12.2 % (ref 11.5–15.5)
WBC: 5.9 10*3/uL (ref 4.0–10.5)
nRBC: 0 % (ref 0.0–0.2)

## 2018-11-29 LAB — BLOOD GAS, ARTERIAL
Acid-Base Excess: 0.8 mmol/L (ref 0.0–2.0)
Bicarbonate: 25 mmol/L (ref 20.0–28.0)
Drawn by: 535471
FIO2: 0.21
O2 Saturation: 95.2 %
Patient temperature: 98.6
pCO2 arterial: 40.7 mmHg (ref 32.0–48.0)
pH, Arterial: 7.405 (ref 7.350–7.450)
pO2, Arterial: 74.9 mmHg — ABNORMAL LOW (ref 83.0–108.0)

## 2018-11-29 LAB — RENAL FUNCTION PANEL
Albumin: 3.7 g/dL (ref 3.5–5.0)
Anion gap: 10 (ref 5–15)
BUN: 10 mg/dL (ref 8–23)
CO2: 24 mmol/L (ref 22–32)
Calcium: 9.2 mg/dL (ref 8.9–10.3)
Chloride: 108 mmol/L (ref 98–111)
Creatinine, Ser: 1.05 mg/dL (ref 0.61–1.24)
GFR calc Af Amer: >60 mL/min (ref >60)
GFR calc non Af Amer: >60 mL/min (ref >60)
Glucose, Bld: 105 mg/dL — ABNORMAL HIGH (ref 70–99)
Phosphorus: 3.9 mg/dL (ref 2.5–4.6)
Potassium: 3.6 mmol/L (ref 3.5–5.1)
Sodium: 142 mmol/L (ref 135–145)

## 2018-11-29 LAB — HEPARIN LEVEL (UNFRACTIONATED): Heparin Unfractionated: 0.31 IU/mL (ref 0.30–0.70)

## 2018-11-29 LAB — ABO/RH: ABO/RH(D): A POS

## 2018-11-29 MED ORDER — POTASSIUM CHLORIDE 2 MEQ/ML IV SOLN
80.0000 meq | INTRAVENOUS | Status: DC
  Filled 2018-11-29: qty 40

## 2018-11-29 MED ORDER — TRANEXAMIC ACID (OHS) PUMP PRIME SOLUTION
2.0000 mg/kg | INTRAVENOUS | Status: DC
  Filled 2018-11-29: qty 1.91

## 2018-11-29 MED ORDER — PHENYLEPHRINE HCL-NACL 20-0.9 MG/250ML-% IV SOLN
30.0000 ug/min | INTRAVENOUS | Status: AC
  Administered 2018-11-30: 15 ug/min via INTRAVENOUS
  Filled 2018-11-29: qty 250

## 2018-11-29 MED ORDER — TRANEXAMIC ACID 1000 MG/10ML IV SOLN
1.5000 mg/kg/h | INTRAVENOUS | Status: DC
  Filled 2018-11-29: qty 25

## 2018-11-29 MED ORDER — CHLORHEXIDINE GLUCONATE CLOTH 2 % EX PADS
6.0000 | MEDICATED_PAD | Freq: Once | CUTANEOUS | Status: AC
  Administered 2018-11-29: 6 via TOPICAL

## 2018-11-29 MED ORDER — VANCOMYCIN HCL 1000 MG IV SOLR
INTRAVENOUS | Status: DC
  Filled 2018-11-29: qty 1000

## 2018-11-29 MED ORDER — METOPROLOL TARTRATE 12.5 MG HALF TABLET
12.5000 mg | ORAL_TABLET | Freq: Once | ORAL | Status: DC
  Filled 2018-11-29: qty 1

## 2018-11-29 MED ORDER — DEXMEDETOMIDINE HCL IN NACL 400 MCG/100ML IV SOLN
0.1000 ug/kg/h | INTRAVENOUS | Status: DC
  Filled 2018-11-29: qty 100

## 2018-11-29 MED ORDER — MILRINONE LACTATE IN DEXTROSE 20-5 MG/100ML-% IV SOLN
0.3000 ug/kg/min | INTRAVENOUS | Status: DC
  Filled 2018-11-29: qty 100

## 2018-11-29 MED ORDER — NITROGLYCERIN IN D5W 200-5 MCG/ML-% IV SOLN
2.0000 ug/min | INTRAVENOUS | Status: AC
  Administered 2018-11-30: 5 ug/min via INTRAVENOUS
  Filled 2018-11-29: qty 250

## 2018-11-29 MED ORDER — POTASSIUM CHLORIDE CRYS ER 20 MEQ PO TBCR
20.0000 meq | EXTENDED_RELEASE_TABLET | Freq: Once | ORAL | Status: AC
  Administered 2018-11-29: 20 meq via ORAL
  Filled 2018-11-29: qty 1

## 2018-11-29 MED ORDER — EPINEPHRINE HCL 5 MG/250ML IV SOLN IN NS
0.0000 ug/min | INTRAVENOUS | Status: DC
  Filled 2018-11-29: qty 250

## 2018-11-29 MED ORDER — INSULIN REGULAR(HUMAN) IN NACL 100-0.9 UT/100ML-% IV SOLN
INTRAVENOUS | Status: AC
  Administered 2018-11-30: 1 [IU]/h via INTRAVENOUS
  Filled 2018-11-29: qty 100

## 2018-11-29 MED ORDER — MAGNESIUM SULFATE 50 % IJ SOLN
40.0000 meq | INTRAMUSCULAR | Status: DC
  Filled 2018-11-29: qty 9.85

## 2018-11-29 MED ORDER — TRANEXAMIC ACID (OHS) BOLUS VIA INFUSION
15.0000 mg/kg | INTRAVENOUS | Status: AC
  Administered 2018-11-30: 1429.5 mg via INTRAVENOUS
  Filled 2018-11-29: qty 1430

## 2018-11-29 MED ORDER — BISACODYL 5 MG PO TBEC
5.0000 mg | DELAYED_RELEASE_TABLET | Freq: Once | ORAL | Status: AC
  Administered 2018-11-29: 5 mg via ORAL
  Filled 2018-11-29: qty 1

## 2018-11-29 MED ORDER — TEMAZEPAM 15 MG PO CAPS: 15.0000 mg | ORAL_CAPSULE | Freq: Once | ORAL | Status: DC | PRN

## 2018-11-29 MED ORDER — PLASMA-LYTE 148 IV SOLN
INTRAVENOUS | Status: DC
  Filled 2018-11-29: qty 2.5

## 2018-11-29 MED ORDER — SODIUM CHLORIDE 0.9 % IV SOLN
INTRAVENOUS | Status: DC
  Filled 2018-11-29: qty 30

## 2018-11-29 MED ORDER — VANCOMYCIN HCL 10 G IV SOLR
1500.0000 mg | INTRAVENOUS | Status: AC
  Administered 2018-11-30: 1500 mg via INTRAVENOUS
  Filled 2018-11-29: qty 1500

## 2018-11-29 MED ORDER — CHLORHEXIDINE GLUCONATE CLOTH 2 % EX PADS: 6.0000 | MEDICATED_PAD | Freq: Once | CUTANEOUS | Status: AC

## 2018-11-29 MED ORDER — SODIUM CHLORIDE 0.9 % IV SOLN
1.5000 g | INTRAVENOUS | Status: AC
  Administered 2018-11-30: 1.5 g via INTRAVENOUS
  Filled 2018-11-29: qty 1.5

## 2018-11-29 MED ORDER — DOPAMINE-DEXTROSE 3.2-5 MG/ML-% IV SOLN
0.0000 ug/kg/min | INTRAVENOUS | Status: DC
  Filled 2018-11-29: qty 250

## 2018-11-29 MED ORDER — CHLORHEXIDINE GLUCONATE 0.12 % MT SOLN
15.0000 mL | Freq: Once | OROMUCOSAL | Status: AC
  Administered 2018-11-30: 15 mL via OROMUCOSAL
  Filled 2018-11-29: qty 15

## 2018-11-29 MED ORDER — SODIUM CHLORIDE 0.9 % IV SOLN
750.0000 mg | INTRAVENOUS | Status: AC
  Administered 2018-11-30: 750 mg via INTRAVENOUS
  Filled 2018-11-29: qty 750

## 2018-11-29 NOTE — Progress Notes (Signed)
Progress Note  Patient Name: Curtis Weiss Date of Encounter: 11/29/2018  Primary Cardiologist: Kirk Ruths, MD   Subjective   CABG tomorrow. Feeling well. No chest pain, sob or palpitations.   Inpatient Medications    Scheduled Meds:  amLODipine  5 mg Oral Daily   aspirin EC  81 mg Oral Daily   atorvastatin  80 mg Oral q1800   docusate sodium  100 mg Oral BID   doxazosin  4 mg Oral BID   folic acid  1 mg Oral Daily   lisinopril  40 mg Oral Daily   multivitamin with minerals  1 tablet Oral Daily   sodium chloride flush  3 mL Intravenous Q12H   thiamine  100 mg Oral Daily   Or   thiamine  100 mg Intravenous Daily   Continuous Infusions:  sodium chloride     heparin 1,350 Units/hr (11/28/18 1600)   PRN Meds: sodium chloride, acetaminophen, hydrALAZINE, nitroGLYCERIN, ondansetron (ZOFRAN) IV, senna, sodium chloride flush   Vital Signs    Vitals:   11/28/18 1418 11/28/18 2003 11/28/18 2229 11/29/18 0644  BP: 119/67 (!) 151/65 (!) 142/75 133/69  Pulse: 68 (!) 54  (!) 50  Resp: 16 18  18   Temp: 98.4 F (36.9 C) 98.5 F (36.9 C)  98.2 F (36.8 C)  TempSrc: Oral Oral  Oral  SpO2: 98% 95%  96%  Weight:    95.3 kg  Height:        Intake/Output Summary (Last 24 hours) at 11/29/2018 0752 Last data filed at 11/29/2018 0400 Gross per 24 hour  Intake 888.72 ml  Output 300 ml  Net 588.72 ml   Last 3 Weights 11/29/2018 11/28/2018 11/27/2018  Weight (lbs) 210 lb 1.6 oz 210 lb 6.4 oz 214 lb 4.6 oz  Weight (kg) 95.301 kg 95.437 kg 97.2 kg      Telemetry    Sinus bradycardia at rate of 40s - Personally Reviewed  ECG    No new tracing   Physical Exam   GEN: No acute distress.   Neck: No JVD Cardiac: RRR, no murmurs, rubs, or gallops.  Respiratory: Clear to auscultation bilaterally. GI: Soft, nontender, non-distended  MS: No edema; No deformity. Neuro:  Nonfocal  Psych: Normal affect   Labs    High Sensitivity Troponin:   Recent Labs    Lab 11/25/18 0001 11/25/18 0149 11/25/18 1311 11/25/18 1522  TROPONINIHS 17 44* 108* 91*      Chemistry Recent Labs  Lab 11/25/18 0001  11/26/18 0025 11/27/18 0646 11/28/18 0421 11/29/18 0438  NA 142  --  139 140 144 142  K 3.4*  --  3.7 3.7 3.7 3.6  CL 108  --  105 109 106 108  CO2 21*  --  22 22 24 24   GLUCOSE 120*  --  98 105* 98 105*  BUN 19  --  16 11 9 10   CREATININE 1.12   < > 1.11 0.98 1.04 1.05  CALCIUM 9.4  --  9.2 9.1 9.4 9.2  PROT 8.3*  --  6.5  --   --   --   ALBUMIN 4.9  --  3.8  --   --  3.7  AST 26  --  21  --   --   --   ALT 28  --  23  --   --   --   ALKPHOS 69  --  59  --   --   --  BILITOT 0.8  --  0.9  --   --   --   GFRNONAA >60   < > >60 >60 >60 >60  GFRAA >60   < > >60 >60 >60 >60  ANIONGAP 13  --  12 9 14 10    < > = values in this interval not displayed.     Hematology Recent Labs  Lab 11/27/18 0646 11/28/18 0421 11/29/18 0438  WBC 5.6 6.3 5.9  RBC 4.08* 4.14* 4.18*  HGB 14.4 14.9 14.9  HCT 40.6 41.3 42.1  MCV 99.5 99.8 100.7*  MCH 35.3* 36.0* 35.6*  MCHC 35.5 36.1* 35.4  RDW 12.2 12.2 12.2  PLT 123* 125* 128*     Radiology    Vas US Doppler Pre Cabg  Result Date: 11/28/2018 PREOPERATIVE VASCULAR EVALUATION  Indications:      Pre-surgical evaluation. Risk Factors:     Hypertension, hyperlipidemia. Comparison Study: No prior study. Performing Technologist: Maudry Mayhew MHA, RDMS, RVT, RDCS  Examination Guidelines: A complete evaluation includes B-mode imaging, spectral Doppler, color Doppler, and power Doppler as needed of all accessible portions of each vessel. Bilateral testing is considered an integral part of a complete examination. Limited examinations for reoccurring indications may be performed as noted.  Right Carotid Findings: +----------+--------+--------+--------+--------------------------+--------+             PSV cm/s EDV cm/s Stenosis Describe                   Comments   +----------+--------+--------+--------+--------------------------+--------+  CCA Prox   81       12                                                     +----------+--------+--------+--------+--------------------------+--------+  CCA Distal 93       12                smooth and heterogenous              +----------+--------+--------+--------+--------------------------+--------+  ICA Prox   81       15                smooth and heterogenous              +----------+--------+--------+--------+--------------------------+--------+  ICA Distal 80       16                                                     +----------+--------+--------+--------+--------------------------+--------+  ECA        125                        irregular and heterogenous           +----------+--------+--------+--------+--------------------------+--------+ Portions of this table do not appear on this page. +----------+--------+-------+----------------+------------+             PSV cm/s EDV cms Describe         Arm Pressure  +----------+--------+-------+----------------+------------+  Subclavian 169              Multiphasic, WNL 147           +----------+--------+-------+----------------+------------+ +---------+--------+--+--------+-+---------+  Vertebral PSV cm/s 30  EDV cm/s 5 Antegrade  +---------+--------+--+--------+-+---------+ Left Carotid Findings: +----------+--------+--------+--------+--------+--------+             PSV cm/s EDV cm/s Stenosis Describe Comments  +----------+--------+--------+--------+--------+--------+  CCA Prox   158      14                                   +----------+--------+--------+--------+--------+--------+  CCA Distal 100      14                                   +----------+--------+--------+--------+--------+--------+  ICA Prox   67       12                                   +----------+--------+--------+--------+--------+--------+  ICA Distal 86       20                                    +----------+--------+--------+--------+--------+--------+  ECA        93       6                                    +----------+--------+--------+--------+--------+--------+ +----------+--------+--------+--------+------------+  Subclavian PSV cm/s EDV cm/s Describe Arm Pressure  +----------+--------+--------+--------+------------+             125                        138           +----------+--------+--------+--------+------------+ +---------+--------+--+--------+--+  Vertebral PSV cm/s 66 EDV cm/s 12  +---------+--------+--+--------+--+  ABI Findings: +--------+------------------+-----+---------+--------+  Right    Rt Pressure (mmHg) Index Waveform  Comment   +--------+------------------+-----+---------+--------+  Brachial 147                      triphasic           +--------+------------------+-----+---------+--------+  PTA                               triphasic           +--------+------------------+-----+---------+--------+  DP                                triphasic           +--------+------------------+-----+---------+--------+ +--------+------------------+-----+---------+-------+  Left     Lt Pressure (mmHg) Index Waveform  Comment  +--------+------------------+-----+---------+-------+  Brachial 138                      triphasic          +--------+------------------+-----+---------+-------+  PTA                               triphasic          +--------+------------------+-----+---------+-------+  DP  triphasic          +--------+------------------+-----+---------+-------+  Right Doppler Findings: +-----------+--------+-----+---------+--------------------+  Site        Pressure Index Doppler   Comments              +-----------+--------+-----+---------+--------------------+  Brachial    147            triphasic                       +-----------+--------+-----+---------+--------------------+  Radial                     triphasic                        +-----------+--------+-----+---------+--------------------+  Ulnar                      triphasic                       +-----------+--------+-----+---------+--------------------+  Palmar Arch                          Within normal limits  +-----------+--------+-----+---------+--------------------+  Left Doppler Findings: +-----------+--------+-----+---------+-----------------------------------------+  Site        Pressure Index Doppler   Comments                                   +-----------+--------+-----+---------+-----------------------------------------+  Brachial    138            triphasic                                            +-----------+--------+-----+---------+-----------------------------------------+  Radial                     triphasic                                            +-----------+--------+-----+---------+-----------------------------------------+  Ulnar                      triphasic                                            +-----------+--------+-----+---------+-----------------------------------------+  Palmar Arch                          Signal is unaffected with radial                                                 compression, obliterates with ulnar                                              compression.                               +-----------+--------+-----+---------+-----------------------------------------+  Summary: Right Carotid: Velocities in the right ICA are consistent with a 1-39% stenosis. Left Carotid: Velocities in the left ICA are consistent with a 1-39% stenosis. Vertebrals:  Bilateral vertebral arteries demonstrate antegrade flow. Subclavians: Normal flow hemodynamics were seen in bilateral subclavian              arteries. Right Upper Extremity: No significant arterial obstruction detected in the right upper extremity. Left Upper Extremity: No significant arterial obstruction detected in the left upper extremity.  Lower Extremities: Bilateral pedal  waveforms are within normal limits. Electronically signed by Monica Martinez MD on 11/28/2018 at 62:04:32 PM.    Final     Cardiac Studies   Cardiac Cath 11/26/2018 Conclusion    Ost LAD to Prox LAD lesion is 60% stenosed. Prox LAD to Mid LAD lesion is 50% stenosed.  Mid LAD lesion is 90% stenosed with 95% stenosed side branch in 1st Diag. Hazy globular calcified lesion at bifurcation  Mid LAD to Dist LAD lesion is 35% stenosed. Mid Cx lesion is 35% stenosed. Not significant  Proximal RCA: Prox RCA-1 lesion is 70% stenosed -tapering to Prox RCA-2 lesion is 90% stenosed, follwed by Prox RCA-3 lesion is 50% stenosed.  Mid RCA lesion is 99% stenosed with 90% stenosed side branch in Acute Mrg. Hazy globular calcified lesion at branch point  The left ventricular systolic function is normal.  LV end diastolic pressure is normal.  The left ventricular ejection fraction is 55-65% by visual estimate.  There is no aortic valve stenosis.  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  Moderate LCx-OM 30%  Well preserved LVEF with normal LVEDP.  RECOMMENDATIONS  Transfer to nursing unit for post cath care TR band removal.   CVTS consultation for severe calcified three-vessel disease (not good PCI target).   Will restart IV heparin 8 hours after sheath removal.  Continue aggressive risk factor modification\     Patient Profile     73 y.o. male old active gentleman with essential hypertension, sinus bradycardia since he was a teenager, hypercholesterolemia,  atherosclerosispresenting with nonexertional chest pressure with radiation to the arms,diaphoresis and shortness of breath which resolved in 15 minutes. Admitted for further evaluation.   Assessment & Plan    1.  NSTEMI -HsTrop peak at 108. Cath with severe 2 vessel CAD with poor targets for PCI>> plan CABG tomorrow. No recurrent chest pain.  -Continue ASA, IV Heparin gtt and high dose statin. -BB stopped due to bradycardia  2.  Sinus bradycardia   -HR remains in the 40's despite stopping BB -Asymptomatic  3.  HTN   -BP relatively stable -continue amlodipine 5mg  daily, doxazosin 4mg  BID and Lisinopril 40mg  daily  4.  AAA   -Hx AAA which measures 3.2 cm  5.  HLD    -intolerant to statins in past due to hair loss but willing to try again -now on Lipitor 80mg  daily -will need FLP and ALT in 6 weeks -if he does not tolerate statin then consider Repatha       For questions or updates, please contact [place] Please consult [email or web address] for contact  info under        Signed, [employer], PA  11/29/2018, 7:52 AM

## 2018-11-29 NOTE — Care Management Important Message (Signed)
Important Message  Patient Details  Name: RADEK KEITA MRN: [id number] Date of Birth: 07/23/1945   Medicare Important Message Given:  Yes     Shelda Altes 11/29/2018, 1:58 PM

## 2018-11-29 NOTE — Anesthesia Preprocedure Evaluation (Addendum)
Anesthesia Evaluation  Patient identified by MRN, date of birth, ID band Patient awake    Reviewed: Allergy & Precautions, NPO status , Patient's Chart, lab work & pertinent test results, reviewed documented beta blocker date and time   Airway Mallampati: IV  TM Distance: >3 FB Neck ROM: Full    Dental no notable dental hx. (+) Dental Advisory Given   Pulmonary former smoker,    Pulmonary exam normal breath sounds clear to auscultation       Cardiovascular hypertension, Pt. on medications and Pt. 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 Moderate LCx-OM 30% Well preserved LVEF with normal LVEDP.  ECHO: 1. Left ventricular ejection fraction, by visual estimation, is 60 to 65%. The left ventricle has normal function. Normal left ventricular size. There is mildly increased left ventricular hypertrophy.  2. Left ventricular diastolic Doppler parameters are consistent with impaired relaxation pattern of LV diastolic filling.  3. Global right ventricle has normal systolic function.The right ventricular size is normal. No increase in right ventricular wall thickness.  4. Left atrial size was moderately dilated.  5. Right atrial size was normal.  6. The mitral valve is normal in structure. Mild mitral valve regurgitation. No evidence of mitral stenosis.  7. The tricuspid valve is normal in structure. Tricuspid valve  regurgitation is mild.  8. The aortic valve is normal in structure. Aortic valve regurgitation was not visualized by color flow Doppler. Mild to moderate aortic valve sclerosis/calcification without any evidence of aortic stenosis.  9. The pulmonic valve was normal in structure. Pulmonic valve regurgitation is not visualized by color flow Doppler. 10. Mildly elevated pulmonary artery systolic pressure. 11. The inferior vena cava is normal in size with greater than 50% respiratory variability, suggesting right atrial pressure of 3 mmHg. 12. The average left ventricular global longitudinal strain is -18.3 %.   Neuro/Psych  Neuromuscular disease negative psych ROS   GI/Hepatic negative GI ROS, Neg liver ROS,   Endo/Other  negative endocrine ROS  Renal/GU negative Renal ROS     Musculoskeletal negative musculoskeletal ROS (+)   Abdominal   Peds  Hematology HLD   Anesthesia Other Findings CAD  Reproductive/Obstetrics                           Anesthesia Physical Anesthesia Plan  ASA: IV  Anesthesia Plan: General   Post-op Pain Management:    Induction: Intravenous  PONV Risk Score and Plan: 2 and Ondansetron, Dexamethasone, Midazolam and Treatment may vary due to age or medical condition  Airway Management Planned: Oral ETT  Additional Equipment: Arterial line, CVP, PA Cath, TEE and Ultrasound Guidance Line Placement  Intra-op Plan:   Post-operative Plan: Post-operative intubation/ventilation  Informed Consent: I have reviewed the patients History and Physical, chart, labs and discussed the procedure including the risks, benefits and alternatives for the proposed anesthesia with the patient or authorized representative who has indicated his/her understanding and acceptance.     Dental advisory given  Plan Discussed with: CRNA  Anesthesia Plan Comments:        Anesthesia Quick Evaluation

## 2018-11-29 NOTE — Progress Notes (Signed)
ANTICOAGULATION CONSULT NOTE - Follow Up Consult  Pharmacy Consult for heparin Indication: chest pain/ACS  No Known Allergies  Patient Measurements: Height: 6' (182.9 cm) Weight: 210 lb 1.6 oz (95.3 kg) IBW/kg (Calculated) : 77.6 Heparin Dosing Weight: 96 kg  Vital Signs: Temp: 98.2 F (36.8 C) (10/26 0644) Temp Source: Oral (10/26 0644) BP: 133/69 (10/26 0644) Pulse Rate: 50 (10/26 0644)  Labs: Recent Labs    11/27/18 0646 11/27/18 1639 11/28/18 0421 11/29/18 0438  HGB 14.4  --  14.9 14.9  HCT 40.6  --  41.3 42.1  PLT 123*  --  125* 128*  HEPARINUNFRC 0.22* 0.37 0.40 0.31  CREATININE 0.98  --  1.04 1.05    Estimated Creatinine Clearance: 75.1 mL/min (by C-G formula based on SCr of 1.05 mg/dL).  Assessment: 73 yr old male admitted with NSTEMI started on IV heparin. He isnot on anticoagulants prior to admission. He is now s/p cath, CABG planned for Tuesday, 10/27.   Heparin therapeutic but down trended 0.40 >0.31. H&H and plt stable. No signs or symptoms of bleeding. Confirmed with RN no issues with heparin infusion, has not been stopped. Will increase heparin infusion slightly to make sure HL stays in goal before CABG.   Goal of Therapy: Heparin level 0.3-0.7 units/ml Monitor platelets by anticoagulation protocol: Yes  Plan: Increase heparin to 1400 units/hour Daily heparin level and CBC F/u post-CABG plan  Benetta Spar, PharmD, BCPS, [facility] Clinical Pharmacist

## 2018-11-29 NOTE — Progress Notes (Signed)
CARDIAC REHAB PHASE I   PRE:  Rate/Rhythm: 42 SB    BP: sitting 139/80    SaO2: 96 RA  MODE:  Ambulation: 430 ft   POST:  Rate/Rhythm: 90 SR moving in room, 73 SR walking    BP: sitting 149/73     SaO2:   Tolerated well. Encouraged pt to be up moving more unless he has CP. Discussed sternal precautions, IS (2500+ mL today), mobility post op and d/c planning. Good reception. He had already read the education booklet and watched videos. His wife will be with him at d/c. [id number]   [place], ACSM 11/29/2018 10:19 AM

## 2018-11-29 NOTE — Progress Notes (Signed)
Hospitalist progress note  If 7PM-7AM,  night-coverage-look on AMION -prefer pages-not epic chat,please  Curtis Weiss  [id number] DOB: 24-Oct-1945 DOA: 11/24/2018 PCP: Drake Leach, MD  Narrative:  81 M HTN HLD bradycardia AAA 3.2 cm peripheral neuropathy EtOH heavy use admitted 10/22 playing golf central chest pain-prior symptoms in September diaphoresis and nausea-troponin XVII-->108 EKG unchanged-underwent left heart cath placed on heparin beta-blocker adjusted secondary to bradycardia Cardiac catheterization 10/23 = extensive 70 to 90% stenosis calcified 99% stenosis RV marginal branch as well as extensive calcification D1 Apparently planning for CABG Assessment & Plan: NSTEMI Planning CABG 10/27 AM-continue heoparin other meds per Cardiology Bradycardia Has been bradycardic in the 40 range since high school-defer plan to cardiology HTN Continuing amlodipine 5 lisinopril 40 AAA Outpatient monitoring needs scan in 1 year for 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 HLD Continue Lipitor 80 daily Idiopathic neuropathy Moderate alcohol use  drinks only 2 drinks a day-discontinue CIWA protocol as no signs and symptoms of withdrawal  DVT prophylaxis: Heparin  Code Status:   Full   Family Communication:   None present Disposition Plan: Unclear at this time  Consultants:   Cardiology  CVTS Procedures:   Cath 10/23 as above Antimicrobials:   None Subjective: Awake Coherent in nad No cp f Chill n/v/d  Objective: Vitals:   11/28/18 2003 11/28/18 2229 11/29/18 0644 11/29/18 1338  BP: (!) 151/65 (!) 142/75 133/69   Pulse: (!) 54  (!) 50 (!) 56  Resp: 18  18 17   Temp: 98.5 F (36.9 C)  98.2 F (36.8 C) 98.4 F (36.9 C)  TempSrc: Oral  Oral Oral  SpO2: 95%  96% 96%  Weight:   95.3 kg   Height:        Intake/Output Summary (Last 24 hours) at 11/29/2018 1357 Last data filed at 11/29/2018 1334 Gross per 24 hour  Intake 808.39 ml  Output 200 ml  Net 608.39 ml   Filed Weights   11/27/18 0500 11/28/18 0423 11/29/18 0644  Weight: 97.2 kg 95.4 kg 95.3 kg    Examination: EOMI NCAT no focal deficit smile symmetric thick neck Mallampati 2 s1 s 2 bvrady but less so abd soft Data Reviewed: I have personally reviewed following labs and imaging studies CBC: Recent Labs  Lab 11/25/18 0001 11/25/18 1311 11/26/18 0025 11/27/18 0646 11/28/18 0421 11/29/18 0438  WBC 8.2 5.9 6.4 5.6 6.3 5.9  NEUTROABS 3.9  --   --   --   --   --   HGB 16.9 15.1 14.8 14.4 14.9 14.9  HCT 47.8 42.6 41.0 40.6 41.3 42.1  MCV 99.0 99.1 98.6 99.5 99.8 100.7*  PLT 149* 142* 137* 123* 125* 0000000*   Basic Metabolic Panel: Recent Labs  Lab 11/25/18 0001 11/25/18 1311 11/25/18 1522 11/26/18 0025 11/27/18 0646 11/28/18 0421 11/29/18 0438  NA 142  --   --  139 140 144 142  K 3.4*  --   --  3.7 3.7 3.7 3.6  CL 108  --   --  105 109 106 108  CO2 21*  --   --  22 22 24 24   GLUCOSE 120*  --   --  98 105* 98 105*  BUN 19  --   --  16 11 9 10   CREATININE 1.12 1.05  --  1.11 0.98 1.04 1.05  CALCIUM 9.4  --   --  9.2 9.1 9.4 9.2  MG  --   --  2.4  --   --   --   --  PHOS  --   --  3.2  --   --   --  3.9   GFR: Estimated Creatinine Clearance: 75.1 mL/min (by C-G formula based on SCr of 1.05 mg/dL). Liver Function Tests: Recent Labs  Lab 11/25/18 0001 11/26/18 0025 11/29/18 0438  AST 26 21  --   ALT 28 23  --   ALKPHOS 69 59  --   BILITOT 0.8 0.9  --   PROT 8.3* 6.5  --   ALBUMIN 4.9 3.8 3.7   No results for input(s): LIPASE, AMYLASE in the last 168 hours. No results for input(s): AMMONIA in the last 168 hours. Coagulation Profile: Recent Labs  Lab 11/25/18 2143  INR 1.1   Cardiac Enzymes: Radiology Studies: Reviewed images personally in health database  Scheduled Meds: . amLODipine  5 mg Oral Daily  . aspirin EC  81 mg Oral Daily  . atorvastatin  80 mg Oral q1800  . docusate sodium  100 mg Oral BID  . doxazosin  4 mg Oral BID  . [START ON 11/30/2018] epinephrine  0-10  mcg/min Intravenous To OR  . folic acid  1 mg Oral Daily  . [START ON 11/30/2018] heparin-papaverine-plasmalyte irrigation   Irrigation To OR  . [START ON 11/30/2018] insulin   Intravenous To OR  . lisinopril  40 mg Oral Daily  . [START ON 11/30/2018] magnesium sulfate  40 mEq Other To OR  . [START ON 11/30/2018] metoprolol tartrate  12.5 mg Oral Once  . multivitamin with minerals  1 tablet Oral Daily  . [START ON 11/30/2018] phenylephrine  30-200 mcg/min Intravenous To OR  . [START ON 11/30/2018] potassium chloride  80 mEq Other To OR  . sodium chloride flush  3 mL Intravenous Q12H  . thiamine  100 mg Oral Daily   Or  . thiamine  100 mg Intravenous Daily  . [START ON 11/30/2018] tranexamic acid  15 mg/kg Intravenous To OR  . [START ON 11/30/2018] tranexamic acid  2 mg/kg Intracatheter To OR  . [START ON 11/30/2018] vancomycin 1000 mg in NS (1000 ml) irrigation for Dr. Roxy Manns case   Irrigation To OR   Continuous Infusions: . sodium chloride    . [START ON 11/30/2018] cefUROXime (ZINACEF)  IV    . [START ON 11/30/2018] cefUROXime (ZINACEF)  IV    . [START ON 11/30/2018] dexmedetomidine    . [START ON 11/30/2018] DOPamine    . [START ON 11/30/2018] heparin 30,000 units/NS 1000 mL solution for CELLSAVER    . heparin 1,400 Units/hr (11/29/18 1213)  . [START ON 11/30/2018] milrinone    . [START ON 11/30/2018] nitroGLYCERIN    . [START ON 11/30/2018] tranexamic acid (CYKLOKAPRON) infusion ([facility])    . [START ON 11/30/2018] vancomycin      LOS: 3 days   Time spent: [place], MD [facility]  11/29/2018, 1:57 PM

## 2018-11-30 ENCOUNTER — Inpatient Hospital Stay (HOSPITAL_COMMUNITY): Payer: Medicare HMO

## 2018-11-30 ENCOUNTER — Inpatient Hospital Stay (HOSPITAL_COMMUNITY): Payer: Medicare HMO | Admitting: Certified Registered Nurse Anesthetist

## 2018-11-30 ENCOUNTER — Encounter (HOSPITAL_COMMUNITY)
Admission: EM | Disposition: A | Discharge status: Home / Self Care | Payer: Self-pay | Source: Home / Self Care | Attending: Cardiothoracic Surgery

## 2018-11-30 ENCOUNTER — Encounter (HOSPITAL_COMMUNITY): Payer: Self-pay | Admitting: Certified Registered Nurse Anesthetist

## 2018-11-30 DIAGNOSIS — I2510 Atherosclerotic heart disease of native coronary artery without angina pectoris: Secondary | ICD-10-CM | POA: Diagnosis present

## 2018-11-30 DIAGNOSIS — Z951 Presence of aortocoronary bypass graft: Secondary | ICD-10-CM

## 2018-11-30 DIAGNOSIS — I25110 Atherosclerotic heart disease of native coronary artery with unstable angina pectoris: Secondary | ICD-10-CM

## 2018-11-30 DIAGNOSIS — I200 Unstable angina: Secondary | ICD-10-CM

## 2018-11-30 HISTORY — PX: CLIPPING OF ATRIAL APPENDAGE: SHX5773

## 2018-11-30 HISTORY — PX: TEE WITHOUT CARDIOVERSION: SHX5443

## 2018-11-30 HISTORY — PX: CORONARY ARTERY BYPASS GRAFT: SHX141

## 2018-11-30 LAB — CBC
HCT: 40.8 % (ref 39.0–52.0)
HCT: 34.4 % — ABNORMAL LOW (ref 39.0–52.0)
HCT: 35.3 % — ABNORMAL LOW (ref 39.0–52.0)
Hemoglobin: 14.4 g/dL (ref 13.0–17.0)
Hemoglobin: 11.7 g/dL — ABNORMAL LOW (ref 13.0–17.0)
Hemoglobin: 12.4 g/dL — ABNORMAL LOW (ref 13.0–17.0)
MCH: 35.1 pg — ABNORMAL HIGH (ref 26.0–34.0)
MCH: 35.1 pg — ABNORMAL HIGH (ref 26.0–34.0)
MCH: 35.6 pg — ABNORMAL HIGH (ref 26.0–34.0)
MCHC: 35.3 g/dL (ref 30.0–36.0)
MCHC: 34 g/dL (ref 30.0–36.0)
MCHC: 35.1 g/dL (ref 30.0–36.0)
MCV: 99.5 fL (ref 80.0–100.0)
MCV: 103.3 fL — ABNORMAL HIGH (ref 80.0–100.0)
MCV: 101.4 fL — ABNORMAL HIGH (ref 80.0–100.0)
Platelets: 128 10*3/uL — ABNORMAL LOW (ref 150–400)
Platelets: 75 10*3/uL — ABNORMAL LOW (ref 150–400)
Platelets: 95 10*3/uL — ABNORMAL LOW (ref 150–400)
RBC: 4.1 MIL/uL — ABNORMAL LOW (ref 4.22–5.81)
RBC: 3.33 MIL/uL — ABNORMAL LOW (ref 4.22–5.81)
RBC: 3.48 MIL/uL — ABNORMAL LOW (ref 4.22–5.81)
RDW: 12.2 % (ref 11.5–15.5)
RDW: 12.2 % (ref 11.5–15.5)
RDW: 12.4 % (ref 11.5–15.5)
WBC: 6.5 10*3/uL (ref 4.0–10.5)
WBC: 9.2 10*3/uL (ref 4.0–10.5)
WBC: 10.4 10*3/uL (ref 4.0–10.5)
nRBC: 0 % (ref 0.0–0.2)
nRBC: 0 % (ref 0.0–0.2)
nRBC: 0 % (ref 0.0–0.2)

## 2018-11-30 LAB — POCT I-STAT, CHEM 8
BUN: 10 mg/dL (ref 8–23)
BUN: 10 mg/dL (ref 8–23)
BUN: 10 mg/dL (ref 8–23)
BUN: 11 mg/dL (ref 8–23)
BUN: 11 mg/dL (ref 8–23)
Calcium, Ion: 1.19 mmol/L (ref 1.15–1.40)
Calcium, Ion: 1.07 mmol/L — ABNORMAL LOW (ref 1.15–1.40)
Calcium, Ion: 1.22 mmol/L (ref 1.15–1.40)
Calcium, Ion: 1.13 mmol/L — ABNORMAL LOW (ref 1.15–1.40)
Calcium, Ion: 1.1 mmol/L — ABNORMAL LOW (ref 1.15–1.40)
Chloride: 108 mmol/L (ref 98–111)
Chloride: 103 mmol/L (ref 98–111)
Chloride: 107 mmol/L (ref 98–111)
Chloride: 103 mmol/L (ref 98–111)
Chloride: 105 mmol/L (ref 98–111)
Creatinine, Ser: 0.7 mg/dL (ref 0.61–1.24)
Creatinine, Ser: 0.7 mg/dL (ref 0.61–1.24)
Creatinine, Ser: 0.8 mg/dL (ref 0.61–1.24)
Creatinine, Ser: 0.8 mg/dL (ref 0.61–1.24)
Creatinine, Ser: 0.8 mg/dL (ref 0.61–1.24)
Glucose, Bld: 93 mg/dL (ref 70–99)
Glucose, Bld: 125 mg/dL — ABNORMAL HIGH (ref 70–99)
Glucose, Bld: 132 mg/dL — ABNORMAL HIGH (ref 70–99)
Glucose, Bld: 163 mg/dL — ABNORMAL HIGH (ref 70–99)
Glucose, Bld: 176 mg/dL — ABNORMAL HIGH (ref 70–99)
HCT: 37 % — ABNORMAL LOW (ref 39.0–52.0)
HCT: 33 % — ABNORMAL LOW (ref 39.0–52.0)
HCT: 36 % — ABNORMAL LOW (ref 39.0–52.0)
HCT: 31 % — ABNORMAL LOW (ref 39.0–52.0)
HCT: 31 % — ABNORMAL LOW (ref 39.0–52.0)
Hemoglobin: 12.6 g/dL — ABNORMAL LOW (ref 13.0–17.0)
Hemoglobin: 11.2 g/dL — ABNORMAL LOW (ref 13.0–17.0)
Hemoglobin: 12.2 g/dL — ABNORMAL LOW (ref 13.0–17.0)
Hemoglobin: 10.5 g/dL — ABNORMAL LOW (ref 13.0–17.0)
Hemoglobin: 10.5 g/dL — ABNORMAL LOW (ref 13.0–17.0)
Potassium: 3.7 mmol/L (ref 3.5–5.1)
Potassium: 4.1 mmol/L (ref 3.5–5.1)
Potassium: 4.2 mmol/L (ref 3.5–5.1)
Potassium: 4.6 mmol/L (ref 3.5–5.1)
Potassium: 4 mmol/L (ref 3.5–5.1)
Sodium: 143 mmol/L (ref 135–145)
Sodium: 140 mmol/L (ref 135–145)
Sodium: 141 mmol/L (ref 135–145)
Sodium: 138 mmol/L (ref 135–145)
Sodium: 140 mmol/L (ref 135–145)
TCO2: 22 mmol/L (ref 22–32)
TCO2: 24 mmol/L (ref 22–32)
TCO2: 24 mmol/L (ref 22–32)
TCO2: 25 mmol/L (ref 22–32)
TCO2: 24 mmol/L (ref 22–32)

## 2018-11-30 LAB — POCT I-STAT 7, (LYTES, BLD GAS, ICA,H+H)
Acid-base deficit: 1 mmol/L (ref 0.0–2.0)
Acid-base deficit: 4 mmol/L — ABNORMAL HIGH (ref 0.0–2.0)
Acid-base deficit: 4 mmol/L — ABNORMAL HIGH (ref 0.0–2.0)
Acid-base deficit: 5 mmol/L — ABNORMAL HIGH (ref 0.0–2.0)
Acid-base deficit: 6 mmol/L — ABNORMAL HIGH (ref 0.0–2.0)
Bicarbonate: 25.7 mmol/L (ref 20.0–28.0)
Bicarbonate: 23.3 mmol/L (ref 20.0–28.0)
Bicarbonate: 22 mmol/L (ref 20.0–28.0)
Bicarbonate: 19.7 mmol/L — ABNORMAL LOW (ref 20.0–28.0)
Bicarbonate: 18.7 mmol/L — ABNORMAL LOW (ref 20.0–28.0)
Calcium, Ion: 1.06 mmol/L — ABNORMAL LOW (ref 1.15–1.40)
Calcium, Ion: 1.1 mmol/L — ABNORMAL LOW (ref 1.15–1.40)
Calcium, Ion: 1.16 mmol/L (ref 1.15–1.40)
Calcium, Ion: 1.13 mmol/L — ABNORMAL LOW (ref 1.15–1.40)
Calcium, Ion: 1.12 mmol/L — ABNORMAL LOW (ref 1.15–1.40)
HCT: 31 % — ABNORMAL LOW (ref 39.0–52.0)
HCT: 29 % — ABNORMAL LOW (ref 39.0–52.0)
HCT: 34 % — ABNORMAL LOW (ref 39.0–52.0)
HCT: 32 % — ABNORMAL LOW (ref 39.0–52.0)
HCT: 31 % — ABNORMAL LOW (ref 39.0–52.0)
Hemoglobin: 10.5 g/dL — ABNORMAL LOW (ref 13.0–17.0)
Hemoglobin: 9.9 g/dL — ABNORMAL LOW (ref 13.0–17.0)
Hemoglobin: 11.6 g/dL — ABNORMAL LOW (ref 13.0–17.0)
Hemoglobin: 10.9 g/dL — ABNORMAL LOW (ref 13.0–17.0)
Hemoglobin: 10.5 g/dL — ABNORMAL LOW (ref 13.0–17.0)
O2 Saturation: 100 %
O2 Saturation: 98 %
O2 Saturation: 96 %
O2 Saturation: 97 %
O2 Saturation: 99 %
Patient temperature: 35.4
Patient temperature: 98.7
Patient temperature: 98.3
Potassium: 4.2 mmol/L (ref 3.5–5.1)
Potassium: 4 mmol/L (ref 3.5–5.1)
Potassium: 4.3 mmol/L (ref 3.5–5.1)
Potassium: 4.1 mmol/L (ref 3.5–5.1)
Potassium: 3.9 mmol/L (ref 3.5–5.1)
Sodium: 141 mmol/L (ref 135–145)
Sodium: 140 mmol/L (ref 135–145)
Sodium: 142 mmol/L (ref 135–145)
Sodium: 142 mmol/L (ref 135–145)
Sodium: 143 mmol/L (ref 135–145)
TCO2: 27 mmol/L (ref 22–32)
TCO2: 25 mmol/L (ref 22–32)
TCO2: 23 mmol/L (ref 22–32)
TCO2: 21 mmol/L — ABNORMAL LOW (ref 22–32)
TCO2: 20 mmol/L — ABNORMAL LOW (ref 22–32)
pCO2 arterial: 49.2 mmHg — ABNORMAL HIGH (ref 32.0–48.0)
pCO2 arterial: 50.4 mmHg — ABNORMAL HIGH (ref 32.0–48.0)
pCO2 arterial: 40.8 mmHg (ref 32.0–48.0)
pCO2 arterial: 36.3 mmHg (ref 32.0–48.0)
pCO2 arterial: 34.7 mmHg (ref 32.0–48.0)
pH, Arterial: 7.326 — ABNORMAL LOW (ref 7.350–7.450)
pH, Arterial: 7.273 — ABNORMAL LOW (ref 7.350–7.450)
pH, Arterial: 7.333 — ABNORMAL LOW (ref 7.350–7.450)
pH, Arterial: 7.343 — ABNORMAL LOW (ref 7.350–7.450)
pH, Arterial: 7.339 — ABNORMAL LOW (ref 7.350–7.450)
pO2, Arterial: 363 mmHg — ABNORMAL HIGH (ref 83.0–108.0)
pO2, Arterial: 127 mmHg — ABNORMAL HIGH (ref 83.0–108.0)
pO2, Arterial: 79 mmHg — ABNORMAL LOW (ref 83.0–108.0)
pO2, Arterial: 92 mmHg (ref 83.0–108.0)
pO2, Arterial: 125 mmHg — ABNORMAL HIGH (ref 83.0–108.0)

## 2018-11-30 LAB — PROTIME-INR
INR: 1.3 — ABNORMAL HIGH (ref 0.8–1.2)
Prothrombin Time: 16 seconds — ABNORMAL HIGH (ref 11.4–15.2)

## 2018-11-30 LAB — GLUCOSE, CAPILLARY
Glucose-Capillary: 121 mg/dL — ABNORMAL HIGH (ref 70–99)
Glucose-Capillary: 153 mg/dL — ABNORMAL HIGH (ref 70–99)
Glucose-Capillary: 130 mg/dL — ABNORMAL HIGH (ref 70–99)
Glucose-Capillary: 107 mg/dL — ABNORMAL HIGH (ref 70–99)
Glucose-Capillary: 116 mg/dL — ABNORMAL HIGH (ref 70–99)
Glucose-Capillary: 119 mg/dL — ABNORMAL HIGH (ref 70–99)
Glucose-Capillary: 97 mg/dL (ref 70–99)
Glucose-Capillary: 126 mg/dL — ABNORMAL HIGH (ref 70–99)
Glucose-Capillary: 116 mg/dL — ABNORMAL HIGH (ref 70–99)
Glucose-Capillary: 115 mg/dL — ABNORMAL HIGH (ref 70–99)
Glucose-Capillary: 108 mg/dL — ABNORMAL HIGH (ref 70–99)

## 2018-11-30 LAB — ECHO INTRAOPERATIVE TEE
Height: 72 in
Weight: 3355.2 oz

## 2018-11-30 LAB — BASIC METABOLIC PANEL
Anion gap: 10 (ref 5–15)
Anion gap: 6 (ref 5–15)
BUN: 13 mg/dL (ref 8–23)
BUN: 10 mg/dL (ref 8–23)
CO2: 22 mmol/L (ref 22–32)
CO2: 18 mmol/L — ABNORMAL LOW (ref 22–32)
Calcium: 9.1 mg/dL (ref 8.9–10.3)
Calcium: 7.4 mg/dL — ABNORMAL LOW (ref 8.9–10.3)
Chloride: 107 mmol/L (ref 98–111)
Chloride: 110 mmol/L (ref 98–111)
Creatinine, Ser: 0.98 mg/dL (ref 0.61–1.24)
Creatinine, Ser: 1.02 mg/dL (ref 0.61–1.24)
GFR calc Af Amer: >60 mL/min (ref >60)
GFR calc Af Amer: >60 mL/min (ref >60)
GFR calc non Af Amer: >60 mL/min (ref >60)
GFR calc non Af Amer: >60 mL/min (ref >60)
Glucose, Bld: 110 mg/dL — ABNORMAL HIGH (ref 70–99)
Glucose, Bld: 134 mg/dL — ABNORMAL HIGH (ref 70–99)
Potassium: 3.8 mmol/L (ref 3.5–5.1)
Potassium: 4 mmol/L (ref 3.5–5.1)
Sodium: 139 mmol/L (ref 135–145)
Sodium: 134 mmol/L — ABNORMAL LOW (ref 135–145)

## 2018-11-30 LAB — HEMOGLOBIN AND HEMATOCRIT, BLOOD
HCT: 31.2 % — ABNORMAL LOW (ref 39.0–52.0)
Hemoglobin: 10.7 g/dL — ABNORMAL LOW (ref 13.0–17.0)

## 2018-11-30 LAB — APTT: aPTT: 27 seconds (ref 24–36)

## 2018-11-30 LAB — PLATELET COUNT: Platelets: 111 10*3/uL — ABNORMAL LOW (ref 150–400)

## 2018-11-30 LAB — MAGNESIUM: Magnesium: 3.1 mg/dL — ABNORMAL HIGH (ref 1.7–2.4)

## 2018-11-30 LAB — HEPARIN LEVEL (UNFRACTIONATED): Heparin Unfractionated: 0.38 IU/mL (ref 0.30–0.70)

## 2018-11-30 SURGERY — CORONARY ARTERY BYPASS GRAFTING (CABG)
Anesthesia: General | Site: Chest

## 2018-11-30 MED ORDER — ROCURONIUM BROMIDE 10 MG/ML (PF) SYRINGE
PREFILLED_SYRINGE | INTRAVENOUS | Status: DC | PRN
  Administered 2018-11-30: 50 mg via INTRAVENOUS
  Administered 2018-11-30: 40 mg via INTRAVENOUS
  Administered 2018-11-30: 80 mg via INTRAVENOUS

## 2018-11-30 MED ORDER — PANTOPRAZOLE SODIUM 40 MG PO TBEC
40.0000 mg | DELAYED_RELEASE_TABLET | Freq: Every day | ORAL | Status: DC
  Administered 2018-12-02 – 2018-12-04 (×3): 40 mg via ORAL
  Filled 2018-11-30 (×3): qty 1

## 2018-11-30 MED ORDER — LACTATED RINGERS IV SOLN: 500.0000 mL | Freq: Once | INTRAVENOUS | Status: DC | PRN

## 2018-11-30 MED ORDER — ACETAMINOPHEN 500 MG PO TABS
1000.0000 mg | ORAL_TABLET | Freq: Four times a day (QID) | ORAL | Status: DC
  Administered 2018-11-30 – 2018-12-04 (×14): 1000 mg via ORAL
  Filled 2018-11-30 (×14): qty 2

## 2018-11-30 MED ORDER — EPHEDRINE SULFATE-NACL 50-0.9 MG/10ML-% IV SOSY
PREFILLED_SYRINGE | INTRAVENOUS | Status: DC | PRN
  Administered 2018-11-30: 5 mg via INTRAVENOUS

## 2018-11-30 MED ORDER — PROTAMINE SULFATE 10 MG/ML IV SOLN
INTRAVENOUS | Status: AC
  Filled 2018-11-30: qty 10

## 2018-11-30 MED ORDER — POTASSIUM CHLORIDE 10 MEQ/50ML IV SOLN: 10.0000 meq | INTRAVENOUS | Status: AC

## 2018-11-30 MED ORDER — HEPARIN SODIUM (PORCINE) 1000 UNIT/ML IJ SOLN
INTRAMUSCULAR | Status: AC
  Filled 2018-11-30: qty 1

## 2018-11-30 MED ORDER — FENTANYL CITRATE (PF) 250 MCG/5ML IJ SOLN
INTRAMUSCULAR | Status: AC
  Filled 2018-11-30: qty 25

## 2018-11-30 MED ORDER — DOCUSATE SODIUM 100 MG PO CAPS
200.0000 mg | ORAL_CAPSULE | Freq: Every day | ORAL | Status: DC
  Administered 2018-12-01 – 2018-12-04 (×2): 200 mg via ORAL
  Filled 2018-11-30 (×3): qty 2

## 2018-11-30 MED ORDER — 0.9 % SODIUM CHLORIDE (POUR BTL) OPTIME
TOPICAL | Status: DC | PRN
  Administered 2018-11-30: 5000 mL

## 2018-11-30 MED ORDER — ACETAMINOPHEN 650 MG RE SUPP
650.0000 mg | Freq: Once | RECTAL | Status: AC
  Administered 2018-11-30: 650 mg via RECTAL

## 2018-11-30 MED ORDER — OXYCODONE HCL 5 MG PO TABS
5.0000 mg | ORAL_TABLET | ORAL | Status: DC | PRN
  Administered 2018-11-30 – 2018-12-04 (×6): 5 mg via ORAL
  Filled 2018-11-30 (×6): qty 1

## 2018-11-30 MED ORDER — NITROGLYCERIN IN D5W 200-5 MCG/ML-% IV SOLN
0.0000 ug/min | INTRAVENOUS | Status: DC
  Administered 2018-11-30: 15 ug/min via INTRAVENOUS

## 2018-11-30 MED ORDER — MANNITOL 20 % IV SOLN
Freq: Once | INTRAVENOUS | Status: DC
  Filled 2018-11-30: qty 13

## 2018-11-30 MED ORDER — BISACODYL 5 MG PO TBEC
10.0000 mg | DELAYED_RELEASE_TABLET | Freq: Every day | ORAL | Status: DC
  Administered 2018-12-01 – 2018-12-03 (×2): 10 mg via ORAL
  Filled 2018-11-30 (×3): qty 2

## 2018-11-30 MED ORDER — LACTATED RINGERS IV SOLN
INTRAVENOUS | Status: DC
  Administered 2018-11-30: 13:00:00 via INTRAVENOUS

## 2018-11-30 MED ORDER — DEXMEDETOMIDINE HCL IN NACL 400 MCG/100ML IV SOLN
0.0000 ug/kg/h | INTRAVENOUS | Status: DC
  Administered 2018-11-30: 0.7 ug/kg/h via INTRAVENOUS
  Filled 2018-11-30: qty 100

## 2018-11-30 MED ORDER — METOPROLOL TARTRATE 5 MG/5ML IV SOLN: 2.5000 mg | INTRAVENOUS | Status: DC | PRN

## 2018-11-30 MED ORDER — LIDOCAINE 2% (20 MG/ML) 5 ML SYRINGE
INTRAMUSCULAR | Status: DC | PRN
  Administered 2018-11-30: 100 mg via INTRAVENOUS

## 2018-11-30 MED ORDER — MIDAZOLAM HCL (PF) 10 MG/2ML IJ SOLN
INTRAMUSCULAR | Status: AC
  Filled 2018-11-30: qty 2

## 2018-11-30 MED ORDER — AMIODARONE HCL IN DEXTROSE 360-4.14 MG/200ML-% IV SOLN
30.0000 mg/h | INTRAVENOUS | Status: DC
  Administered 2018-11-30 – 2018-12-01 (×4): 30 mg/h via INTRAVENOUS
  Filled 2018-11-30 (×3): qty 200

## 2018-11-30 MED ORDER — AMIODARONE IV BOLUS ONLY 150 MG/100ML: INTRAVENOUS | Status: DC | PRN

## 2018-11-30 MED ORDER — ACETAMINOPHEN 160 MG/5ML PO SOLN: 650.0000 mg | Freq: Once | ORAL | Status: AC

## 2018-11-30 MED ORDER — LIDOCAINE 2% (20 MG/ML) 5 ML SYRINGE
INTRAMUSCULAR | Status: AC
  Filled 2018-11-30: qty 5

## 2018-11-30 MED ORDER — MAGNESIUM SULFATE 4 GM/100ML IV SOLN
4.0000 g | Freq: Once | INTRAVENOUS | Status: AC
  Administered 2018-11-30: 4 g via INTRAVENOUS
  Filled 2018-11-30: qty 100

## 2018-11-30 MED ORDER — BUPIVACAINE HCL (PF) 0.5 % IJ SOLN
INTRAMUSCULAR | Status: AC
  Filled 2018-11-30: qty 30

## 2018-11-30 MED ORDER — METOPROLOL TARTRATE 12.5 MG HALF TABLET: 12.5000 mg | ORAL_TABLET | Freq: Two times a day (BID) | ORAL | Status: DC

## 2018-11-30 MED ORDER — EPHEDRINE 5 MG/ML INJ
INTRAVENOUS | Status: AC
  Filled 2018-11-30: qty 10

## 2018-11-30 MED ORDER — BUPIVACAINE HCL 0.5 % IJ SOLN
INTRAMUSCULAR | Status: DC | PRN
  Administered 2018-11-30: 30 mL

## 2018-11-30 MED ORDER — SODIUM CHLORIDE 0.9% FLUSH: 3.0000 mL | INTRAVENOUS | Status: DC | PRN

## 2018-11-30 MED ORDER — MIDAZOLAM HCL 2 MG/2ML IJ SOLN: 2.0000 mg | INTRAMUSCULAR | Status: DC | PRN

## 2018-11-30 MED ORDER — NON FORMULARY
Status: DC | PRN
  Administered 2018-11-30: 30 mL via INTRAMUSCULAR

## 2018-11-30 MED ORDER — AMIODARONE HCL IN DEXTROSE 360-4.14 MG/200ML-% IV SOLN
60.0000 mg/h | INTRAVENOUS | Status: DC
  Administered 2018-11-30 (×2): 60 mg/h via INTRAVENOUS
  Filled 2018-11-30: qty 200

## 2018-11-30 MED ORDER — METOPROLOL TARTRATE 25 MG/10 ML ORAL SUSPENSION: 12.5000 mg | Freq: Two times a day (BID) | ORAL | Status: DC

## 2018-11-30 MED ORDER — SODIUM CHLORIDE 0.9 % IV SOLN: INTRAVENOUS | Status: DC

## 2018-11-30 MED ORDER — MORPHINE SULFATE (PF) 2 MG/ML IV SOLN
1.0000 mg | INTRAVENOUS | Status: DC | PRN
  Administered 2018-11-30: 2 mg via INTRAVENOUS
  Administered 2018-11-30: 4 mg via INTRAVENOUS
  Administered 2018-12-02: 2 mg via INTRAVENOUS
  Filled 2018-11-30 (×2): qty 1

## 2018-11-30 MED ORDER — SODIUM CHLORIDE 0.9 % IV SOLN
1.5000 g | Freq: Two times a day (BID) | INTRAVENOUS | Status: AC
  Administered 2018-11-30 – 2018-12-02 (×4): 1.5 g via INTRAVENOUS
  Filled 2018-11-30 (×5): qty 1.5

## 2018-11-30 MED ORDER — NOREPINEPHRINE 4 MG/250ML-% IV SOLN
0.0000 ug/min | INTRAVENOUS | Status: DC
  Filled 2018-11-30: qty 250

## 2018-11-30 MED ORDER — PROPOFOL 10 MG/ML IV BOLUS
INTRAVENOUS | Status: DC | PRN
  Administered 2018-11-30: 90 mg via INTRAVENOUS

## 2018-11-30 MED ORDER — LACTATED RINGERS IV SOLN
INTRAVENOUS | Status: DC
  Administered 2018-11-30: 15:00:00 via INTRAVENOUS

## 2018-11-30 MED ORDER — TRAMADOL HCL 50 MG PO TABS
50.0000 mg | ORAL_TABLET | ORAL | Status: DC | PRN
  Administered 2018-11-30: 50 mg via ORAL
  Filled 2018-11-30: qty 1

## 2018-11-30 MED ORDER — KETOROLAC TROMETHAMINE 30 MG/ML IJ SOLN
30.0000 mg | Freq: Once | INTRAMUSCULAR | Status: AC
  Administered 2018-11-30: 30 mg via INTRAVENOUS
  Filled 2018-11-30: qty 1

## 2018-11-30 MED ORDER — SODIUM CHLORIDE 0.9 % IV SOLN
250.0000 mL | INTRAVENOUS | Status: DC
  Administered 2018-12-01: 250 mL via INTRAVENOUS

## 2018-11-30 MED ORDER — SODIUM CHLORIDE 0.45 % IV SOLN
INTRAVENOUS | Status: DC | PRN
  Administered 2018-11-30: 13:00:00 via INTRAVENOUS

## 2018-11-30 MED ORDER — LACTATED RINGERS IV SOLN
INTRAVENOUS | Status: DC | PRN
  Administered 2018-11-30: 07:00:00 via INTRAVENOUS

## 2018-11-30 MED ORDER — VANCOMYCIN HCL 1000 MG IV SOLR
INTRAVENOUS | Status: DC | PRN
  Administered 2018-11-30 (×3): 1000 mg

## 2018-11-30 MED ORDER — PROPOFOL 10 MG/ML IV BOLUS
INTRAVENOUS | Status: AC
  Filled 2018-11-30: qty 20

## 2018-11-30 MED ORDER — INSULIN REGULAR BOLUS VIA INFUSION
0.0000 [IU] | Freq: Three times a day (TID) | INTRAVENOUS | Status: DC
  Filled 2018-11-30: qty 10

## 2018-11-30 MED ORDER — PHENYLEPHRINE HCL (PRESSORS) 10 MG/ML IV SOLN
INTRAVENOUS | Status: AC
  Filled 2018-11-30: qty 2

## 2018-11-30 MED ORDER — SODIUM CHLORIDE 0.9% FLUSH
3.0000 mL | Freq: Two times a day (BID) | INTRAVENOUS | Status: DC
  Administered 2018-12-01 – 2018-12-02 (×3): 3 mL via INTRAVENOUS

## 2018-11-30 MED ORDER — INSULIN REGULAR(HUMAN) IN NACL 100-0.9 UT/100ML-% IV SOLN
INTRAVENOUS | Status: DC
  Administered 2018-11-30: 3.7 [IU]/h via INTRAVENOUS

## 2018-11-30 MED ORDER — FAMOTIDINE IN NACL 20-0.9 MG/50ML-% IV SOLN
20.0000 mg | Freq: Two times a day (BID) | INTRAVENOUS | Status: AC
  Administered 2018-11-30: 20 mg via INTRAVENOUS

## 2018-11-30 MED ORDER — PHENYLEPHRINE 40 MCG/ML (10ML) SYRINGE FOR IV PUSH (FOR BLOOD PRESSURE SUPPORT)
PREFILLED_SYRINGE | INTRAVENOUS | Status: AC
  Filled 2018-11-30: qty 10

## 2018-11-30 MED ORDER — AMIODARONE HCL IN DEXTROSE 360-4.14 MG/200ML-% IV SOLN
30.0000 mg/h | INTRAVENOUS | Status: DC
  Filled 2018-11-30: qty 200

## 2018-11-30 MED ORDER — ALBUMIN HUMAN 5 % IV SOLN
250.0000 mL | INTRAVENOUS | Status: AC | PRN
  Administered 2018-11-30: 12.5 g via INTRAVENOUS

## 2018-11-30 MED ORDER — HEPARIN SODIUM (PORCINE) 1000 UNIT/ML IJ SOLN
INTRAMUSCULAR | Status: DC | PRN
  Administered 2018-11-30: 10000 [IU] via INTRAVENOUS
  Administered 2018-11-30: 24000 [IU] via INTRAVENOUS

## 2018-11-30 MED ORDER — MIDAZOLAM HCL 5 MG/5ML IJ SOLN
INTRAMUSCULAR | Status: DC | PRN
  Administered 2018-11-30: 1 mg via INTRAVENOUS
  Administered 2018-11-30 (×3): 2 mg via INTRAVENOUS
  Administered 2018-11-30: 3 mg via INTRAVENOUS

## 2018-11-30 MED ORDER — FENTANYL CITRATE (PF) 250 MCG/5ML IJ SOLN
INTRAMUSCULAR | Status: DC | PRN
  Administered 2018-11-30 (×2): 250 ug via INTRAVENOUS
  Administered 2018-11-30 (×2): 100 ug via INTRAVENOUS
  Administered 2018-11-30 (×2): 250 ug via INTRAVENOUS
  Administered 2018-11-30: 50 ug via INTRAVENOUS

## 2018-11-30 MED ORDER — PHENYLEPHRINE HCL-NACL 20-0.9 MG/250ML-% IV SOLN: 0.0000 ug/min | INTRAVENOUS | Status: DC

## 2018-11-30 MED ORDER — DEXMEDETOMIDINE HCL IN NACL 400 MCG/100ML IV SOLN
INTRAVENOUS | Status: DC | PRN
  Administered 2018-11-30: .5 ug/kg/h via INTRAVENOUS

## 2018-11-30 MED ORDER — AMIODARONE HCL IN DEXTROSE 360-4.14 MG/200ML-% IV SOLN
60.0000 mg/h | INTRAVENOUS | Status: DC
  Administered 2018-11-30: 60 mg/h via INTRAVENOUS
  Filled 2018-11-30: qty 200

## 2018-11-30 MED ORDER — BISACODYL 10 MG RE SUPP
10.0000 mg | Freq: Every day | RECTAL | Status: DC
  Administered 2018-12-04: 10 mg via RECTAL

## 2018-11-30 MED ORDER — ALBUMIN HUMAN 5 % IV SOLN
INTRAVENOUS | Status: DC | PRN
  Administered 2018-11-30 (×3): via INTRAVENOUS

## 2018-11-30 MED ORDER — BUPIVACAINE LIPOSOME 1.3 % IJ SUSP
INTRAMUSCULAR | Status: DC | PRN
  Administered 2018-11-30: 20 mL

## 2018-11-30 MED ORDER — VANCOMYCIN HCL 1000 MG IV SOLR
INTRAVENOUS | Status: DC | PRN
  Administered 2018-11-30: 1000 mL

## 2018-11-30 MED ORDER — AMIODARONE LOAD VIA INFUSION
150.0000 mg | Freq: Once | INTRAVENOUS | Status: AC
  Administered 2018-11-30: 150 mg via INTRAVENOUS
  Filled 2018-11-30: qty 83.34

## 2018-11-30 MED ORDER — CHLORHEXIDINE GLUCONATE CLOTH 2 % EX PADS
6.0000 | MEDICATED_PAD | Freq: Every day | CUTANEOUS | Status: DC
  Administered 2018-11-30 – 2018-12-02 (×4): 6 via TOPICAL

## 2018-11-30 MED ORDER — STERILE WATER FOR INJECTION IJ SOLN
INTRAMUSCULAR | Status: AC
  Filled 2018-11-30: qty 10

## 2018-11-30 MED ORDER — VANCOMYCIN HCL IN DEXTROSE 1-5 GM/200ML-% IV SOLN
1000.0000 mg | Freq: Once | INTRAVENOUS | Status: AC
  Administered 2018-11-30: 1000 mg via INTRAVENOUS
  Filled 2018-11-30: qty 200

## 2018-11-30 MED ORDER — ASPIRIN 81 MG PO CHEW: 324.0000 mg | CHEWABLE_TABLET | Freq: Every day | ORAL | Status: DC

## 2018-11-30 MED ORDER — VANCOMYCIN HCL 1000 MG IV SOLR
INTRAVENOUS | Status: AC
  Filled 2018-11-30: qty 3000

## 2018-11-30 MED ORDER — TRANEXAMIC ACID 1000 MG/10ML IV SOLN
INTRAVENOUS | Status: DC | PRN
  Administered 2018-11-30: 1.5 mg/kg/h via INTRAVENOUS

## 2018-11-30 MED ORDER — ACETAMINOPHEN 160 MG/5ML PO SOLN: 1000.0000 mg | Freq: Four times a day (QID) | ORAL | Status: DC

## 2018-11-30 MED ORDER — HEMOSTATIC AGENTS (NO CHARGE) OPTIME
TOPICAL | Status: DC | PRN
  Administered 2018-11-30 (×3): 1 via TOPICAL

## 2018-11-30 MED ORDER — LACTATED RINGERS IV SOLN
INTRAVENOUS | Status: DC | PRN
  Administered 2018-11-30 (×2): via INTRAVENOUS

## 2018-11-30 MED ORDER — SODIUM CHLORIDE (PF) 0.9 % IJ SOLN
OROMUCOSAL | Status: DC | PRN
  Administered 2018-11-30 (×3): 4 mL via TOPICAL

## 2018-11-30 MED ORDER — PLASMA-LYTE 148 IV SOLN
INTRAVENOUS | Status: DC | PRN
  Administered 2018-11-30: 500 mL via INTRAVASCULAR

## 2018-11-30 MED ORDER — ONDANSETRON HCL 4 MG/2ML IJ SOLN
4.0000 mg | Freq: Four times a day (QID) | INTRAMUSCULAR | Status: DC | PRN
  Administered 2018-12-01: 4 mg via INTRAVENOUS
  Filled 2018-11-30: qty 2

## 2018-11-30 MED ORDER — ROCURONIUM BROMIDE 10 MG/ML (PF) SYRINGE
PREFILLED_SYRINGE | INTRAVENOUS | Status: AC
  Filled 2018-11-30: qty 10

## 2018-11-30 MED ORDER — CHLORHEXIDINE GLUCONATE 0.12 % MT SOLN
15.0000 mL | OROMUCOSAL | Status: AC
  Administered 2018-11-30: 15 mL via OROMUCOSAL

## 2018-11-30 MED ORDER — PROTAMINE SULFATE 10 MG/ML IV SOLN
INTRAVENOUS | Status: DC | PRN
  Administered 2018-11-30 (×2): 50 mg via INTRAVENOUS
  Administered 2018-11-30: 70 mg via INTRAVENOUS
  Administered 2018-11-30: 40 mg via INTRAVENOUS
  Administered 2018-11-30 (×2): 50 mg via INTRAVENOUS
  Administered 2018-11-30: 30 mg via INTRAVENOUS

## 2018-11-30 MED ORDER — ASPIRIN EC 325 MG PO TBEC: 325.0000 mg | DELAYED_RELEASE_TABLET | Freq: Every day | ORAL | Status: DC

## 2018-11-30 MED ORDER — PROTAMINE SULFATE 10 MG/ML IV SOLN
INTRAVENOUS | Status: AC
  Filled 2018-11-30: qty 25

## 2018-11-30 SURGICAL SUPPLY — 100 items
ADAPTER CARDIO PERF ANTE/RETRO (ADAPTER) ×3 IMPLANT
APPLICATOR TIP COSEAL (VASCULAR PRODUCTS) ×1 IMPLANT
BAG DECANTER FOR FLEXI CONT (MISCELLANEOUS) ×3 IMPLANT
BASKET HEART (ORDER IN 25'S) (MISCELLANEOUS) ×1
BASKET HEART (ORDER IN 25S) (MISCELLANEOUS) ×2 IMPLANT
BATTERY MAXDRIVER (MISCELLANEOUS) ×1 IMPLANT
BLADE CLIPPER SURG (BLADE) ×3 IMPLANT
BLADE STERNUM SYSTEM 6 (BLADE) ×3 IMPLANT
BLADE SURG 11 STRL SS (BLADE) ×1 IMPLANT
BNDG ELASTIC 4X5.8 VLCR STR LF (GAUZE/BANDAGES/DRESSINGS) ×3 IMPLANT
BNDG ELASTIC 6X10 VLCR STRL LF (GAUZE/BANDAGES/DRESSINGS) ×1 IMPLANT
BNDG ELASTIC 6X5.8 VLCR STR LF (GAUZE/BANDAGES/DRESSINGS) ×3 IMPLANT
BNDG GAUZE ELAST 4 BULKY (GAUZE/BANDAGES/DRESSINGS) ×3 IMPLANT
CANISTER SUCT 3000ML PPV (MISCELLANEOUS) ×3 IMPLANT
CANNULA NON VENT 20FR 12 (CANNULA) ×1 IMPLANT
CATH CPB KIT HENDRICKSON (MISCELLANEOUS) ×3 IMPLANT
CATH ROBINSON RED A/P 18FR (CATHETERS) ×6 IMPLANT
CLIP RETRACTION 3.0MM CORONARY (MISCELLANEOUS) ×3 IMPLANT
CONN ST 1/4X3/8  BEN (MISCELLANEOUS) ×1
CONN ST 1/4X3/8 BEN (MISCELLANEOUS) IMPLANT
CONT SPEC 4OZ CLIKSEAL STRL BL (MISCELLANEOUS) ×1 IMPLANT
COVER WAND RF STERILE (DRAPES) ×3 IMPLANT
DERMABOND ADHESIVE PROPEN (GAUZE/BANDAGES/DRESSINGS) ×2
DERMABOND ADVANCED (GAUZE/BANDAGES/DRESSINGS) ×1
DERMABOND ADVANCED .7 DNX12 (GAUZE/BANDAGES/DRESSINGS) ×2 IMPLANT
DERMABOND ADVANCED .7 DNX6 (GAUZE/BANDAGES/DRESSINGS) IMPLANT
DRAIN CHANNEL 28F RND 3/8 FF (WOUND CARE) ×9 IMPLANT
DRAPE CARDIOVASCULAR INCISE (DRAPES) ×1
DRAPE SLUSH/WARMER DISC (DRAPES) ×3 IMPLANT
DRAPE SRG 135X102X78XABS (DRAPES) ×2 IMPLANT
DRSG AQUACEL AG ADV 3.5X14 (GAUZE/BANDAGES/DRESSINGS) ×3 IMPLANT
ELECT CAUTERY BLADE 6.4 (BLADE) ×3 IMPLANT
ELECT REM PT RETURN 9FT ADLT (ELECTROSURGICAL) ×6
ELECTRODE REM PT RTRN 9FT ADLT (ELECTROSURGICAL) ×4 IMPLANT
FELT TEFLON 1X6 (MISCELLANEOUS) ×5 IMPLANT
GAUZE SPONGE 4X4 12PLY STRL (GAUZE/BANDAGES/DRESSINGS) ×6 IMPLANT
GLOVE BIO SURGEON STRL SZ 6 (GLOVE) ×5 IMPLANT
GLOVE BIO SURGEON STRL SZ 6.5 (GLOVE) ×3 IMPLANT
GLOVE NEODERM STRL 7.5 LF PF (GLOVE) ×6 IMPLANT
GLOVE SURG NEODERM 7.5  LF PF (GLOVE) ×3
GOWN STRL REUS W/ TWL LRG LVL3 (GOWN DISPOSABLE) ×8 IMPLANT
GOWN STRL REUS W/TWL LRG LVL3 (GOWN DISPOSABLE) ×8
HEMOSTAT POWDER SURGIFOAM 1G (HEMOSTASIS) ×9 IMPLANT
HEMOSTAT SURGICEL 2X14 (HEMOSTASIS) ×3 IMPLANT
INSERT FOGARTY XLG (MISCELLANEOUS) ×1 IMPLANT
KIT BASIN OR (CUSTOM PROCEDURE TRAY) ×3 IMPLANT
KIT SUCTION CATH 14FR (SUCTIONS) ×3 IMPLANT
KIT TURNOVER KIT B (KITS) ×3 IMPLANT
KIT VASOVIEW HEMOPRO 2 VH 4000 (KITS) ×3 IMPLANT
LEAD PACING MYOCARDI (MISCELLANEOUS) ×3 IMPLANT
MARKER GRAFT CORONARY BYPASS (MISCELLANEOUS) ×9 IMPLANT
NDL 18GX1X1/2 (RX/OR ONLY) (NEEDLE) ×2 IMPLANT
NEEDLE 18GX1X1/2 (RX/OR ONLY) (NEEDLE) ×3 IMPLANT
NS IRRIG 1000ML POUR BTL (IV SOLUTION) ×15 IMPLANT
PACK E OPEN HEART (SUTURE) ×3 IMPLANT
PACK OPEN HEART (CUSTOM PROCEDURE TRAY) ×3 IMPLANT
PACK SPY-PHI (KITS) ×1 IMPLANT
PAD ARMBOARD 7.5X6 YLW CONV (MISCELLANEOUS) ×6 IMPLANT
PAD ELECT DEFIB RADIOL ZOLL (MISCELLANEOUS) ×3 IMPLANT
PENCIL BUTTON HOLSTER BLD 10FT (ELECTRODE) ×3 IMPLANT
PLATE STERNAL 2.3X208 14H 2-PK (Plate) ×1 IMPLANT
POSITIONER HEAD DONUT 9IN (MISCELLANEOUS) ×3 IMPLANT
PUNCH AORTIC ROTATE  4.5MM 8IN (MISCELLANEOUS) ×1 IMPLANT
SCREW LOCKING TI 2.3X11MM (Screw) ×4 IMPLANT
SCREW LOCKING TI 2.3X13MM (Screw) ×2 IMPLANT
SCREW STERNAL LOCK 2.3MM (Screw) ×6 IMPLANT
SET CARDIOPLEGIA MPS 5001102 (MISCELLANEOUS) ×1 IMPLANT
SUT BONE WAX W31G (SUTURE) ×3 IMPLANT
SUT MNCRL AB 3-0 PS2 18 (SUTURE) ×6 IMPLANT
SUT MNCRL AB 4-0 PS2 18 (SUTURE) ×1 IMPLANT
SUT PDS AB 1 CTX 36 (SUTURE) ×6 IMPLANT
SUT PROLENE 3 0 SH DA (SUTURE) ×3 IMPLANT
SUT PROLENE 5 0 C 1 36 (SUTURE) IMPLANT
SUT PROLENE 6 0 C 1 30 (SUTURE) ×9 IMPLANT
SUT PROLENE 8 0 BV175 6 (SUTURE) ×1 IMPLANT
SUT PROLENE BLUE 7 0 (SUTURE) ×3 IMPLANT
SUT SILK  1 MH (SUTURE) ×1
SUT SILK 1 MH (SUTURE) IMPLANT
SUT SILK 2 0 SH CR/8 (SUTURE) IMPLANT
SUT SILK 3 0 SH CR/8 (SUTURE) IMPLANT
SUT STEEL 6MS V (SUTURE) ×3 IMPLANT
SUT STEEL SZ 6 DBL 3X14 BALL (SUTURE) ×3 IMPLANT
SUT VIC AB 2-0 CT1 27 (SUTURE) ×1
SUT VIC AB 2-0 CT1 TAPERPNT 27 (SUTURE) IMPLANT
SUT VIC AB 2-0 CTX 27 (SUTURE) IMPLANT
SUT VIC AB 3-0 X1 27 (SUTURE) IMPLANT
SYR 10ML LL (SYRINGE) ×1 IMPLANT
SYR 30ML LL (SYRINGE) ×3 IMPLANT
SYR 3ML LL SCALE MARK (SYRINGE) ×3 IMPLANT
SYS ATRICLIP LAA EXCLUSION 35 (Clip) ×1 IMPLANT
SYSTEM SAHARA CHEST DRAIN ATS (WOUND CARE) ×4 IMPLANT
TAPE CLOTH SURG 4X10 WHT LF (GAUZE/BANDAGES/DRESSINGS) ×1 IMPLANT
TAPE PAPER 2X10 WHT MICROPORE (GAUZE/BANDAGES/DRESSINGS) ×1 IMPLANT
TOWEL GREEN STERILE (TOWEL DISPOSABLE) ×3 IMPLANT
TOWEL GREEN STERILE FF (TOWEL DISPOSABLE) ×3 IMPLANT
TRAY FOLEY SLVR 16FR TEMP STAT (SET/KITS/TRAYS/PACK) ×3 IMPLANT
TUBING LAP HI FLOW INSUFFLATIO (TUBING) ×3 IMPLANT
UNDERPAD 30X30 (UNDERPADS AND DIAPERS) ×3 IMPLANT
WATER STERILE IRR 1000ML POUR (IV SOLUTION) ×6 IMPLANT
WATER STERILE IRR 1000ML UROMA (IV SOLUTION) IMPLANT

## 2018-11-30 NOTE — Procedures (Signed)
Extubation Procedure Note  Patient Details:   Name: Curtis Weiss DOB: 1945/02/24 MRN: [id number]   Airway Documentation:    Vent end date: 11/30/18 Vent end time: 1640   Evaluation  O2 sats: stable throughout Complications: No apparent complications Patient did tolerate procedure well. Bilateral Breath Sounds: Clear, Diminished   Yes   Leak test positive. No stridor noted. Pt did >-20 on NIF and > 86mL/kg on VC  Shelton Soler 11/30/2018, 4:45 PM

## 2018-11-30 NOTE — Progress Notes (Signed)
Patient ID: Curtis Weiss, male   DOB: [birth year], 73 y.o.   MRN: [id number]  TCTS Evening Rounds:   Hemodynamically stable  CI = 3.2  Extubated  Urine output good  CT output low  CBC    Component Value Date/Time   WBC 9.2 11/30/2018 1305   RBC 3.33 (L) 11/30/2018 1305   HGB 10.9 (L) 11/30/2018 1632   HCT 32.0 (L) 11/30/2018 1632   PLT 75 (L) 11/30/2018 1305   MCV 103.3 (H) 11/30/2018 1305   MCH 35.1 (H) 11/30/2018 1305   MCHC 34.0 11/30/2018 1305   RDW 12.2 11/30/2018 1305   LYMPHSABS 2.9 11/25/2018 0001   MONOABS 0.9 11/25/2018 0001   EOSABS 0.3 11/25/2018 0001   BASOSABS 0.1 11/25/2018 0001     BMET    Component Value Date/Time   NA 142 11/30/2018 1632   NA 144 10/20/2018 1519   K 4.1 11/30/2018 1632   CL 105 11/30/2018 1145   CO2 22 11/30/2018 0410   GLUCOSE 176 (H) 11/30/2018 1145   BUN 11 11/30/2018 1145   BUN 13 10/20/2018 1519   CREATININE 0.80 11/30/2018 1145   CALCIUM 9.1 11/30/2018 0410   GFRNONAA >60 11/30/2018 0410   GFRAA >60 11/30/2018 0410     A/P:  Stable postop course. Continue current plans

## 2018-11-30 NOTE — Anesthesia Postprocedure Evaluation (Signed)
Anesthesia Post Note  Patient: Curtis Weiss  Procedure(s) Performed: CORONARY ARTERY BYPASS GRAFTING (CABG) x three, using bilateral mammary arteries and right leg greater saphenous vein harvested endoscopically (N/A Chest) TRANSESOPHAGEAL ECHOCARDIOGRAM (TEE) (N/A ) Clipping Of Atrial Appendage - using AtriCure clip size 41mm (N/A Chest)     Patient location during evaluation: SICU Anesthesia Type: General Level of consciousness: awake Pain management: pain level controlled Vital Signs Assessment: post-procedure vital signs reviewed and stable Respiratory status: spontaneous breathing, nonlabored ventilation, respiratory function stable and patient connected to nasal cannula oxygen Cardiovascular status: blood pressure returned to baseline and stable Postop Assessment: no apparent nausea or vomiting Anesthetic complications: no    Last Vitals:  Vitals:   11/30/18 1700 11/30/18 1729  BP: 95/62   Pulse: 80 80  Resp: (!) 27 (!) 21  Temp:    SpO2: 99% 100%    Last Pain:  Vitals:   11/30/18 1729  TempSrc:   PainSc: 8                  Airabella Barley P Harleen Fineberg

## 2018-11-30 NOTE — Anesthesia Procedure Notes (Signed)
Central Venous Catheter Insertion Performed by: Myrtie Soman, MD, anesthesiologist Start/End10/27/2020 6:20 AM, 11/30/2018 6:48 AM Patient location: Pre-op. Preanesthetic checklist: patient identified, IV checked, site marked, risks and benefits discussed, surgical consent, monitors and equipment checked, pre-op evaluation, timeout performed and anesthesia consent Position: Trendelenburg Lidocaine 1% used for infiltration and patient sedated Hand hygiene performed  and maximum sterile barriers used  Catheter size: 8.5 Fr PA cath was placed.Sheath introducer PA Cath depth:45 Procedure performed using ultrasound guided technique. Ultrasound Notes:anatomy identified, needle tip was noted to be adjacent to the nerve/plexus identified, no ultrasound evidence of intravascular and/or intraneural injection and image(s) printed for medical record Attempts: 1 Following insertion, line sutured, dressing applied and Biopatch. Post procedure assessment: blood return through all ports, free fluid flow and no air  Patient tolerated the procedure well with no immediate complications.

## 2018-11-30 NOTE — H&P (Signed)
History and Physical Interval Note:  11/30/2018 7:44 AM  Curtis Weiss  has presented today for surgery, with the diagnosis of Coronary Artery Disease.  The various methods of treatment have been discussed with the patient and family. After consideration of risks, benefits and other options for treatment, the patient has consented to  Procedure(s): CORONARY ARTERY BYPASS GRAFTING (CABG) (N/A) TRANSESOPHAGEAL ECHOCARDIOGRAM (TEE) (N/A) as a surgical intervention.  The patient's history has been reviewed, patient examined, no change in status, stable for surgery.  I have reviewed the patient's chart and labs.  Questions were answered to the patient's satisfaction.     Wonda Olds

## 2018-11-30 NOTE — Anesthesia Procedure Notes (Signed)
Anesthesia Procedure Image    

## 2018-11-30 NOTE — Brief Op Note (Addendum)
11/24/2018 - 11/30/2018  11:24 AM  PATIENT:  Avanell Shackleton  73 y.o. male  PRE-OPERATIVE DIAGNOSIS:  1. S/p NSTEMI 2. Coronary Artery Disease  POST-OPERATIVE DIAGNOSIS:  1. S/p NSTEMI 2. Coronary Artery Disease  PROCEDURE: TRANSESOPHAGEAL ECHOCARDIOGRAM (TEE), MEDIAN STERNOTOMY for CORONARY ARTERY BYPASS GRAFTING (CABG) x 3 (LIMA to LAD, SVG to DIAGONAL, RIMA to PDA)  using bilateral mammary arteries and right thigh greater saphenous vein harvested endoscopically and clipping Of Atrial Appendage - using AtriCure clip size 51mm   SURGEON:  Surgeon(s) and Role:    Wonda Olds, MD - Primary  PHYSICIAN ASSISTANT: Lars Pinks PA-C  ASSISTANTS: Vernie Murders RNFA  ANESTHESIA:   general  EBL:  Per perfusion and anesthesia record  DRAINS: Chest tubes and Blake drains placed in the mediastinal and pleural spaces   LOCAL MEDICATIONS USED:  Exparel and Marcaine  COUNTS:  YES  DICTATION: .Dragon Dictation  PLAN OF CARE: Admit to inpatient   PATIENT DISPOSITION:  ICU - intubated and hemodynamically stable.   Delay start of Pharmacological VTE agent (>24hrs) due to surgical blood loss or risk of bleeding: yes  BASELINE WEIGHT: 95.1 kg  Add to procedure: rigid sternal reconstruction with linear plating system  Cameryn Schum Z. Orvan Seen, [place]

## 2018-11-30 NOTE — Anesthesia Procedure Notes (Signed)
Procedure Name: Intubation Date/Time: 11/30/2018 7:54 AM Performed by: Danille Oppedisano T, CRNA Pre-anesthesia Checklist: Patient identified, Emergency Drugs available, Suction available and Patient being monitored Patient Re-evaluated:Patient Re-evaluated prior to induction Oxygen Delivery Method: Circle system utilized Preoxygenation: Pre-oxygenation with 100% oxygen Induction Type: IV induction Ventilation: Mask ventilation without difficulty Laryngoscope Size: Mac and 2 Grade View: Grade I Tube type: Oral Tube size: 8.0 mm Number of attempts: 1 Airway Equipment and Method: Stylet Placement Confirmation: ETT inserted through vocal cords under direct vision,  positive ETCO2 and breath sounds checked- equal and bilateral Secured at: 23 cm Tube secured with: Tape Dental Injury: Teeth and Oropharynx as per pre-operative assessment  Comments: DL and intubation by Martinique Ingram SRNA

## 2018-11-30 NOTE — Progress Notes (Signed)
  Amiodarone Drug - Drug Interaction Consult Note  Recommendations: No interactions requiring adjustments at this time. Continue to monitor vital signs and any new medication interactions.   Amiodarone is metabolized by the cytochrome P450 system and therefore has the potential to cause many drug interactions. Amiodarone has an average plasma half-life of 50 days (range 20 to 100 days).   There is potential for drug interactions to occur several weeks or months after stopping treatment and the onset of drug interactions may be slow after initiating amiodarone.   [x]  Statins: Increased risk of myopathy. Simvastatin- restrict dose to 20mg  daily. Other statins: counsel patients to report any muscle pain or weakness immediately.  []  Anticoagulants: Amiodarone can increase anticoagulant effect. Consider warfarin dose reduction. Patients should be monitored closely and the dose of anticoagulant altered accordingly, remembering that amiodarone levels take several weeks to stabilize.  []  Antiepileptics: Amiodarone can increase plasma concentration of phenytoin, the dose should be reduced. Note that small changes in phenytoin dose can result in large changes in levels. Monitor patient and counsel on signs of toxicity.  [x]  Beta blockers: increased risk of bradycardia, AV block and myocardial depression. Sotalol - avoid concomitant use.  []   Calcium channel blockers (diltiazem and verapamil): increased risk of bradycardia, AV block and myocardial depression.  []   Cyclosporine: Amiodarone increases levels of cyclosporine. Reduced dose of cyclosporine is recommended.  []  Digoxin dose should be halved when amiodarone is started.  []  Diuretics: increased risk of cardiotoxicity if hypokalemia occurs.  []  Oral hypoglycemic agents (glyburide, glipizide, glimepiride): increased risk of hypoglycemia. Patient's glucose levels should be monitored closely when initiating amiodarone therapy.   []  Drugs that  prolong the QT interval:  Torsades de pointes risk may be increased with concurrent use - avoid if possible.  Monitor QTc, also keep magnesium/potassium WNL if concurrent therapy can't be avoided. Marland Kitchen Antibiotics: e.g. fluoroquinolones, erythromycin. . Antiarrhythmics: e.g. quinidine, procainamide, disopyramide, sotalol. . Antipsychotics: e.g. phenothiazines, haloperidol.  . Lithium, tricyclic antidepressants, and methadone.  Thank You,  Antonietta Jewel, PharmD, [place] Clinical Pharmacist  Phone: [phone]  Please check AMION for all [place] phone numbers After 10:00 PM, call [place] [phone] 11/30/2018 1:09 PM

## 2018-11-30 NOTE — Progress Notes (Signed)
Patient going to CABG Will sign off of care and defer mgmt to CVTS  Sherolyn Buba, MD [facility] 11:58 AM

## 2018-11-30 NOTE — Transfer of Care (Signed)
Immediate Anesthesia Transfer of Care Note  Patient: Curtis Weiss  Procedure(s) Performed: CORONARY ARTERY BYPASS GRAFTING (CABG) x three, using bilateral mammary arteries and right leg greater saphenous vein harvested endoscopically (N/A Chest) TRANSESOPHAGEAL ECHOCARDIOGRAM (TEE) (N/A ) Clipping Of Atrial Appendage - using AtriCure clip size 44mm (N/A Chest)  Patient Location: ICU  Anesthesia Type:General  Level of Consciousness: Patient remains intubated per anesthesia plan  Airway & Oxygen Therapy: Patient remains intubated per anesthesia plan and Patient placed on Ventilator (see vital sign flow sheet for setting)  Post-op Assessment: Report given to RN and Post -op Vital signs reviewed and stable  Post vital signs: Reviewed and stable  Last Vitals:  Vitals Value Taken Time  BP 142/63 11/30/18 1306  Temp 35.4 C 11/30/18 1317  Pulse 80 11/30/18 1317  Resp 14 11/30/18 1317  SpO2 99 % 11/30/18 1317  Vitals shown include unvalidated device data.  Last Pain:  Vitals:   11/30/18 0458  TempSrc: Oral  PainSc:       Patients Stated Pain Goal: 0 ([id number])  Complications: No apparent anesthesia complications

## 2018-11-30 NOTE — Progress Notes (Signed)
Patient was picked up this morning for his CABG.  Called in report to [phone].

## 2018-12-01 ENCOUNTER — Inpatient Hospital Stay (HOSPITAL_COMMUNITY): Payer: Medicare HMO

## 2018-12-01 LAB — CBC
HCT: 34.8 % — ABNORMAL LOW (ref 39.0–52.0)
HCT: 34.3 % — ABNORMAL LOW (ref 39.0–52.0)
Hemoglobin: 12 g/dL — ABNORMAL LOW (ref 13.0–17.0)
Hemoglobin: 11.8 g/dL — ABNORMAL LOW (ref 13.0–17.0)
MCH: 35.6 pg — ABNORMAL HIGH (ref 26.0–34.0)
MCH: 35.4 pg — ABNORMAL HIGH (ref 26.0–34.0)
MCHC: 34.5 g/dL (ref 30.0–36.0)
MCHC: 34.4 g/dL (ref 30.0–36.0)
MCV: 103.3 fL — ABNORMAL HIGH (ref 80.0–100.0)
MCV: 103 fL — ABNORMAL HIGH (ref 80.0–100.0)
Platelets: 86 10*3/uL — ABNORMAL LOW (ref 150–400)
Platelets: 94 10*3/uL — ABNORMAL LOW (ref 150–400)
RBC: 3.37 MIL/uL — ABNORMAL LOW (ref 4.22–5.81)
RBC: 3.33 MIL/uL — ABNORMAL LOW (ref 4.22–5.81)
RDW: 12.5 % (ref 11.5–15.5)
RDW: 12.6 % (ref 11.5–15.5)
WBC: 11.1 10*3/uL — ABNORMAL HIGH (ref 4.0–10.5)
WBC: 12.4 10*3/uL — ABNORMAL HIGH (ref 4.0–10.5)
nRBC: 0 % (ref 0.0–0.2)
nRBC: 0 % (ref 0.0–0.2)

## 2018-12-01 LAB — BASIC METABOLIC PANEL
Anion gap: 7 (ref 5–15)
Anion gap: 7 (ref 5–15)
BUN: 10 mg/dL (ref 8–23)
BUN: 11 mg/dL (ref 8–23)
CO2: 21 mmol/L — ABNORMAL LOW (ref 22–32)
CO2: 23 mmol/L (ref 22–32)
Calcium: 8.1 mg/dL — ABNORMAL LOW (ref 8.9–10.3)
Calcium: 8.2 mg/dL — ABNORMAL LOW (ref 8.9–10.3)
Chloride: 110 mmol/L (ref 98–111)
Chloride: 106 mmol/L (ref 98–111)
Creatinine, Ser: 1.03 mg/dL (ref 0.61–1.24)
Creatinine, Ser: 1.04 mg/dL (ref 0.61–1.24)
GFR calc Af Amer: >60 mL/min (ref >60)
GFR calc Af Amer: >60 mL/min (ref >60)
GFR calc non Af Amer: >60 mL/min (ref >60)
GFR calc non Af Amer: >60 mL/min (ref >60)
Glucose, Bld: 120 mg/dL — ABNORMAL HIGH (ref 70–99)
Glucose, Bld: 152 mg/dL — ABNORMAL HIGH (ref 70–99)
Potassium: 4.1 mmol/L (ref 3.5–5.1)
Potassium: 3.8 mmol/L (ref 3.5–5.1)
Sodium: 138 mmol/L (ref 135–145)
Sodium: 136 mmol/L (ref 135–145)

## 2018-12-01 LAB — MAGNESIUM
Magnesium: 2.5 mg/dL — ABNORMAL HIGH (ref 1.7–2.4)
Magnesium: 2.5 mg/dL — ABNORMAL HIGH (ref 1.7–2.4)

## 2018-12-01 LAB — TSH: TSH: 1.839 u[IU]/mL (ref 0.350–4.500)

## 2018-12-01 LAB — GLUCOSE, CAPILLARY
Glucose-Capillary: 102 mg/dL — ABNORMAL HIGH (ref 70–99)
Glucose-Capillary: 102 mg/dL — ABNORMAL HIGH (ref 70–99)
Glucose-Capillary: 105 mg/dL — ABNORMAL HIGH (ref 70–99)
Glucose-Capillary: 116 mg/dL — ABNORMAL HIGH (ref 70–99)
Glucose-Capillary: 131 mg/dL — ABNORMAL HIGH (ref 70–99)
Glucose-Capillary: 141 mg/dL — ABNORMAL HIGH (ref 70–99)
Glucose-Capillary: 147 mg/dL — ABNORMAL HIGH (ref 70–99)
Glucose-Capillary: 85 mg/dL (ref 70–99)

## 2018-12-01 MED ORDER — KETOROLAC TROMETHAMINE 15 MG/ML IJ SOLN
15.0000 mg | Freq: Four times a day (QID) | INTRAMUSCULAR | Status: AC
  Administered 2018-12-01 – 2018-12-02 (×5): 15 mg via INTRAVENOUS
  Filled 2018-12-01 (×5): qty 1

## 2018-12-01 MED ORDER — COLCHICINE 0.6 MG PO TABS
0.6000 mg | ORAL_TABLET | Freq: Every day | ORAL | Status: DC
  Administered 2018-12-01 – 2018-12-04 (×4): 0.6 mg via ORAL
  Filled 2018-12-01 (×4): qty 1

## 2018-12-01 MED ORDER — METOPROLOL TARTRATE 12.5 MG HALF TABLET
12.5000 mg | ORAL_TABLET | Freq: Three times a day (TID) | ORAL | Status: DC
  Administered 2018-12-01 – 2018-12-03 (×5): 12.5 mg via ORAL
  Filled 2018-12-01 (×8): qty 1

## 2018-12-01 MED ORDER — ASPIRIN EC 81 MG PO TBEC
81.0000 mg | DELAYED_RELEASE_TABLET | Freq: Every day | ORAL | Status: DC
  Administered 2018-12-01 – 2018-12-04 (×4): 81 mg via ORAL
  Filled 2018-12-01 (×4): qty 1

## 2018-12-01 MED ORDER — CLOPIDOGREL BISULFATE 75 MG PO TABS
75.0000 mg | ORAL_TABLET | Freq: Every day | ORAL | Status: DC
  Administered 2018-12-01 – 2018-12-04 (×4): 75 mg via ORAL
  Filled 2018-12-01 (×4): qty 1

## 2018-12-01 MED ORDER — DIAZEPAM 2 MG PO TABS
2.0000 mg | ORAL_TABLET | Freq: Three times a day (TID) | ORAL | Status: DC
  Administered 2018-12-01 – 2018-12-02 (×5): 2 mg via ORAL
  Filled 2018-12-01 (×5): qty 1

## 2018-12-01 MED ORDER — INSULIN ASPART 100 UNIT/ML ~~LOC~~ SOLN
0.0000 [IU] | SUBCUTANEOUS | Status: DC
  Administered 2018-12-01 – 2018-12-02 (×4): 2 [IU] via SUBCUTANEOUS
  Administered 2018-12-02: 4 [IU] via SUBCUTANEOUS

## 2018-12-01 MED ORDER — METOPROLOL TARTRATE 25 MG/10 ML ORAL SUSPENSION
12.5000 mg | Freq: Three times a day (TID) | ORAL | Status: DC
  Filled 2018-12-01 (×6): qty 5

## 2018-12-01 NOTE — Discharge Instructions (Signed)

## 2018-12-01 NOTE — Discharge Summary (Signed)
Physician Discharge Summary       [address]       [place] [address]             [phone]    Patient ID: Curtis Weiss MRN: [id number] DOB/AGE: 1945-12-29 73 y.o.  Admit date: 11/24/2018 Discharge date: 12/10/2018  Admission Diagnoses: 1. Unstable angina ([place]) 2. CAD (coronary artery disease)  Discharge Diagnoses:  1. S/P CABG x 3 2. Expected ABL anemia 3. History of   AAA (abdominal aortic aneurysm) without rupture ([place]) 4. History of Hypertension 5. History of Pure hypercholesterolemia 6. History of chronic insomnia 7. History of idiopathic peripheral neuropathy 8. History of tobacco abuse    Consults: None  Procedure (s):   Coronary Artery Bypass Grafting x 3              Left Internal Mammary Artery to Distal Left Anterior Descending Coronary Artery; Right IMA to PDA; Sapheonous Vein Graft to Diagonal Branch Coronary Artery; Endoscopic Vein Harvest from right Thigh; application of atrial appendage clip (35 mm) completion graft surveillance with Indocyanine green fluorescence imaging (SPY); rigid sternal reconstruction with linear plating system by Dr. Orvan Seen on 11/05/2018.  History of Presenting Illness: Curtis Weiss is a 73 year old gentleman without significant past medical history who has sought evaluation for crescendo angina which has been worsening since the previous month.  This ultimately led to an noted where he felt extreme pain and presented to the emergency department where he had a mild troponin elevation.  The patient underwent left heart catheterization demonstrating complicated anatomy of LAD and diagonal disease as well as right coronary artery disease.  Due to the nature of the anatomy, patient is obtaining cardiothoracic surgery for consideration of CABG.  He has been chest pain-free since admitted.  His cardiac history is otherwise notable for essential hypertension, hyperlipidemia, and bradycardia. Dr. Orvan Seen discussed the need for  coronary artery bypass grafting surgery. Potential risks, benefits, and complications of the surgery were discussed with the patient and he agreed to proceed with surgery. Pre operative carotid duplex US showed no significant internal carotid artery stenosis bilaterally. He underwent a CABG x 3 on 11/30/2018.  Brief Hospital Course:  The patient was extubated the evening of surgery without difficulty. He remained afebrile and hemodynamically stable. He was on an Amiodarone drip post op. [name], a line, chest tubes, and foley were removed early in the post operative course. Lopressor was started and titrated accordingly. He was volume over loaded and diuresed. He had ABL anemia. He did not require a post op transfusion. Last H and H was 10/30 He was weaned off the insulin drip.  The patient's glucose remained well controlled.The patient's HGA1C pre op was 5.2. The patient was felt surgically stable for transfer from the ICU to [facility] for further convalescence on 10/29 . He continues to progress with cardiac rehab. He was ambulating on room air. He has been tolerating a diet and has had a bowel movement. Epicardial pacing wires were removed on 10/30 . Chest tube sutures will be removed in the office after discharge. The patient is felt surgically stable for discharge today.   Latest Vital Signs: Blood pressure (!) 152/72, pulse 60, temperature (!) 97 F (36.1 C), temperature source Oral, resp. rate 19, height 6' (1.829 m), weight 98.5 kg, SpO2 99 %.  Physical Exam: General appearance: alert, cooperative and no distress Heart: regular rate and rhythm Lungs: clear to auscultation bilaterally Abdomen: benign Extremities: no edema Wound:  incis healing well Discharge Condition: Stable and discharged to home.  Recent laboratory studies:  Lab Results  Component Value Date   WBC 13.1 (H) 12/03/2018   HGB 10.2 (L) 12/03/2018   HCT 30.0 (L) 12/03/2018   MCV 103.4 (H) 12/03/2018   PLT 112 (L)  12/03/2018   Lab Results  Component Value Date   NA 140 12/03/2018   K 3.8 12/03/2018   CL 108 12/03/2018   CO2 25 12/03/2018   CREATININE 1.20 12/03/2018   GLUCOSE 101 (H) 12/03/2018      Diagnostic Studies: Dg Chest 2 View  Result Date: 12/10/2018 CLINICAL DATA:  CABG follow-up EXAM: CHEST - 2 VIEW COMPARISON:  12/03/2018 FINDINGS: Normal heart size. Median sternotomy with CABG and left atrial clipping. Resolved atelectasis. Trace pleural effusions remain. IMPRESSION: Near complete resolution of postoperative changes. Only trace pleural fluid remains. Electronically Signed   By: Monte Fantasia M.D.   On: 12/10/2018 09:36   Dg Chest 2 View  Result Date: 12/03/2018 CLINICAL DATA:  CABG. EXAM: CHEST - 2 VIEW COMPARISON:  12/02/2018 FINDINGS: Median sternotomy wires unchanged. Lungs are somewhat hypoinflated with subtle bibasilar density likely atelectasis. No significant effusion. No evidence of right apical pneumothorax. Mild stable cardiomegaly. Remainder of the exam is unchanged. IMPRESSION: Subtle bibasilar atelectasis. No right apical pneumothorax visualized. Mild stable cardiomegaly. Electronically Signed   By: Marin Olp M.D.   On: 12/03/2018 08:03   Dg Chest Port 1 View  Result Date: 12/02/2018 CLINICAL DATA:  Chest tube EXAM: PORTABLE CHEST 1 VIEW COMPARISON:  yesterday FINDINGS: Cardiomegaly. CABG and left atrial clipping. Chest tubes in place. Right IJ sheath in place. Stable atelectasis. Possible trace right apical pneumothorax. IMPRESSION: Stable postoperative chest with low volumes and atelectasis. Possible trace right apical pneumothorax. Electronically Signed   By: Monte Fantasia M.D.   On: 12/02/2018 06:53   Dg Chest Port 1 View  Result Date: 12/01/2018 CLINICAL DATA:  Status post coronary bypass graft. EXAM: PORTABLE CHEST 1 VIEW COMPARISON:  November 30, 2018. FINDINGS: Stable cardiomegaly. Endotracheal and nasogastric tubes have been removed. Right internal  jugular catheter has been removed. Right internal jugular venous sheath remains. Bilateral chest tubes are noted without pneumothorax. Minimal bibasilar subsegmental atelectasis is noted. Bony thorax is unremarkable. IMPRESSION: Endotracheal and nasogastric tubes have been removed. Stable bilateral chest tubes are noted without pneumothorax. Minimal bibasilar subsegmental atelectasis. Electronically Signed   By: Marijo Conception M.D.   On: 12/01/2018 07:08   Dg Chest Port 1 View  Result Date: 11/30/2018 CLINICAL DATA:  Post CABG EXAM: PORTABLE CHEST 1 VIEW COMPARISON:  11/25/2018 FINDINGS: Changes of CABG. Bilateral chest tubes in place. No pneumothorax. Endotracheal tube is 3.3 cm above the carina. Swan-Ganz catheter tip in the main pulmonary artery. NG tube enters the stomach. Mild vascular congestion and areas of perihilar and lower lobe atelectasis. No effusions. IMPRESSION: Postoperative changes from CABG. No visible pneumothorax. Support devices in expected position. Mild vascular congestion and areas of atelectasis. Electronically Signed   By: Rolm Baptise M.D.   On: 11/30/2018 13:32   Dg Chest Port 1 View  Result Date: 11/25/2018 CLINICAL DATA:  Left chest pain EXAM: PORTABLE CHEST 1 VIEW COMPARISON:  None. FINDINGS: The heart size and mediastinal contours are within normal limits. Both lungs are clear. The visualized skeletal structures are unremarkable. IMPRESSION: No active disease. Electronically Signed   By: Monte Fantasia M.D.   On: 11/25/2018 04:14   Vas US Doppler Pre Cabg  Result  Date: 11/28/2018 PREOPERATIVE VASCULAR EVALUATION  Indications:      Pre-surgical evaluation. Risk Factors:     Hypertension, hyperlipidemia. Comparison Study: No prior study. Performing Technologist: Maudry Mayhew MHA, RDMS, RVT, RDCS  Examination Guidelines: A complete evaluation includes B-mode imaging, spectral Doppler, color Doppler, and power Doppler as needed of all accessible portions of each  vessel. Bilateral testing is considered an integral part of a complete examination. Limited examinations for reoccurring indications may be performed as noted.  Right Carotid Findings: +----------+--------+--------+--------+--------------------------+--------+           PSV cm/sEDV cm/sStenosisDescribe                  Comments +----------+--------+--------+--------+--------------------------+--------+ CCA Prox  81      12                                                 +----------+--------+--------+--------+--------------------------+--------+ CCA Distal93      12              smooth and heterogenous            +----------+--------+--------+--------+--------------------------+--------+ ICA Prox  81      15              smooth and heterogenous            +----------+--------+--------+--------+--------------------------+--------+ ICA Distal80      16                                                 +----------+--------+--------+--------+--------------------------+--------+ ECA       125                     irregular and heterogenous         +----------+--------+--------+--------+--------------------------+--------+ Portions of this table do not appear on this page. +----------+--------+-------+----------------+------------+           PSV cm/sEDV cmsDescribe        Arm Pressure +----------+--------+-------+----------------+------------+ Subclavian169            Multiphasic, [id number]          +----------+--------+-------+----------------+------------+ +---------+--------+--+--------+-+---------+ VertebralPSV cm/s30EDV cm/s5Antegrade +---------+--------+--+--------+-+---------+ Left Carotid Findings: +----------+--------+--------+--------+--------+--------+           PSV cm/sEDV cm/sStenosisDescribeComments +----------+--------+--------+--------+--------+--------+ CCA Prox  158     14                                +----------+--------+--------+--------+--------+--------+ CCA Distal100     14                               +----------+--------+--------+--------+--------+--------+ ICA Prox  67      12                               +----------+--------+--------+--------+--------+--------+ ICA Distal86      20                               +----------+--------+--------+--------+--------+--------+ ECA       93  6                                +----------+--------+--------+--------+--------+--------+ +----------+--------+--------+--------+------------+ SubclavianPSV cm/sEDV cm/sDescribeArm Pressure +----------+--------+--------+--------+------------+           125                     138          +----------+--------+--------+--------+------------+ +---------+--------+--+--------+--+ VertebralPSV cm/s66EDV cm/s12 +---------+--------+--+--------+--+  ABI Findings: +--------+------------------+-----+---------+--------+ Right   Rt Pressure (mmHg)IndexWaveform Comment  +--------+------------------+-----+---------+--------+ [id number]                    triphasic         +--------+------------------+-----+---------+--------+ PTA                            triphasic         +--------+------------------+-----+---------+--------+ DP                             triphasic         +--------+------------------+-----+---------+--------+ +--------+------------------+-----+---------+-------+ Left    Lt Pressure (mmHg)IndexWaveform Comment +--------+------------------+-----+---------+-------+ [id number]                    triphasic        +--------+------------------+-----+---------+-------+ PTA                            triphasic        +--------+------------------+-----+---------+-------+ DP                             triphasic        +--------+------------------+-----+---------+-------+  Right Doppler Findings:  +-----------+--------+-----+---------+--------------------+ Site       PressureIndexDoppler  Comments             +-----------+--------+-----+---------+--------------------+ Brachial   147          triphasic                     +-----------+--------+-----+---------+--------------------+ Radial                  triphasic                     +-----------+--------+-----+---------+--------------------+ Ulnar                   triphasic                     +-----------+--------+-----+---------+--------------------+ Palmar Arch                      Within normal limits +-----------+--------+-----+---------+--------------------+  Left Doppler Findings: +-----------+--------+-----+---------+-----------------------------------------+ Site       PressureIndexDoppler  Comments                                  +-----------+--------+-----+---------+-----------------------------------------+ Brachial   138          triphasic                                          +-----------+--------+-----+---------+-----------------------------------------+ Radial  triphasic                                          +-----------+--------+-----+---------+-----------------------------------------+ Ulnar                   triphasic                                          +-----------+--------+-----+---------+-----------------------------------------+ Palmar Arch                      Signal is unaffected with radial                                           compression, obliterates with ulnar                                        compression.                              +-----------+--------+-----+---------+-----------------------------------------+  Summary: Right Carotid: Velocities in the right ICA are consistent with a 1-39% stenosis. Left Carotid: Velocities in the left ICA are consistent with a 1-39% stenosis. Vertebrals:  Bilateral vertebral arteries  demonstrate antegrade flow. Subclavians: Normal flow hemodynamics were seen in bilateral subclavian              arteries. Right Upper Extremity: No significant arterial obstruction detected in the right upper extremity. Left Upper Extremity: No significant arterial obstruction detected in the left upper extremity.  Lower Extremities: Bilateral pedal waveforms are within normal limits. Electronically signed by Monica Martinez MD on 11/28/2018 at 5:04:32 PM.    Final        Discharge Instructions    Amb Referral to Cardiac Rehabilitation   Complete by: As directed    Will send CRP II referral to [facility]   Diagnosis: CABG   CABG X ___: 3   After initial evaluation and assessments completed: Virtual Based Care may be provided alone or in conjunction with Phase 2 Cardiac Rehab based on patient barriers.: Yes   Discharge patient   Complete by: As directed    Discharge disposition: 01-Home or Self Care   Discharge patient date: 12/04/2018      Discharge Medications: Allergies as of 12/04/2018   No Known Allergies     Medication List    STOP taking these medications   amLODipine 5 MG tablet Commonly known as: NORVASC   doxazosin 4 MG tablet Commonly known as: CARDURA   metoprolol tartrate 100 MG tablet Commonly known as: LOPRESSOR   quinapril 40 MG tablet Commonly known as: ACCUPRIL     TAKE these medications   amiodarone 200 MG tablet Commonly known as: PACERONE Take 1 tablet (200 mg total) by mouth daily.   aspirin EC 81 MG tablet Take 81 mg by mouth daily.   atorvastatin 80 MG tablet Commonly known as: LIPITOR Take 1 tablet (80 mg total) by mouth daily at 6 PM.   clopidogrel 75 MG tablet Commonly known as: PLAVIX Take 1  tablet (75 mg total) by mouth daily.   lisinopril 20 MG tablet Commonly known as: ZESTRIL Take 1 tablet (20 mg total) by mouth daily.   MULTIVITAMIN PO Take 1 tablet by mouth daily.   traMADol 50 MG tablet Commonly known as: ULTRAM  Take 1 tablet (50 mg total) by mouth every 6 (six) hours as needed for moderate pain.      The patient has been discharged on:   1.Beta Blocker:  Yes [  x ]                              No   [   ]                              If No, reason:  2.Ace Inhibitor/ARB: Yes [ y  ]                                     No  [    ]                                     If No, reason:  3.Statin:   Yes [ x  ]                  No  [   ]                  If No, reason:  4.Ecasa:  Yes  [ x  ]                  No   [   ]                  If No, reason: Follow Up Appointments: Follow-up Information    Lelon Perla, MD. Go on 01/24/2019.   Specialty: Cardiology Why: Appointment time is at 9:40 am Contact information: [address] [place] [address] [phone]        Wonda Olds, MD. Go on 12/10/2018.   Specialty: Cardiothoracic Surgery Why: Appointment is at 4:00 pm Contact information: [place] [address] [phone]           Signed: Gaspar Bidding 12/10/2018, 2:22 PM

## 2018-12-01 NOTE — Op Note (Addendum)
CARDIOTHORACIC SURGERY OPERATIVE NOTE  Date of Procedure: 12/01/2018  Preoperative Diagnosis: Severe 2-vessel Coronary Artery Disease with complicated LAD anatomy  Postoperative Diagnosis: Same with atrial flutter  Procedure:    Coronary Artery Bypass Grafting x 3   Left Internal Mammary Artery to Distal Left Anterior Descending Coronary Artery; Right IMA to PDA; Sapheonous Vein Graft to Diagonal Branch Coronary Artery; Endoscopic Vein Harvest from right Thigh; application of atrial appendage clip (35 mm) completion graft surveillance with Indocyanine green fluorescence imaging (SPY); rigid sternal reconstruction with linear plating system Surgeon: B. Murvin Natal, MD  Assistant: Josie Saunders, PA-C  Anesthesia: get  Operative Findings:  preserved left ventricular systolic function  good quality internal mammary artery conduits  good quality saphenous vein conduit  good quality target vessels for grafting    BRIEF CLINICAL NOTE AND INDICATIONS FOR SURGERY  73 yo man w/ progressive angina for past few weeks underwent LHC last week showing severe 2-vessel CAD of LAD and RCA. The LAD lesion impinged upon the large 1st diagonal branch and was bulky in nature, prompting referral for CABG. He presents for the procedure.    DETAILS OF THE OPERATIVE PROCEDURE  Preparation:  The patient is brought to the operating room on the above mentioned date and central monitoring was established by the anesthesia team including placement of Swan-Ganz catheter and radial arterial line. The patient is placed in the supine position on the operating table.  Intravenous antibiotics are administered. General endotracheal anesthesia is induced uneventfully. A Foley catheter is placed.  Baseline transesophageal echocardiogram was performed.  Findings were notable for preserved LV function and mild MR.   The patient's chest, abdomen, both groins, and both lower extremities are prepared and draped in a  sterile manner. A time out procedure is performed.   Surgical Approach and Conduit Harvest:  A median sternotomy incision was performed and the left internal mammary artery is dissected from the chest wall and prepared for bypass grafting. The left internal mammary artery is notably good quality conduit. Simultaneously, the greater saphenous vein is obtained from the patient's right thigh using endoscopic vein harvest technique. The saphenous vein is notably good quality conduit. After removal of the saphenous vein, the small surgical incisions in the lower extremity are closed with absorbable suture. Following systemic heparinization, the left internal mammary artery was transected distally noted to have excellent flow. Attention is turned to the right chest where the RIMA is mobilized in a similar fashion and also noted to have good flow distally. Both IMA conduits are treated with papaverine.    Extracorporeal Cardiopulmonary Bypass and Myocardial Protection:  The pericardium is opened. The ascending aorta is nondiseased  in appearance. The ascending aorta and the right atrium are cannulated for cardiopulmonary bypass.  Adequate heparinization is verified.    The entire pre-bypass portion of the operation was notable for stable hemodynamics, but the patient was noted to be in atrial flutter with a ventricular rate of approximately 45 bpm.  Cardiopulmonary bypass was begun and the surface of the heart is inspected. Distal target vessels are selected for coronary artery bypass grafting. A cardioplegia cannula is placed in the ascending aorta.   The patient is allowed to cool passively to 34C systemic temperature.  The aortic cross clamp is applied and cold blood cardioplegia is delivered initially in an antegrade fashion through the aortic root.  Iced saline slush is applied for topical hypothermia.  The initial cardioplegic arrest is rapid with early diastolic arrest.  Repeat doses  of cardioplegia  are administered intermittently throughout the entire cross clamp portion of the operation through the aortic root and through subsequently placed vein grafts in order to maintain completely flat electrocardiogram.  Myocardial protection was felt to be excellent.   Coronary Artery Bypass Grafting:   The diagonal branch of the left anterior descending coronary artery was grafted using a reversed saphenous vein graft in an end-to-side fashion.  At the site of distal anastomosis the target vessel was good quality and measured approximately 2  mm in diameter.Anastomotic patency and runoff was confirmed with indocyanine green fluorescence imaging (SPY).  The distal left anterior coronary artery was grafted with the left internal mammary artery in an end-to-side fashion.  At the site of distal anastomosis the target vessel was good quality and measured approximately 2 mm in diameter.Anastomotic patency and runoff was confirmed with indocyanine green fluorescence imaging (SPY).  The  posterior descending branch of the right coronary artery was grafted using a reversed saphenous vein graft in an end-to-side fashion.  At the site of distal anastomosis the target vessel was good quality and measured approximately 1.5 mm in diameter.Anastomotic patency and runoff was confirmed with indocyanine green fluorescence imaging (SPY).  The proximal vein graft anastomosis were placed directly to the ascending aorta prior to removal of the aortic cross clamp.   The aortic cross clamp was removed after a total cross clamp time of 66 minutes.   Procedure Completion:  All proximal and distal coronary anastomoses were inspected for hemostasis and appropriate graft orientation. Epicardial pacing wires are fixed to the right ventricular outflow tract and to the right atrial appendage. The patient is rewarmed to 37C temperature. The patient is weaned and disconnected from cardiopulmonary bypass.  The patient's rhythm at  separation from bypass was sinus bradycardia.  The patient was weaned from cardiopulmonary bypass  without any inotropic support.  Followup transesophageal echocardiogram performed after separation from bypass revealed  no changes from the preoperative exam.  The aortic and venous cannula were removed uneventfully. Protamine was administered to reverse the anticoagulation. The mediastinum and pleural space were inspected for hemostasis and irrigated with saline solution. The mediastinum and bilateral pleural space were drained using fluted chest tubes placed through separate stab incisions inferiorly.  The soft tissues anterior to the aorta were reapproximated loosely. The sternum is closed with double strength sternal wire. The soft tissues anterior to the sternum were closed in multiple layers and the skin is closed with a running subcuticular skin closure.  The post-bypass portion of the operation was notable for stable rhythm and hemodynamics.  No blood products were administered during the operation.   Disposition:  The patient tolerated the procedure well and is transported to the surgical intensive care in stable condition. There are no intraoperative complications. All sponge instrument and needle counts are verified correct at completion of the operation.    Jayme Cloud, MD 12/01/2018 11:05 AM  Date of service should read 11/30/18

## 2018-12-01 NOTE — Progress Notes (Signed)
1 Day Post-Op Procedure(s) (LRB): CORONARY ARTERY BYPASS GRAFTING (CABG) x three, using bilateral mammary arteries and right leg greater saphenous vein harvested endoscopically (N/A) TRANSESOPHAGEAL ECHOCARDIOGRAM (TEE) (N/A) Clipping Of Atrial Appendage - using AtriCure clip size 43mm (N/A) Subjective: Back pain, bilateral Objective: Vital signs in last 24 hours: Temp:  [95.7 F (35.4 C)-98.7 F (37.1 C)] 98.6 F (37 C) (10/28 0759) Pulse Rate:  [79-80] 80 (10/28 0615) Cardiac Rhythm: Normal sinus rhythm;A-V Sequential paced (10/28 0400) Resp:  [12-27] 22 (10/28 0615) BP: (89-142)/(56-79) 128/63 (10/28 0600) SpO2:  [93 %-100 %] 96 % (10/28 0615) Arterial Line BP: (103-150)/(44-68) 150/49 (10/28 0615) FiO2 (%):  [40 %-50 %] 40 % (10/27 1600) Weight:  [103.1 kg] 103.1 kg (10/28 0600)  Hemodynamic parameters for last 24 hours: CVP:  [6 mmHg-12 mmHg] 12 mmHg  Intake/Output from previous day: 10/27 0701 - 10/28 0700 In: 5265.8 [P.O.:480; I.V.:3664.4; IV Piggyback:1121.4] Out: 2450 [Urine:2070; Chest Tube:380] Intake/Output this shift: No intake/output data recorded.  General appearance: no distress Neurologic: intact Heart: regular rate and rhythm, S1, S2 normal, no murmur, click, rub or gallop Lungs: clear to auscultation bilaterally Extremities: edema mild Wound: dressed, dry  Lab Results: Recent Labs    11/30/18 1900 12/01/18 0513  WBC 10.4 11.1*  HGB 12.4* 12.0*  HCT 35.3* 34.8*  PLT 95* 86*   BMET:  Recent Labs    11/30/18 1900 12/01/18 0513  NA 134* 138  K 4.0 4.1  CL 110 110  CO2 18* 21*  GLUCOSE 134* 120*  BUN 10 10  CREATININE 1.02 1.03  CALCIUM 7.4* 8.1*    PT/INR:  Recent Labs    11/30/18 1305  LABPROT 16.0*  INR 1.3*   ABG    Component Value Date/Time   PHART 7.339 (L) 11/30/2018 1808   HCO3 18.7 (L) 11/30/2018 1808   TCO2 20 (L) 11/30/2018 1808   ACIDBASEDEF 6.0 (H) 11/30/2018 1808   O2SAT 99.0 11/30/2018 1808   CBG (last 3)   Recent Labs    12/01/18 0202 12/01/18 0308 12/01/18 0456  GLUCAP 85 102* 105*    Assessment/Plan: S/P Procedure(s) (LRB): CORONARY ARTERY BYPASS GRAFTING (CABG) x three, using bilateral mammary arteries and right leg greater saphenous vein harvested endoscopically (N/A) TRANSESOPHAGEAL ECHOCARDIOGRAM (TEE) (N/A) Clipping Of Atrial Appendage - using AtriCure clip size 32mm (N/A) Mobilize oob to chair; pain control; leave tubes today   LOS: 5 days    Wonda Olds 12/01/2018

## 2018-12-01 NOTE — Progress Notes (Signed)
Patient ID: Curtis Weiss, male   DOB: [birth year], 73 y.o.   MRN: [id number] EVENING ROUNDS NOTE :     [address]       [place] [address]             [phone]                 1 Day Post-Op Procedure(s) (LRB): CORONARY ARTERY BYPASS GRAFTING (CABG) x three, using bilateral mammary arteries and right leg greater saphenous vein harvested endoscopically (N/A) TRANSESOPHAGEAL ECHOCARDIOGRAM (TEE) (N/A) Clipping Of Atrial Appendage - using AtriCure clip size 42mm (N/A)  Total Length of Stay:  LOS: 5 days  BP (!) 143/66   Pulse 70   Temp 98.6 F (37 C) (Oral)   Resp 18   Ht 6' (1.829 m)   Wt 103.1 kg   SpO2 95%   BMI 30.83 kg/m   .Intake/Output      10/27 0701 - 10/28 0700 10/28 0701 - 10/29 0700   P.O. 480    I.V. (mL/kg) 3664.4 (35.5) 404.2 (3.9)   IV Piggyback 1121.4 100   Total Intake(mL/kg) 5265.8 (51.1) 504.2 (4.9)   Urine (mL/kg/hr) 2070 (0.8) 730 (0.6)   Chest Tube 380 340   Total Output 2450 1070   Net +2815.8 -565.8          . sodium chloride Stopped (11/30/18 1519)  . sodium chloride 20 mL/hr at 12/01/18 1800  . sodium chloride Stopped (12/01/18 0716)  . amiodarone 30 mg/hr (12/01/18 1800)  . cefUROXime (ZINACEF)  IV Stopped (12/01/18 0959)  . lactated ringers    . lactated ringers 20 mL/hr at 12/01/18 0500  . lactated ringers 20 mL/hr at 11/30/18 1445  . nitroGLYCERIN Stopped (11/30/18 1512)     Lab Results  Component Value Date   WBC 12.4 (H) 12/01/2018   HGB 11.8 (L) 12/01/2018   HCT 34.3 (L) 12/01/2018   PLT 94 (L) 12/01/2018   GLUCOSE 152 (H) 12/01/2018   CHOL 213 (H) 11/26/2018   TRIG 254 (H) 11/26/2018   HDL 31 (L) 11/26/2018   LDLCALC 131 (H) 11/26/2018   ALT 23 11/26/2018   AST 21 11/26/2018   NA 136 12/01/2018   K 3.8 12/01/2018   CL 106 12/01/2018   CREATININE 1.04 12/01/2018   BUN 11 12/01/2018   CO2 23 12/01/2018   TSH 1.839 12/01/2018   INR 1.3 (H) 11/30/2018   HGBA1C 5.2 11/25/2018   Stable today     Grace Isaac MD  Beeper [phone] Office [phone] 12/01/2018 6:47 PM

## 2018-12-02 ENCOUNTER — Inpatient Hospital Stay (HOSPITAL_COMMUNITY): Payer: Medicare HMO

## 2018-12-02 ENCOUNTER — Encounter (HOSPITAL_COMMUNITY): Payer: Self-pay | Admitting: Cardiothoracic Surgery

## 2018-12-02 LAB — CBC WITH DIFFERENTIAL/PLATELET
Abs Immature Granulocytes: 0.07 10*3/uL (ref 0.00–0.07)
Basophils Absolute: 0 10*3/uL (ref 0.0–0.1)
Basophils Relative: 0 %
Eosinophils Absolute: 0 10*3/uL (ref 0.0–0.5)
Eosinophils Relative: 0 %
HCT: 32.4 % — ABNORMAL LOW (ref 39.0–52.0)
Hemoglobin: 10.9 g/dL — ABNORMAL LOW (ref 13.0–17.0)
Immature Granulocytes: 1 %
Lymphocytes Relative: 7 %
Lymphs Abs: 0.9 10*3/uL (ref 0.7–4.0)
MCH: 35 pg — ABNORMAL HIGH (ref 26.0–34.0)
MCHC: 33.6 g/dL (ref 30.0–36.0)
MCV: 104.2 fL — ABNORMAL HIGH (ref 80.0–100.0)
Monocytes Absolute: 1.2 10*3/uL — ABNORMAL HIGH (ref 0.1–1.0)
Monocytes Relative: 9 %
Neutro Abs: 10.5 10*3/uL — ABNORMAL HIGH (ref 1.7–7.7)
Neutrophils Relative %: 83 %
Platelets: 95 10*3/uL — ABNORMAL LOW (ref 150–400)
RBC: 3.11 MIL/uL — ABNORMAL LOW (ref 4.22–5.81)
RDW: 12.7 % (ref 11.5–15.5)
WBC: 12.6 10*3/uL — ABNORMAL HIGH (ref 4.0–10.5)
nRBC: 0 % (ref 0.0–0.2)

## 2018-12-02 LAB — BASIC METABOLIC PANEL
Anion gap: 6 (ref 5–15)
BUN: 13 mg/dL (ref 8–23)
CO2: 23 mmol/L (ref 22–32)
Calcium: 8 mg/dL — ABNORMAL LOW (ref 8.9–10.3)
Chloride: 107 mmol/L (ref 98–111)
Creatinine, Ser: 1.03 mg/dL (ref 0.61–1.24)
GFR calc Af Amer: >60 mL/min (ref >60)
GFR calc non Af Amer: >60 mL/min (ref >60)
Glucose, Bld: 119 mg/dL — ABNORMAL HIGH (ref 70–99)
Potassium: 3.8 mmol/L (ref 3.5–5.1)
Sodium: 136 mmol/L (ref 135–145)

## 2018-12-02 LAB — GLUCOSE, CAPILLARY
Glucose-Capillary: 102 mg/dL — ABNORMAL HIGH (ref 70–99)
Glucose-Capillary: 115 mg/dL — ABNORMAL HIGH (ref 70–99)
Glucose-Capillary: 175 mg/dL — ABNORMAL HIGH (ref 70–99)
Glucose-Capillary: 109 mg/dL — ABNORMAL HIGH (ref 70–99)
Glucose-Capillary: 114 mg/dL — ABNORMAL HIGH (ref 70–99)
Glucose-Capillary: 157 mg/dL — ABNORMAL HIGH (ref 70–99)

## 2018-12-02 MED ORDER — FUROSEMIDE 10 MG/ML IJ SOLN
40.0000 mg | Freq: Once | INTRAMUSCULAR | Status: AC
  Administered 2018-12-02: 40 mg via INTRAVENOUS
  Filled 2018-12-02: qty 4

## 2018-12-02 MED ORDER — AMIODARONE HCL 200 MG PO TABS
400.0000 mg | ORAL_TABLET | Freq: Two times a day (BID) | ORAL | Status: DC
  Administered 2018-12-02 (×2): 400 mg via ORAL
  Filled 2018-12-02 (×2): qty 2

## 2018-12-02 MED ORDER — IBUPROFEN 400 MG PO TABS
400.0000 mg | ORAL_TABLET | Freq: Three times a day (TID) | ORAL | Status: DC
  Administered 2018-12-02 – 2018-12-03 (×4): 400 mg via ORAL
  Filled 2018-12-02: qty 1
  Filled 2018-12-02: qty 2
  Filled 2018-12-02: qty 1
  Filled 2018-12-02: qty 2

## 2018-12-02 MED FILL — Lidocaine HCl Local Soln Prefilled Syringe 100 MG/5ML (2%): INTRAMUSCULAR | Qty: 5 | Status: AC

## 2018-12-02 MED FILL — Magnesium Sulfate Inj 50%: INTRAMUSCULAR | Qty: 10 | Status: AC

## 2018-12-02 MED FILL — Mannitol IV Soln 20%: INTRAVENOUS | Qty: 500 | Status: AC

## 2018-12-02 MED FILL — Heparin Sodium (Porcine) Inj 1000 Unit/ML: INTRAMUSCULAR | Qty: 30 | Status: AC

## 2018-12-02 MED FILL — Heparin Sodium (Porcine) Inj 1000 Unit/ML: INTRAMUSCULAR | Qty: 10 | Status: AC

## 2018-12-02 MED FILL — Potassium Chloride Inj 2 mEq/ML: INTRAVENOUS | Qty: 40 | Status: AC

## 2018-12-02 MED FILL — Sodium Chloride IV Soln 0.9%: INTRAVENOUS | Qty: 2000 | Status: AC

## 2018-12-02 MED FILL — Sodium Bicarbonate IV Soln 8.4%: INTRAVENOUS | Qty: 50 | Status: AC

## 2018-12-02 MED FILL — Electrolyte-R (PH 7.4) Solution: INTRAVENOUS | Qty: 3000 | Status: AC

## 2018-12-02 NOTE — Progress Notes (Signed)
Transported to 4E14 via wheelchair.  Wife at bedside.  Ambulated to bed and tolerated well.  Jen,RN at bedside and connected to Telemetry box.  Patient awake, alert and oriented.

## 2018-12-02 NOTE — Progress Notes (Signed)
2 Days Post-Op Procedure(s) (LRB): CORONARY ARTERY BYPASS GRAFTING (CABG) x three, using bilateral mammary arteries and right leg greater saphenous vein harvested endoscopically (N/A) TRANSESOPHAGEAL ECHOCARDIOGRAM (TEE) (N/A) Clipping Of Atrial Appendage - using AtriCure clip size 64mm (N/A) Subjective: Pain is improved  Objective: Vital signs in last 24 hours: Temp:  [97.8 F (36.6 C)-99.4 F (37.4 C)] 97.8 F (36.6 C) (10/29 0809) Pulse Rate:  [69-80] 70 (10/29 0700) Cardiac Rhythm: Atrial paced (10/29 0400) Resp:  [0-29] 17 (10/29 0700) BP: (111-147)/(54-69) 111/69 (10/29 0700) SpO2:  [91 %-96 %] 93 % (10/29 0700) Weight:  [101.7 kg] 101.7 kg (10/29 0525)  Hemodynamic parameters for last 24 hours:    Intake/Output from previous day: 10/28 0701 - 10/29 0700 In: 1050.1 [I.V.:850.1; IV Piggyback:200] Out: 2125 [Urine:1455; Chest Tube:670] Intake/Output this shift: No intake/output data recorded.  General appearance: alert, cooperative and no distress Neurologic: intact Heart: regular rate and rhythm, S1, S2 normal, no murmur, click, rub or gallop Lungs: clear to auscultation bilaterally Abdomen: soft, non-tender; bowel sounds normal; no masses,  no organomegaly Extremities: edema 1+ Wound: dressed, dry  Lab Results: Recent Labs    12/01/18 1643 12/02/18 0401  WBC 12.4* 12.6*  HGB 11.8* 10.9*  HCT 34.3* 32.4*  PLT 94* 95*   BMET:  Recent Labs    12/01/18 1643 12/02/18 0401  NA 136 136  K 3.8 3.8  CL 106 107  CO2 23 23  GLUCOSE 152* 119*  BUN 11 13  CREATININE 1.04 1.03  CALCIUM 8.2* 8.0*    PT/INR:  Recent Labs    11/30/18 1305  LABPROT 16.0*  INR 1.3*   ABG    Component Value Date/Time   PHART 7.339 (L) 11/30/2018 1808   HCO3 18.7 (L) 11/30/2018 1808   TCO2 20 (L) 11/30/2018 1808   ACIDBASEDEF 6.0 (H) 11/30/2018 1808   O2SAT 99.0 11/30/2018 1808   CBG (last 3)  Recent Labs    12/01/18 2325 12/02/18 0356 12/02/18 0811  GLUCAP 102*  115* 175*    Assessment/Plan: S/P Procedure(s) (LRB): CORONARY ARTERY BYPASS GRAFTING (CABG) x three, using bilateral mammary arteries and right leg greater saphenous vein harvested endoscopically (N/A) TRANSESOPHAGEAL ECHOCARDIOGRAM (TEE) (N/A) Clipping Of Atrial Appendage - using AtriCure clip size 34mm (N/A) Mobilize Diuresis Plan for transfer to step-down: see transfer orders   LOS: 6 days    Wonda Olds 12/02/2018

## 2018-12-03 ENCOUNTER — Encounter (HOSPITAL_COMMUNITY): Payer: Self-pay | Admitting: Cardiothoracic Surgery

## 2018-12-03 ENCOUNTER — Inpatient Hospital Stay (HOSPITAL_COMMUNITY): Payer: Medicare HMO

## 2018-12-03 LAB — BASIC METABOLIC PANEL
Anion gap: 7 (ref 5–15)
BUN: 15 mg/dL (ref 8–23)
CO2: 25 mmol/L (ref 22–32)
Calcium: 8.1 mg/dL — ABNORMAL LOW (ref 8.9–10.3)
Chloride: 108 mmol/L (ref 98–111)
Creatinine, Ser: 1.2 mg/dL (ref 0.61–1.24)
GFR calc Af Amer: >60 mL/min (ref >60)
GFR calc non Af Amer: 60 mL/min — ABNORMAL LOW (ref >60)
Glucose, Bld: 101 mg/dL — ABNORMAL HIGH (ref 70–99)
Potassium: 3.8 mmol/L (ref 3.5–5.1)
Sodium: 140 mmol/L (ref 135–145)

## 2018-12-03 LAB — CBC WITH DIFFERENTIAL/PLATELET
Abs Immature Granulocytes: 0.08 10*3/uL — ABNORMAL HIGH (ref 0.00–0.07)
Basophils Absolute: 0 10*3/uL (ref 0.0–0.1)
Basophils Relative: 0 %
Eosinophils Absolute: 0.2 10*3/uL (ref 0.0–0.5)
Eosinophils Relative: 2 %
HCT: 30 % — ABNORMAL LOW (ref 39.0–52.0)
Hemoglobin: 10.2 g/dL — ABNORMAL LOW (ref 13.0–17.0)
Immature Granulocytes: 1 %
Lymphocytes Relative: 8 %
Lymphs Abs: 1 10*3/uL (ref 0.7–4.0)
MCH: 35.2 pg — ABNORMAL HIGH (ref 26.0–34.0)
MCHC: 34 g/dL (ref 30.0–36.0)
MCV: 103.4 fL — ABNORMAL HIGH (ref 80.0–100.0)
Monocytes Absolute: 1.2 10*3/uL — ABNORMAL HIGH (ref 0.1–1.0)
Monocytes Relative: 9 %
Neutro Abs: 10.5 10*3/uL — ABNORMAL HIGH (ref 1.7–7.7)
Neutrophils Relative %: 80 %
Platelets: 112 10*3/uL — ABNORMAL LOW (ref 150–400)
RBC: 2.9 MIL/uL — ABNORMAL LOW (ref 4.22–5.81)
RDW: 12.6 % (ref 11.5–15.5)
WBC: 13.1 10*3/uL — ABNORMAL HIGH (ref 4.0–10.5)
nRBC: 0 % (ref 0.0–0.2)

## 2018-12-03 LAB — GLUCOSE, CAPILLARY
Glucose-Capillary: 108 mg/dL — ABNORMAL HIGH (ref 70–99)
Glucose-Capillary: 99 mg/dL (ref 70–99)

## 2018-12-03 MED ORDER — LISINOPRIL 5 MG PO TABS
5.0000 mg | ORAL_TABLET | Freq: Every day | ORAL | Status: DC
  Administered 2018-12-03 – 2018-12-04 (×2): 5 mg via ORAL
  Filled 2018-12-03 (×2): qty 1

## 2018-12-03 MED ORDER — AMIODARONE HCL 200 MG PO TABS
200.0000 mg | ORAL_TABLET | Freq: Two times a day (BID) | ORAL | Status: DC
  Administered 2018-12-03 (×2): 200 mg via ORAL
  Filled 2018-12-03 (×2): qty 1

## 2018-12-03 MED ORDER — FUROSEMIDE 40 MG PO TABS
40.0000 mg | ORAL_TABLET | Freq: Every day | ORAL | Status: DC
  Administered 2018-12-03 – 2018-12-04 (×2): 40 mg via ORAL
  Filled 2018-12-03: qty 1

## 2018-12-03 MED ORDER — POTASSIUM CHLORIDE CRYS ER 20 MEQ PO TBCR
20.0000 meq | EXTENDED_RELEASE_TABLET | Freq: Every day | ORAL | Status: DC
  Administered 2018-12-04: 20 meq via ORAL
  Filled 2018-12-03: qty 1

## 2018-12-03 MED ORDER — POTASSIUM CHLORIDE CRYS ER 20 MEQ PO TBCR
40.0000 meq | EXTENDED_RELEASE_TABLET | Freq: Once | ORAL | Status: AC
  Administered 2018-12-03: 40 meq via ORAL
  Filled 2018-12-03: qty 2

## 2018-12-03 NOTE — Progress Notes (Addendum)
      [address]       [place] [address]             [phone]        3 Days Post-Op Procedure(s) (LRB): CORONARY ARTERY BYPASS GRAFTING (CABG) x three, using bilateral mammary arteries and right leg greater saphenous vein harvested endoscopically (N/A) TRANSESOPHAGEAL ECHOCARDIOGRAM (TEE) (N/A) Clipping Of Atrial Appendage - using AtriCure clip size 75mm (N/A)  Subjective: Patient not sleeping much. He feels bloated this am. He has had a bowel movement.  Objective: Vital signs in last 24 hours: Temp:  [97.8 F (36.6 C)-98.7 F (37.1 C)] 98.4 F (36.9 C) (10/30 0405) Pulse Rate:  [53-70] 53 (10/30 0546) Cardiac Rhythm: Normal sinus rhythm;Sinus bradycardia (10/30 0410) Resp:  [17-24] 20 (10/30 0405) BP: (111-145)/(54-72) 139/66 (10/30 0405) SpO2:  [92 %-95 %] 93 % (10/30 0405) Weight:  [99.8 kg] 99.8 kg (10/30 0409)  Pre op weight 95.1 kg Current Weight  12/03/18 99.8 kg   Intake/Output from previous day: 10/29 0701 - 10/30 0700 In: 209.8 [I.V.:109.8; IV Piggyback:100] Out: 2605 [Urine:2595; Chest Tube:10]   Physical Exam:  Cardiovascular: RRR Pulmonary: Slightly diminished bibasilar breath sounds Abdomen: Soft, non tender, bowel sounds present. Extremities: Mild bilateral lower extremity edema. Wounds: Aquacel removed and sternal wound is clean and dry.  No erythema or signs of infection. RLE dressing removed and wound is clean and dry.  Lab Results: CBC: Recent Labs    12/02/18 0401 12/03/18 0249  WBC 12.6* 13.1*  HGB 10.9* 10.2*  HCT 32.4* 30.0*  PLT 95* 112*   BMET:  Recent Labs    12/02/18 0401 12/03/18 0249  NA 136 140  K 3.8 3.8  CL 107 108  CO2 23 25  GLUCOSE 119* 101*  BUN 13 15  CREATININE 1.03 1.20  CALCIUM 8.0* 8.1*    PT/INR:  Lab Results  Component Value Date   INR 1.3 (H) 11/30/2018   INR 1.1 11/25/2018   ABG:  INR: Will add last result for INR, ABG once components are confirmed Will add last 4 CBG  results once components are confirmed  Assessment/Plan:  1. CV - S/p NSTEMI. SB at times with HR in the 50-60's, first degree heart block. On Amiodarone 400 mg bid, Lopressor 12.5 mg bid, Plavix 75 mg daily. Will decrease Amiodarone. Will restart low dose Quinapril for better BP control. 2.  Pulmonary - On room air. PA/LAT CXR ordered. Encourage incentive spirometer 3. Volume Overload - Will give Lasix 40 mg daily 4.  Acute blood loss anemia - H and H this am stable at 10.2 and 30 5. Supplement potassium 6. Mild thrombocytopenia-platelets up to 112,000 7. CBGs 157/108/99. Pre op HGA1C 5.2. Will stop accu checks and SS PRN 8. Will remove EPW 9. Likely home in 1-2 days  Curtis Whalley M ZimmermanPA-C 12/03/2018,6:50 AM

## 2018-12-03 NOTE — Progress Notes (Signed)
Epicardial pacing wires were removed without difficulty. Patient instructed to remain in bed until 10:15 am. Vital signs stable.

## 2018-12-03 NOTE — Progress Notes (Signed)
CARDIAC REHAB PHASE I   Offered to walk with pt. Pt states he is having a rough day. Pt was able to have a BM which made him feel better. Pt states willingness to walk around 4. Encouraged pt to ambulate with RN. Left ed materials at bedside. Pt denies needs for DME.   [id number] Rufina Falco, RN BSN 12/03/2018 2:02 PM

## 2018-12-03 NOTE — Care Management Important Message (Signed)
Important Message  Patient Details  Name: JIONNI SANDERS MRN: [id number] Date of Birth: [birth year]   Medicare Important Message Given:  Yes     Shelda Altes 12/03/2018, 1:00 PM

## 2018-12-04 MED ORDER — AMIODARONE HCL 200 MG PO TABS
200.0000 mg | ORAL_TABLET | Freq: Every day | ORAL | Status: DC
  Administered 2018-12-04: 200 mg via ORAL
  Filled 2018-12-04: qty 1

## 2018-12-04 MED ORDER — CLOPIDOGREL BISULFATE 75 MG PO TABS: 75.0000 mg | ORAL_TABLET | Freq: Every day | ORAL | 1 refills | Status: DC

## 2018-12-04 MED ORDER — ATORVASTATIN CALCIUM 80 MG PO TABS: 80.0000 mg | ORAL_TABLET | Freq: Every day | ORAL | 1 refills | Status: DC

## 2018-12-04 MED ORDER — LISINOPRIL 20 MG PO TABS: 20.0000 mg | ORAL_TABLET | Freq: Every day | ORAL | 1 refills | Status: DC

## 2018-12-04 MED ORDER — TRAMADOL HCL 50 MG PO TABS: 50.0000 mg | ORAL_TABLET | Freq: Four times a day (QID) | ORAL | 0 refills | Status: DC | PRN

## 2018-12-04 MED ORDER — AMIODARONE HCL 200 MG PO TABS: 200.0000 mg | ORAL_TABLET | Freq: Every day | ORAL | 1 refills | Status: DC

## 2018-12-04 NOTE — Progress Notes (Signed)
[address]       [place] [address]             [phone]      4 Days Post-Op Procedure(s) (LRB): CORONARY ARTERY BYPASS GRAFTING (CABG) x three, using bilateral mammary arteries and right leg greater saphenous vein harvested endoscopically (N/A) TRANSESOPHAGEAL ECHOCARDIOGRAM (TEE) (N/A) Clipping Of Atrial Appendage - using AtriCure clip size 17mm (N/A) Subjective: Looks and feels well  Objective: Vital signs in last 24 hours: Temp:  [98 F (36.7 C)-98.9 F (37.2 C)] 98 F (36.7 C) (10/31 0542) Pulse Rate:  [57-62] 60 (10/31 0542) Cardiac Rhythm: Normal sinus rhythm (10/31 0542) Resp:  [19-25] 19 (10/31 0542) BP: (129-165)/(62-70) 151/70 (10/31 0542) SpO2:  [94 %-95 %] 94 % (10/31 0542) Weight:  [98.5 kg] 98.5 kg (10/31 0542)  Hemodynamic parameters for last 24 hours:    Intake/Output from previous day: 10/30 0701 - 10/31 0700 In: 240.3 [P.O.:240; I.V.:0.3] Out: -  Intake/Output this shift: No intake/output data recorded.  General appearance: alert, cooperative and no distress Heart: regular rate and rhythm Lungs: clear to auscultation bilaterally Abdomen: benign Extremities: no edema Wound: incis healing well  Lab Results: Recent Labs    12/02/18 0401 12/03/18 0249  WBC 12.6* 13.1*  HGB 10.9* 10.2*  HCT 32.4* 30.0*  PLT 95* 112*   BMET:  Recent Labs    12/02/18 0401 12/03/18 0249  NA 136 140  K 3.8 3.8  CL 107 108  CO2 23 25  GLUCOSE 119* 101*  BUN 13 15  CREATININE 1.03 1.20  CALCIUM 8.0* 8.1*    PT/INR: No results for input(s): LABPROT, INR in the last 72 hours. ABG    Component Value Date/Time   PHART 7.339 (L) 11/30/2018 1808   HCO3 18.7 (L) 11/30/2018 1808   TCO2 20 (L) 11/30/2018 1808   ACIDBASEDEF 6.0 (H) 11/30/2018 1808   O2SAT 99.0 11/30/2018 1808   CBG (last 3)  Recent Labs    12/02/18 2003 12/03/18 0028 12/03/18 0445  GLUCAP 157* 108* 99    Meds Scheduled Meds: . acetaminophen  1,000 mg  Oral Q6H   Or  . acetaminophen (TYLENOL) oral liquid 160 mg/5 mL  1,000 mg Per Tube Q6H  . amiodarone  200 mg Oral BID  . aspirin EC  81 mg Oral Daily  . atorvastatin  80 mg Oral q1800  . bisacodyl  10 mg Oral Daily   Or  . bisacodyl  10 mg Rectal Daily  . Chlorhexidine Gluconate Cloth  6 each Topical Daily  . clopidogrel  75 mg Oral Daily  . colchicine  0.6 mg Oral Daily  . docusate sodium  200 mg Oral Daily  . folic acid  1 mg Oral Daily  . furosemide  40 mg Oral Daily  . lisinopril  5 mg Oral Daily  . metoprolol tartrate  12.5 mg Oral Q8H   Or  . metoprolol tartrate  12.5 mg Per Tube Q8H  . multivitamin with minerals  1 tablet Oral Daily  . pantoprazole  40 mg Oral Daily  . potassium chloride  20 mEq Oral Daily  . thiamine  100 mg Oral Daily   Or  . thiamine  100 mg Intravenous Daily   Continuous Infusions: PRN Meds:.metoprolol tartrate, ondansetron (ZOFRAN) IV, oxyCODONE, traMADol  Xrays Dg Chest 2 View  Result Date: 12/03/2018 CLINICAL DATA:  CABG. EXAM: CHEST - 2 VIEW COMPARISON:  12/02/2018 FINDINGS: Median sternotomy wires unchanged. Lungs  are somewhat hypoinflated with subtle bibasilar density likely atelectasis. No significant effusion. No evidence of right apical pneumothorax. Mild stable cardiomegaly. Remainder of the exam is unchanged. IMPRESSION: Subtle bibasilar atelectasis. No right apical pneumothorax visualized. Mild stable cardiomegaly. Electronically Signed   By: Marin Olp M.D.   On: 12/03/2018 08:03    Assessment/Plan: S/P Procedure(s) (LRB): CORONARY ARTERY BYPASS GRAFTING (CABG) x three, using bilateral mammary arteries and right leg greater saphenous vein harvested endoscopically (N/A) TRANSESOPHAGEAL ECHOCARDIOGRAM (TEE) (N/A) Clipping Of Atrial Appendage - using AtriCure clip size 29mm (N/A)  1 doing well 2 hemodyn stable with sinus rhythm, sinus brady, some HTN readings- will increase zestril to 20 mg. On amio 200 BID- not sure why, will  reduce to daily. Will stop beta blocker with HR frequently in 40's 3 no new labs 4 appears stable for discharge   LOS: 8 days    [facility] 12/04/2018 Pager [phone] - not for patient use

## 2018-12-04 NOTE — Progress Notes (Signed)
Patient and wife educated on discharge papers. Questions answered. Taken in wheelchair to [employer] parking for pick up.

## 2018-12-04 NOTE — Progress Notes (Signed)
Pt ambulated x 470 feet around unit, pt tolerated well  °

## 2018-12-04 NOTE — Progress Notes (Signed)
[id number] Education completed with pt who voiced understanding. Stressed importance of sternal precautions and staying in the tube. Encouraged IS. Gave ex ed guidelines. Discussed heart healthy food choices. Discussed CRP 2 and referred to [facility] CRP 2. Graylon Good RN BSN 12/04/2018 9:40 AM

## 2018-12-08 ENCOUNTER — Telehealth: Payer: Self-pay | Admitting: *Deleted

## 2018-12-08 NOTE — Telephone Encounter (Signed)
Ms. Curtis Weiss had called this morning with concerns regarding her husband who is s/p CABG X 3...11/30/18.  He is nauseated, constipated and his BP has been running high...160's over 80's, even 198/72, but he had just had cafi fenated coffee.  I discussed remedies for his constipation.  He did have a good bowel movement before discharge. I consulted with Dr. Orvan Seen and he recommended that he stop the amiodarone. I called Ms. Curtis Weiss and relayed his advice.  She says that his BP is coming down. He will see Dr. Orvan Seen on Friday in the office.

## 2018-12-09 ENCOUNTER — Other Ambulatory Visit: Payer: Self-pay | Admitting: Cardiothoracic Surgery

## 2018-12-09 DIAGNOSIS — Z951 Presence of aortocoronary bypass graft: Secondary | ICD-10-CM

## 2018-12-10 ENCOUNTER — Ambulatory Visit
Admission: RE | Admit: 2018-12-10 | Discharge: 2018-12-10 | Disposition: A | Discharge status: Home / Self Care | Payer: Medicare HMO | Source: Ambulatory Visit | Attending: Cardiothoracic Surgery | Admitting: Cardiothoracic Surgery

## 2018-12-10 ENCOUNTER — Ambulatory Visit (INDEPENDENT_AMBULATORY_CARE_PROVIDER_SITE_OTHER): Payer: Self-pay | Admitting: Cardiothoracic Surgery

## 2018-12-10 ENCOUNTER — Other Ambulatory Visit: Payer: Self-pay

## 2018-12-10 VITALS — BP 130/73 | HR 87 | Temp 97.6°F | Resp 20 | Ht 72.0 in | Wt 204.0 lb

## 2018-12-10 DIAGNOSIS — Z951 Presence of aortocoronary bypass graft: Secondary | ICD-10-CM

## 2018-12-10 DIAGNOSIS — I2510 Atherosclerotic heart disease of native coronary artery without angina pectoris: Secondary | ICD-10-CM

## 2018-12-10 MED ORDER — METOPROLOL TARTRATE 25 MG PO TABS: 12.5000 mg | ORAL_TABLET | Freq: Two times a day (BID) | ORAL | 3 refills | Status: DC

## 2018-12-11 NOTE — Progress Notes (Signed)
      [address]       [place] [address]             [place] OFFICE NOTE  Referring Provider is Drake Leach, MD Primary Cardiologist is Kirk Ruths, MD PCP is Drake Leach, MD   HPI:  73 yo man s/p CABG performed recently without significant complication. He presents for 1st postoperative visit. Denies chest pain but he has had abdominal pain/discomfort until this morning when a relative degree of constipation resolved. His energy level is improving. Denies shortness of breath. Reports mild drainage from isolated spot at the middle of sternal incision.    Current Outpatient Medications  Medication Sig Dispense Refill  . aspirin EC 81 MG tablet Take 81 mg by mouth daily.    Marland Kitchen atorvastatin (LIPITOR) 80 MG tablet Take 1 tablet (80 mg total) by mouth daily at 6 PM. 30 tablet 1  . clopidogrel (PLAVIX) 75 MG tablet Take 1 tablet (75 mg total) by mouth daily. 30 tablet 1  . lisinopril (ZESTRIL) 20 MG tablet Take 1 tablet (20 mg total) by mouth daily. 30 tablet 1  . Multiple Vitamins-Minerals (MULTIVITAMIN PO) Take 1 tablet by mouth daily.    . traMADol (ULTRAM) 50 MG tablet Take 1 tablet (50 mg total) by mouth every 6 (six) hours as needed for moderate pain. 28 tablet 0  . amiodarone (PACERONE) 200 MG tablet Take 1 tablet (200 mg total) by mouth daily. (Patient not taking: Reported on 12/10/2018) 30 tablet 1  . metoprolol tartrate (LOPRESSOR) 25 MG tablet Take 0.5 tablets (12.5 mg total) by mouth 2 (two) times daily. 90 tablet 3   No current facility-administered medications for this visit.       Physical Exam:   BP 130/73   Pulse 87   Temp 97.6 F (36.4 C) (Skin)   Resp 20   Ht 6' (1.829 m)   Wt 92.5 kg   SpO2 97% Comment: RA  BMI 27.67 kg/m   General:  Well-appearing, NAD  Chest:   cta  CV:   rrr  Incisions:  Small area of skin dehiscence in mid-sternum with scant clear drainage.   Abdomen:  sntnd  Extremities:   No edema  Diagnostic Tests: cxr with clear lung fields   Impression:  Doing well after CABG  Plan: F/u in 2 weeks to assess sternal incision; report any worse drainage or wound redness  I spent in excess of 15 minutes during the conduct of this office consultation and >50% of this time involved direct face-to-face encounter with the patient for counseling and/or coordination of their care.  Level 2                 10 minutes Level 3                 15 minutes Level 4                 25 minutes Level 5                 40 minutes  B. Murvin Natal, MD 12/11/2018 2:42 PM

## 2018-12-15 ENCOUNTER — Encounter: Payer: Self-pay | Admitting: Adult Health

## 2018-12-15 ENCOUNTER — Ambulatory Visit: Payer: Medicare HMO | Admitting: Adult Health

## 2018-12-15 ENCOUNTER — Other Ambulatory Visit: Payer: Self-pay

## 2018-12-15 VITALS — BP 109/58 | HR 76 | Temp 97.6°F | Ht 72.0 in | Wt 202.0 lb

## 2018-12-15 DIAGNOSIS — M549 Dorsalgia, unspecified: Secondary | ICD-10-CM

## 2018-12-15 DIAGNOSIS — I2510 Atherosclerotic heart disease of native coronary artery without angina pectoris: Secondary | ICD-10-CM

## 2018-12-15 DIAGNOSIS — I952 Hypotension due to drugs: Secondary | ICD-10-CM

## 2018-12-15 DIAGNOSIS — E7800 Pure hypercholesterolemia, unspecified: Secondary | ICD-10-CM

## 2018-12-15 DIAGNOSIS — Z951 Presence of aortocoronary bypass graft: Secondary | ICD-10-CM | POA: Diagnosis not present

## 2018-12-15 NOTE — Progress Notes (Signed)
Cardiology Office Note   Date:  12/15/2018   ID:  Curtis Weiss, DOB 1945-08-24, MRN [id number]  PCP:  Drake Leach, MD  Cardiologist:  Dr. Stanford Breed  CC: Post Hospitalization    History of Present Illness: Curtis Weiss is a 73 y.o. male who presents for posthospitalization follow-up after admission for chest pain, on 11/24/2018.  He has a known history of hypertension, sinus bradycardia, AAA, (measuring 2.2 cm) hypercholesterolemia, atherosclerosis, idiopathic peripheral neuropathy..  In the ER his ED revealed sinus rhythm with minimal T wave inversion in aVR and V1, however troponin has been steadily increasing.  Repeat EKG showed T wave inversion in the anterior lateral leads.  He was planned for diagnostic and therapeutic left heart cath.  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  The circumflex to OM was 30% stenosed with well preserved LVEF and normal LVEDP.  Consult to CVTS for 3 vessels severe CAD with no good targets for PCI was requested  Patient subsequently underwent coronary artery bypass grafting x3 with LIMA to LAD, right IMA to PDA, SVG to diagonal branch, with endoscopic vein harvest from the right thigh application of atrial appendage clip and completion of graft surveillance with inducing green thoracic imaging.  On discharge she was taken off of amlodipine, doxazosin, quinapril and metoprolol.  He was started on amiodarone 200 mg daily, aspirin 81 mg daily atorvastatin 80 mg daily, clopidogrel 75 mg daily, lisinopril 20 mg daily, and  tramadol 50 mg daily for pain.  He comes today feeling miserable. He is light headed, has no appetite, no energy , and complains of constipation. His wife states he was doing well until the weekend when everything went downhill. He also complains of back pain on the lower left.   Past Medical History:  Diagnosis Date   AAA (abdominal aortic aneurysm) without rupture (HCC)    Chronic insomnia    Herniated lumbar intervertebral disc    Hypertension    Idiopathic peripheral neuropathy    Pure hypercholesterolemia     Past Surgical History:  Procedure Laterality Date   Arm surgery     CLIPPING OF ATRIAL APPENDAGE N/A 11/30/2018   Procedure: Clipping Of Atrial Appendage - using AtriCure clip size 22mm;  Surgeon: Wonda Olds, MD;  Location: [facility];  Service: Open Heart Surgery;  Laterality: N/A;   CORONARY ARTERY BYPASS GRAFT N/A 11/30/2018   Procedure: CORONARY ARTERY BYPASS GRAFTING (CABG) x three, using bilateral mammary arteries and right leg greater saphenous vein harvested endoscopically;  Surgeon: Wonda Olds, MD;  Location: [facility];  Service: Open Heart Surgery;  Laterality: N/A;   LEFT HEART CATH AND CORONARY ANGIOGRAPHY N/A 11/26/2018   Procedure: LEFT HEART CATH AND CORONARY ANGIOGRAPHY;  Surgeon: Leonie Man, MD;  Location: [facility];  Service: Cardiovascular;  Laterality: N/A;   SHOULDER SURGERY     TEE WITHOUT CARDIOVERSION N/A 11/30/2018   Procedure: TRANSESOPHAGEAL ECHOCARDIOGRAM (TEE);  Surgeon: Wonda Olds, MD;  Location: [facility];  Service: Open Heart Surgery;  Laterality: N/A;     Current Outpatient  Medications  Medication Sig Dispense Refill   aspirin EC 81 MG tablet Take 81 mg by mouth daily.     atorvastatin (LIPITOR) 80 MG tablet Take 1 tablet (80 mg total) by mouth daily at 6 PM. 30 tablet 1   clopidogrel (PLAVIX) 75 MG tablet Take 1 tablet (75 mg total) by mouth daily. 30 tablet 1   lisinopril (ZESTRIL) 20 MG tablet Take  10 mg by mouth daily.     metoprolol tartrate (LOPRESSOR) 25 MG tablet Take 0.5 tablets (12.5 mg total) by mouth 2 (two) times daily. 90 tablet 3   Multiple Vitamins-Minerals (MULTIVITAMIN PO) Take 1 tablet by mouth daily.     traMADol (ULTRAM) 50 MG tablet Take 1 tablet (50 mg total) by mouth every 6 (six) hours as needed for moderate pain. 28 tablet 0   No current facility-administered medications for this visit.     Allergies:   Patient has no known allergies.    Social History:  The patient  reports that he has quit smoking. He has never used smokeless tobacco. He reports current alcohol use.   Family History:  The patient's family history includes Hypertension in his father and mother.    ROS: All other systems are reviewed and negative. Unless otherwise mentioned in H&P    PHYSICAL EXAM: VS:  BP (!) 109/58    Pulse 76    Temp 97.6 F (36.4 C)    Ht 6' (1.829 m)    Wt 202 lb (91.6 kg)    SpO2 96%    BMI 27.40 kg/m  , BMI Body mass index is 27.4 kg/m. GEN: Well nourished, well developed, in no acute distress HEENT: normal Neck: no JVD, carotid bruits, or masses Cardiac: RRR, tachycardic; no murmurs, rubs, or gallops,no edema  Respiratory:  Clear to auscultation bilaterally, normal work of breathing, scant crackles in the bases, cleared with coughing.  GI: soft, nontender, nondistended, + BS MS: no deformity or atrophy. Well healed sternotomy incision and vein harvest site to the right leg. Skin: warm and dry, no rash Neuro:  Strength and sensation are intact, dizzy Psych: euthymic mood, full affect   EKG: NSR, rate of 76 bpm, ST and T wave abnormalities inferior and inferferio aterally.   Recent Labs: 11/26/2018: ALT 23 12/01/2018: Magnesium 2.5; TSH 1.839 12/03/2018: BUN 15; Creatinine, Ser 1.20; Hemoglobin 10.2; Platelets 112; Potassium 3.8; Sodium 140    Lipid Panel    Component Value Date/Time   CHOL 213 (H) 11/26/2018 0025   TRIG 254 (H) 11/26/2018 0025    HDL 31 (L) 11/26/2018 0025   CHOLHDL 6.9 11/26/2018 0025   VLDL 51 (H) 11/26/2018 0025   LDLCALC 131 (H) 11/26/2018 0025      Wt Readings from Last 3 Encounters:  12/15/18 202 lb (91.6 kg)  12/10/18 204 lb (92.5 kg)  12/04/18 217 lb 2.5 oz (98.5 kg)      Other studies Reviewed: LHC 11/26/2018  Ost LAD to Prox LAD lesion is 60% stenosed. Prox LAD to Mid LAD lesion is 50% stenosed.  Mid LAD lesion is 90% stenosed with 95% stenosed side branch in 1st Diag. Hazy globular calcified lesion at bifurcation  Mid LAD to Dist LAD lesion is 35% stenosed. Mid Cx lesion is 35% stenosed. Not significant  Proximal RCA: Prox RCA-1 lesion is 70% stenosed -tapering to Prox RCA-2 lesion is 90% stenosed, follwed by Prox RCA-3 lesion is 50% stenosed.  Mid RCA lesion is 99% stenosed with 90% stenosed side branch  in Acute Mrg. Hazy globular calcified lesion at branch point  The left ventricular systolic function is normal.  LV end diastolic pressure is normal.  The left ventricular ejection fraction is 55-65% by visual estimate.  There is no aortic valve stenosis.   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  Moderate LCx-OM 30%  Well preserved LVEF with normal LVEDP.   Doppler pre-CABG 11/28/2018 Summary: Right Carotid: Velocities in the right ICA are consistent with a 1-39% stenosis.  Left Carotid: Velocities in the left ICA are consistent with a 1-39% stenosis. Vertebrals:  Bilateral vertebral arteries demonstrate antegrade flow. Subclavians: Normal flow hemodynamics  were seen in bilateral subclavian              arteries.  Right Upper Extremity: No significant arterial obstruction detected in the right upper extremity. Left Upper Extremity: No significant arterial obstruction detected in the left upper extremity.   Lower Extremities: Bilateral pedal waveforms are within normal limits.  ASSESSMENT AND PLAN:  1. Hypotension: I think he may be on too much medication which maybe causing his symptoms. I have rechecked his BP in the office. 104/58. I will decrease the lisinopril to 10 mg from 20 mg, which he will begin tomorrow am. I have asked then to take his BP at the same time everyday after he has had is medications. He is not to take the lisinopril if BP is < 110/50.Will check BMET  2. CAD: S/P CABG X 3.  He has seen his CVTS surgeon this week with CXR which did not reveal significant pleural effusion. Good healing of sternotomy site. Will check CBC   3. AAA: Most recent measurement of 2.2 cm.  No evidence of dissection on exam, despite left lower back pain.  No ripping abdominal pain.   4. R/O UTI: His wife would like [place] to do a UA on this today.   5. Lumbar disc disease:  May need to see PCP if pain persists.   6.Hypercholesterolemia: Continue statin therapy. Will have follow up labs in 3 months, goal of LDL < 70.   Current medicines are reviewed at length with the patient today.    Labs/ tests ordered today include: CBC BMET UA  Phill Myron. West Pugh, ANP, AACC   12/15/2018 12:44 PM    [facility] [place] [address] Office [phone] Fax [phone]  Notice: This dictation was prepared with Dragon dictation along with smaller phrase technology. Any transcriptional errors that result from this process are unintentional and may not be corrected upon review.

## 2018-12-15 NOTE — Patient Instructions (Signed)
Medication Instructions:  DECREASE- Lisinopril 10 mg by mouth daily  *If you need a refill on your cardiac medications before your next appointment, please call your pharmacy*  Lab Work: CBC, BMP and Urinalysis Today  If you have labs (blood work) drawn today and your tests are completely normal, you will receive your results only by: Marland Kitchen MyChart Message (if you have MyChart) OR . A paper copy in the mail If you have any lab test that is abnormal or we need to change your treatment, we will call you to review the results.  Testing/Procedures: None Ordered  Follow-Up: At [facility], you and your health needs are our priority.  As part of our continuing mission to provide you with exceptional heart care, we have created designated Provider Care Teams.  These Care Teams include your primary Cardiologist (physician) and Advanced Practice Providers (APPs -  Physician Assistants and Nurse Practitioners) who all work together to provide you with the care you need, when you need it.  Your next appointment:   2 weeks  The format for your next appointment:   In Person  Provider:   Jory Sims, DNP, ANP

## 2018-12-16 LAB — URINALYSIS
Bilirubin, UA: NEGATIVE
Glucose, UA: NEGATIVE
Nitrite, UA: POSITIVE — AB
RBC, UA: NEGATIVE
Specific Gravity, UA: 1.023 (ref 1.005–1.030)
Urobilinogen, Ur: 1 mg/dL (ref 0.2–1.0)
pH, UA: 5.5 (ref 5.0–7.5)

## 2018-12-16 LAB — CBC
Hematocrit: 40 % (ref 37.5–51.0)
Hemoglobin: 13.7 g/dL (ref 13.0–17.7)
MCH: 33.3 pg — ABNORMAL HIGH (ref 26.6–33.0)
MCHC: 34.3 g/dL (ref 31.5–35.7)
MCV: 97 fL (ref 79–97)
Platelets: 467 10*3/uL — ABNORMAL HIGH (ref 150–450)
RBC: 4.11 x10E6/uL — ABNORMAL LOW (ref 4.14–5.80)
RDW: 11.9 % (ref 11.6–15.4)
WBC: 16.1 10*3/uL — ABNORMAL HIGH (ref 3.4–10.8)

## 2018-12-16 LAB — BASIC METABOLIC PANEL
BUN/Creatinine Ratio: 10 (ref 10–24)
BUN: 13 mg/dL (ref 8–27)
CO2: 21 mmol/L (ref 20–29)
Calcium: 10 mg/dL (ref 8.6–10.2)
Chloride: 94 mmol/L — ABNORMAL LOW (ref 96–106)
Creatinine, Ser: 1.25 mg/dL (ref 0.76–1.27)
GFR calc Af Amer: 66 mL/min/{1.73_m2} (ref >59)
GFR calc non Af Amer: 57 mL/min/{1.73_m2} — ABNORMAL LOW (ref >59)
Glucose: 147 mg/dL — ABNORMAL HIGH (ref 65–99)
Potassium: 5.3 mmol/L — ABNORMAL HIGH (ref 3.5–5.2)
Sodium: 136 mmol/L (ref 134–144)

## 2018-12-21 MED ORDER — CIPROFLOXACIN HCL 500 MG PO TABS: 500.0000 mg | ORAL_TABLET | Freq: Two times a day (BID) | ORAL | 0 refills | Status: DC

## 2018-12-21 NOTE — Telephone Encounter (Signed)
-----   Message from Lendon Colonel, NP sent at 12/16/2018  7:42 AM EST ----- No evidence of urinary tract infection.

## 2018-12-24 ENCOUNTER — Other Ambulatory Visit: Payer: Self-pay

## 2018-12-24 ENCOUNTER — Ambulatory Visit (INDEPENDENT_AMBULATORY_CARE_PROVIDER_SITE_OTHER): Payer: Self-pay | Admitting: Cardiothoracic Surgery

## 2018-12-24 VITALS — BP 128/68 | HR 82 | Temp 97.5°F | Resp 20 | Ht 72.0 in | Wt 202.0 lb

## 2018-12-24 DIAGNOSIS — Z951 Presence of aortocoronary bypass graft: Secondary | ICD-10-CM

## 2018-12-24 DIAGNOSIS — I2510 Atherosclerotic heart disease of native coronary artery without angina pectoris: Secondary | ICD-10-CM

## 2018-12-24 NOTE — Progress Notes (Signed)
      [address]       [place] [address]             [phone]     CARDIOTHORACIC SURGERY OFFICE NOTE  Referring Provider is Stanford Breed, Denice Bors, MD Primary Cardiologist is Kirk Ruths, MD PCP is Drake Leach, MD   HPI:  73 yo man s/p CABG. Doing well; had UTI treated with abx recently. Losing a little weight, but feeling well. No angina or SOB/DOE.   Current Outpatient Medications  Medication Sig Dispense Refill  . aspirin EC 81 MG tablet Take 81 mg by mouth daily.    Marland Kitchen atorvastatin (LIPITOR) 80 MG tablet Take 1 tablet (80 mg total) by mouth daily at 6 PM. 30 tablet 1  . ciprofloxacin (CIPRO) 500 MG tablet Take 1 tablet (500 mg total) by mouth 2 (two) times daily. For 7 days 14 tablet 0  . clopidogrel (PLAVIX) 75 MG tablet Take 1 tablet (75 mg total) by mouth daily. 30 tablet 1  . lisinopril (ZESTRIL) 20 MG tablet Take 10 mg by mouth daily.    . metoprolol tartrate (LOPRESSOR) 25 MG tablet Take 0.5 tablets (12.5 mg total) by mouth 2 (two) times daily. 90 tablet 3  . Multiple Vitamins-Minerals (MULTIVITAMIN PO) Take 1 tablet by mouth daily.    . traMADol (ULTRAM) 50 MG tablet Take 1 tablet (50 mg total) by mouth every 6 (six) hours as needed for moderate pain. 28 tablet 0   No current facility-administered medications for this visit.       Physical Exam:   BP 128/68   Pulse 82   Temp (!) 97.5 F (36.4 C) (Skin)   Resp 20   Ht 6' (1.829 m)   Wt 91.6 kg   SpO2 95% Comment: RA  BMI 27.40 kg/m   General:  Well-appearing, NAD  Chest:   cta  CV:   rrr  Incisions:  C/d/i  Abdomen:  sntnd  Extremities:  No edema  Diagnostic Tests: None new   Impression:  Doing well after CABG Plan:  F/u as needed Ok to drive vehicle Liberalize activity including lifting  I spent in excess of 15  minutes during the conduct of this office consultation and >50% of this time involved direct face-to-face encounter with the patient for counseling and/or  coordination of their care.  Level 2                 10 minutes Level 3                 15 minutes Level 4                 25 minutes Level 5                 40 minutes  B. Murvin Natal, MD 12/24/2018 12:16 PM

## 2018-12-27 NOTE — Progress Notes (Signed)
Cardiology Office Note   Date:  12/29/2018   ID:  Curtis Weiss, DOB [birth year], MRN [id number]  PCP:  Drake Leach, MD  Cardiologist: Dr. Stanford Breed CC: Follow Up    History of Present Illness: Curtis Weiss is a 73 y.o. male who presents for ongoing assessment and management of coronary artery disease, status post coronary artery bypass grafting x3 with LIMA to LAD, right IMA to PDA, SVG to diagonal branch, and endoscopic vein harvest from the right thigh, with application of appendage clip and completion of graft surveillance with green thoracic imaging.  He was recently treated for UTI.  He was last seen on 12/15/2018 and was feeling miserable, lightheaded, no appetite, no energy with complaints of constipation.  I decreased his lisinopril to 10 mg from 20 mg, he was to take his blood pressure at the same time every day.  He was not to take his lisinopril but his blood pressure was less than 110/50.  A BMET was checked.  He saw Dr. Orvan Seen CVTS on 12/24/2018 and was feeling much better.  Blood pressure was 128/68, pulse 82.  He was afebrile.  He was released to drive, and told to follow-up as needed.  He is doing much better today.  He is finishing up his antibiotic today he has more energy, blood pressure is much better with lower dose of lisinopril.  He does complain of a little bit of a cough over the last week or so.  He has been walking a mile a day.   Past Medical History:  Diagnosis Date  . AAA (abdominal aortic aneurysm) without rupture ([facility])   . Chronic insomnia   . Herniated lumbar intervertebral disc   . Hypertension   . Idiopathic peripheral neuropathy   . Pure hypercholesterolemia     Past Surgical History:  Procedure Laterality Date  . Arm surgery    . CLIPPING OF ATRIAL APPENDAGE N/A 11/30/2018   Procedure: Clipping Of Atrial Appendage - using AtriCure clip size 73mm;  Surgeon: Wonda Olds, MD;  Location: [facility];  Service: Open Heart Surgery;  Laterality: N/A;   . CORONARY ARTERY BYPASS GRAFT N/A 11/30/2018   Procedure: CORONARY ARTERY BYPASS GRAFTING (CABG) x three, using bilateral mammary arteries and right leg greater saphenous vein harvested endoscopically;  Surgeon: Wonda Olds, MD;  Location: [facility];  Service: Open Heart Surgery;  Laterality: N/A;  . LEFT HEART CATH AND CORONARY ANGIOGRAPHY N/A 11/26/2018   Procedure: LEFT HEART CATH AND CORONARY ANGIOGRAPHY;  Surgeon: Leonie Man, MD;  Location: [facility];  Service: Cardiovascular;  Laterality: N/A;  . SHOULDER SURGERY    . TEE WITHOUT CARDIOVERSION N/A 11/30/2018   Procedure: TRANSESOPHAGEAL ECHOCARDIOGRAM (TEE);  Surgeon: Wonda Olds, MD;  Location: [facility];  Service: Open Heart Surgery;  Laterality: N/A;     Current Outpatient Medications  Medication Sig Dispense Refill  . aspirin EC 81 MG tablet Take 81 mg by mouth daily.    Marland Kitchen atorvastatin (LIPITOR) 80 MG tablet Take 1 tablet (80 mg total) by mouth daily at 6 PM. 90 tablet 3  . clopidogrel (PLAVIX) 75 MG tablet Take 1 tablet (75 mg total) by mouth daily. 90 tablet 3  . lisinopril (ZESTRIL) 20 MG tablet Take 0.5 tablets (10 mg total) by mouth daily. 15 tablet 0  . metoprolol tartrate (LOPRESSOR) 25 MG tablet Take 0.5 tablets (12.5 mg total) by mouth 2 (two) times daily. 90 tablet 3  . Multiple Vitamins-Minerals (MULTIVITAMIN  PO) Take 1 tablet by mouth daily.     No current facility-administered medications for this visit.     Allergies:   Patient has no known allergies.    Social History:  The patient  reports that he has quit smoking. He has never used smokeless tobacco. He reports current alcohol use.   Family History:  The patient's family history includes Hypertension in his father and mother.    ROS: All other systems are reviewed and negative. Unless otherwise mentioned in H&P    PHYSICAL EXAM: VS:  BP 132/64   Pulse 74   Temp 98.1 F (36.7 C)   Ht 6' (1.829 m)   Wt 207 lb 9.6 oz (94.2 kg)    SpO2 97%   BMI 28.16 kg/m  , BMI Body mass index is 28.16 kg/m. GEN: Well nourished, well developed, in no acute distress HEENT: normal Neck: no JVD, carotid bruits, or masses Cardiac: RRR; no murmurs, rubs, or gallops,no edema  Respiratory:  Clear to auscultation bilaterally, normal work of breathing GI: soft, nontender, nondistended, + BS MS: no deformity or atrophy.  Sternotomy site continues to heal very well. Skin: warm and dry, no rash Neuro:  Strength and sensation are intact Psych: euthymic mood, full affect   EKG: Not completed this office visit  Recent Labs: 11/26/2018: ALT 23 12/01/2018: Magnesium 2.5; TSH 1.839 12/15/2018: BUN 13; Creatinine, Ser 1.25; Hemoglobin 13.7; Platelets 467; Potassium 5.3; Sodium 136    Lipid Panel    Component Value Date/Time   CHOL 213 (H) 11/26/2018 0025   TRIG 254 (H) 11/26/2018 0025   HDL 31 (L) 11/26/2018 0025   CHOLHDL 6.9 11/26/2018 0025   VLDL 51 (H) 11/26/2018 0025   LDLCALC 131 (H) 11/26/2018 0025      Wt Readings from Last 3 Encounters:  12/29/18 207 lb 9.6 oz (94.2 kg)  12/24/18 202 lb (91.6 kg)  12/15/18 202 lb (91.6 kg)      Other studies Reviewed: Other studies Reviewed: LHC 11/26/2018  Ost LAD to Prox LAD lesion is 60% stenosed. Prox LAD to Mid LAD lesion is 50% stenosed.  Mid LAD lesion is 90% stenosed with 95% stenosed side branch in 1st Diag. Hazy globular calcified lesion at bifurcation  Mid LAD to Dist LAD lesion is 35% stenosed. Mid Cx lesion is 35% stenosed. Not significant  Proximal RCA: Prox RCA-1 lesion is 70% stenosed -tapering to Prox RCA-2 lesion is 90% stenosed, follwed by Prox RCA-3 lesion is 50% stenosed.  Mid RCA lesion is 99% stenosed with 90% stenosed side branch in Acute Mrg. Hazy globular calcified lesion at branch point  The left ventricular systolic function is normal.  LV end diastolic pressure is normal.  The left ventricular ejection fraction is 55-65% by visual estimate.   There is no aortic valve stenosis.  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  Moderate LCx-OM 30%  Well preserved LVEF with normal LVEDP.   Doppler pre-CABG 11/28/2018 Summary: Right Carotid: Velocities in the right ICA are consistent with a 1-39% stenosis.  Left Carotid: Velocities in the left ICA are consistent with a  1-39% stenosis. Vertebrals: Bilateral vertebral arteries demonstrate antegrade flow. Subclavians: Normal flow hemodynamics were seen in bilateral subclavian arteries.  Right Upper Extremity: No significant arterial obstruction detected in the right upper extremity. Left Upper Extremity: No significant arterial obstruction detected in the left upper extremity.  Lower Extremities: Bilateral pedal waveforms are within normal limits.    ASSESSMENT AND PLAN:  1.  Coronary artery disease: Status post CABG x3 with application of appendage clip and completion of graft surveillance with green thoracic imaging.Marland Kitchen  Has seen CVTS who has released him for cardiac rehab and to be seen as needed.  He continues on metoprolol 12.5 mg twice daily, lisinopril 10 mg daily, 81 mg of aspirin, and Plavix 75 mg daily.  2.  Hypertension: Was hypotensive on higher dose of lisinopril but is doing much better with 10 mg instead of 20 mg daily.  He does have a bit of a tickle cough that is happened over the last week.  We will continue to monitor this.  If this continues to be an issue we will change to losartan 25 mg daily instead.  3.   Hypercholesterolemia: Continue atorvastatin 80 mg daily.  He will need follow-up lipids and LFTs in February 2020 for ongoing evaluation and management with goal of LDL less than 70.  Current medicines are reviewed at length with the patient today.    Labs/ tests ordered today include: None  Phill Myron. West Pugh, ANP, AACC   12/29/2018 12:06 PM    [facility] [place] [address] Office [phone] Fax [phone]  Notice: This dictation was prepared with Dragon dictation along with smaller phrase technology. Any transcriptional errors that result from this process are unintentional and may not be corrected upon review.

## 2018-12-29 ENCOUNTER — Other Ambulatory Visit: Payer: Self-pay

## 2018-12-29 ENCOUNTER — Ambulatory Visit: Payer: Medicare HMO | Admitting: Adult Health

## 2018-12-29 ENCOUNTER — Encounter: Payer: Self-pay | Admitting: Adult Health

## 2018-12-29 VITALS — BP 132/64 | HR 74 | Temp 98.1°F | Ht 72.0 in | Wt 207.6 lb

## 2018-12-29 DIAGNOSIS — E7800 Pure hypercholesterolemia, unspecified: Secondary | ICD-10-CM

## 2018-12-29 DIAGNOSIS — Z951 Presence of aortocoronary bypass graft: Secondary | ICD-10-CM | POA: Diagnosis not present

## 2018-12-29 DIAGNOSIS — I2510 Atherosclerotic heart disease of native coronary artery without angina pectoris: Secondary | ICD-10-CM

## 2018-12-29 DIAGNOSIS — I10 Essential (primary) hypertension: Secondary | ICD-10-CM | POA: Diagnosis not present

## 2018-12-29 MED ORDER — CLOPIDOGREL BISULFATE 75 MG PO TABS: 75.0000 mg | ORAL_TABLET | Freq: Every day | ORAL | 3 refills | Status: DC

## 2018-12-29 MED ORDER — LISINOPRIL 20 MG PO TABS: 10.0000 mg | ORAL_TABLET | Freq: Every day | ORAL | 0 refills | Status: DC

## 2018-12-29 MED ORDER — ATORVASTATIN CALCIUM 80 MG PO TABS: 80.0000 mg | ORAL_TABLET | Freq: Every day | ORAL | 3 refills | Status: DC

## 2018-12-29 NOTE — Patient Instructions (Signed)
Medication Instructions:  Curtis Sims, DNP recommends that you continue on your current medications as directed. Please refer to the Current Medication list given to you today.  *If you need a refill on your cardiac medications before your next appointment, please call your pharmacy*  PLEASE SEND A [place].  Follow-Up: At [facility], you and your health needs are our priority.  As part of our continuing mission to provide you with exceptional heart care, we have created designated Provider Care Teams.  These Care Teams include your primary Cardiologist (physician) and Advanced Practice Providers (APPs -  Physician Assistants and Nurse Practitioners) who all work together to provide you with the care you need, when you need it.  Your next appointment:   Monday, 01/24/2019 at 9:40 am   The format for your next appointment:   In Person  Provider:   Dr Stanford Breed

## 2019-01-03 MED ORDER — LOSARTAN POTASSIUM 25 MG PO TABS: 25.0000 mg | ORAL_TABLET | Freq: Every day | ORAL | 3 refills | Status: DC

## 2019-01-18 NOTE — Progress Notes (Signed)
HPI: FU CAD and hypertension.Based on outside records he has had renal Dopplers previously that showed no renal artery stenosis and normal catecholamine and cortisol levels. Abdominal ultrasound September 2020 showed 3.3 cm abdominal aortic aneurysm. There was greater than 50% stenosis in the right common iliac artery.  Echocardiogram September 2020 showed normal LV function, moderate left atrial enlargement, mild mitral and tricuspid regurgitation.  Cardiac catheterization October 2020 showed significant LAD and RCA stenosis.  Carotid Dopplers October 2020 showed 1 to 39% bilateral stenosis.  Patient underwent coronary artery bypass and graft October 2020 with LIMA to the LAD, RIMA to the PDA and saphenous vein graft to the diagonal.  At follow-up office visit patient found to have low blood pressure and medications reduced.  Since last seen,  he denies dyspnea, chest pain, palpitations or syncope.  His blood pressure is now running high again with systolics in the 0000000 range.  Current Outpatient Medications  Medication Sig Dispense Refill  . aspirin EC 81 MG tablet Take 81 mg by mouth daily.    Marland Kitchen atorvastatin (LIPITOR) 80 MG tablet Take 1 tablet (80 mg total) by mouth daily at 6 PM. 90 tablet 3  . clopidogrel (PLAVIX) 75 MG tablet Take 1 tablet (75 mg total) by mouth daily. 90 tablet 3  . losartan (COZAAR) 25 MG tablet Take 1 tablet (25 mg total) by mouth daily. 90 tablet 3  . metoprolol tartrate (LOPRESSOR) 25 MG tablet Take 0.5 tablets (12.5 mg total) by mouth 2 (two) times daily. 90 tablet 3  . Multiple Vitamins-Minerals (MULTIVITAMIN PO) Take 1 tablet by mouth daily.     No current facility-administered medications for this visit.     Past Medical History:  Diagnosis Date  . AAA (abdominal aortic aneurysm) without rupture ([place])   . Chronic insomnia   . Herniated lumbar intervertebral disc   . Hypertension   . Idiopathic peripheral neuropathy   . Pure hypercholesterolemia      Past Surgical History:  Procedure Laterality Date  . Arm surgery    . CLIPPING OF ATRIAL APPENDAGE N/A 11/30/2018   Procedure: Clipping Of Atrial Appendage - using AtriCure clip size 83mm;  Surgeon: Wonda Olds, MD;  Location: [facility];  Service: Open Heart Surgery;  Laterality: N/A;  . CORONARY ARTERY BYPASS GRAFT N/A 11/30/2018   Procedure: CORONARY ARTERY BYPASS GRAFTING (CABG) x three, using bilateral mammary arteries and right leg greater saphenous vein harvested endoscopically;  Surgeon: Wonda Olds, MD;  Location: [place];  Service: Open Heart Surgery;  Laterality: N/A;  . LEFT HEART CATH AND CORONARY ANGIOGRAPHY N/A 11/26/2018   Procedure: LEFT HEART CATH AND CORONARY ANGIOGRAPHY;  Surgeon: Leonie Man, MD;  Location: [place] [facility];  Service: Cardiovascular;  Laterality: N/A;  . SHOULDER SURGERY    . TEE WITHOUT CARDIOVERSION N/A 11/30/2018   Procedure: TRANSESOPHAGEAL ECHOCARDIOGRAM (TEE);  Surgeon: Wonda Olds, MD;  Location: [place];  Service: Open Heart Surgery;  Laterality: N/A;    Social History   Socioeconomic History  . Marital status: Married    Spouse name: Not on file  . Number of children: 3  . Years of education: Not on file  . Highest education level: Not on file  Occupational History    Comment: Retired  Tobacco Use  . Smoking status: Former Research scientist (life sciences)  . Smokeless tobacco: Never Used  Substance and Sexual Activity  . Alcohol use: Yes    Comment: 14  . Drug  use: Not on file  . Sexual activity: Not on file  Other Topics Concern  . Not on file  Social History Narrative  . Not on file   Social Determinants of Health   Financial Resource Strain:   . Difficulty of Paying Living Expenses: Not on file  Food Insecurity:   . Worried About Charity fundraiser in the Last Year: Not on file  . Ran Out of Food in the Last Year: Not on file  Transportation Needs:   . Lack of Transportation (Medical): Not on file  . Lack of Transportation  (Non-Medical): Not on file  Physical Activity:   . Days of Exercise per Week: Not on file  . Minutes of Exercise per Session: Not on file  Stress: No Stress Concern Present  . Feeling of Stress : Not at all  Social Connections:   . Frequency of Communication with Friends and Family: Not on file  . Frequency of Social Gatherings with Friends and Family: Not on file  . Attends Religious Services: Not on file  . Active Member of Clubs or Organizations: Not on file  . Attends Archivist Meetings: Not on file  . Marital Status: Not on file  Intimate Partner Violence:   . Fear of Current or Ex-Partner: Not on file  . Emotionally Abused: Not on file  . Physically Abused: Not on file  . Sexually Abused: Not on file    Family History  Problem Relation Age of Onset  . Hypertension Mother   . Hypertension Father     ROS: no fevers or chills, productive cough, hemoptysis, dysphasia, odynophagia, melena, hematochezia, dysuria, hematuria, rash, seizure activity, orthopnea, PND, pedal edema, claudication. Remaining systems are negative.  Physical Exam: Well-developed well-nourished in no acute distress.  Skin is warm and dry.  HEENT is normal.  Neck is supple.  Chest is clear to auscultation with normal expansion.  Status post tenotomy with no evidence of infection. Cardiovascular exam is regular rate and rhythm.  Abdominal exam nontender or distended. No masses palpated. Extremities show no edema. neuro grossly intact  A/P  1 coronary artery disease status post coronary artery bypass graft-patient doing well following recent surgery.  Plan to continue aspirin and statin.  2 hypertension-blood pressure elevated; increase losartan to 50 mg daily.  I have asked him to track his blood pressure at home.  If in 1 week we are not at goal (systolic blood pressure [id number] and diastolic 85) advanced to [id number] mg daily.  We will add additional medications as needed.  Check potassium and renal  function in 2 weeks.  3 hyperlipidemia-continue statin.  Check lipids and liver.  4 abdominal aortic aneurysm-plan follow-up ultrasound Sept 2021.  Kirk Ruths, MD

## 2019-01-24 ENCOUNTER — Ambulatory Visit: Payer: Medicare HMO | Admitting: Cardiology

## 2019-01-24 ENCOUNTER — Encounter: Payer: Self-pay | Admitting: Cardiology

## 2019-01-24 ENCOUNTER — Other Ambulatory Visit: Payer: Self-pay

## 2019-01-24 VITALS — BP 158/90 | HR 72 | Temp 95.5°F | Ht 72.0 in | Wt 209.0 lb

## 2019-01-24 DIAGNOSIS — I10 Essential (primary) hypertension: Secondary | ICD-10-CM | POA: Diagnosis not present

## 2019-01-24 DIAGNOSIS — I714 Abdominal aortic aneurysm, without rupture, unspecified: Secondary | ICD-10-CM

## 2019-01-24 DIAGNOSIS — E7800 Pure hypercholesterolemia, unspecified: Secondary | ICD-10-CM

## 2019-01-24 DIAGNOSIS — I2510 Atherosclerotic heart disease of native coronary artery without angina pectoris: Secondary | ICD-10-CM

## 2019-01-24 MED ORDER — LOSARTAN POTASSIUM 50 MG PO TABS: 50.0000 mg | ORAL_TABLET | Freq: Every day | ORAL | 3 refills | Status: DC

## 2019-01-24 NOTE — Patient Instructions (Signed)
Medication Instructions:  INCREASE LOSARTAN TO 50MG  DAILY IF BP IS NOT BETTER IN ONE WEEK INCREASE LOSARTAN TO 100MG  DAILY *If you need a refill on your cardiac medications before your next appointment, please call your pharmacy*  Lab Work: Your physician recommends that you return for lab work in 2 weeks (BMP, L/L)  If you have labs (blood work) drawn today and your tests are completely normal, you will receive your results only by: Marland Kitchen MyChart Message (if you have MyChart) OR . A paper copy in the mail If you have any lab test that is abnormal or we need to change your treatment, we will call you to review the results.  Testing/Procedures: NONE NEEDED  Follow-Up: At [facility], you and your health needs are our priority.  As part of our continuing mission to provide you with exceptional heart care, we have created designated Provider Care Teams.  These Care Teams include your primary Cardiologist (physician) and Advanced Practice Providers (APPs -  Physician Assistants and Nurse Practitioners) who all work together to provide you with the care you need, when you need it.  Your next appointment:   3 month(s)  The format for your next appointment:   In Person  Provider:   You may see Kirk Ruths, MD or one of the following Advanced Practice Providers on your designated Care Team:    Kerin Ransom, PA-C  [place], [place]  Coletta Memos, [place]

## 2019-01-26 DIAGNOSIS — I10 Essential (primary) hypertension: Secondary | ICD-10-CM | POA: Diagnosis not present

## 2019-01-26 DIAGNOSIS — M79671 Pain in right foot: Secondary | ICD-10-CM | POA: Diagnosis not present

## 2019-02-03 ENCOUNTER — Emergency Department (HOSPITAL_BASED_OUTPATIENT_CLINIC_OR_DEPARTMENT_OTHER)
Admission: EM | Admit: 2019-02-03 | Discharge: 2019-02-03 | Disposition: A | Discharge status: Home / Self Care | Payer: Medicare HMO | Attending: Emergency Medicine | Admitting: Emergency Medicine

## 2019-02-03 ENCOUNTER — Telehealth: Payer: Self-pay | Admitting: Cardiology

## 2019-02-03 ENCOUNTER — Encounter (HOSPITAL_BASED_OUTPATIENT_CLINIC_OR_DEPARTMENT_OTHER): Payer: Self-pay

## 2019-02-03 ENCOUNTER — Other Ambulatory Visit: Payer: Self-pay

## 2019-02-03 DIAGNOSIS — Z7901 Long term (current) use of anticoagulants: Secondary | ICD-10-CM | POA: Insufficient documentation

## 2019-02-03 DIAGNOSIS — I10 Essential (primary) hypertension: Secondary | ICD-10-CM | POA: Diagnosis not present

## 2019-02-03 DIAGNOSIS — Z87891 Personal history of nicotine dependence: Secondary | ICD-10-CM | POA: Diagnosis not present

## 2019-02-03 DIAGNOSIS — Z7982 Long term (current) use of aspirin: Secondary | ICD-10-CM | POA: Diagnosis not present

## 2019-02-03 DIAGNOSIS — Z79899 Other long term (current) drug therapy: Secondary | ICD-10-CM | POA: Insufficient documentation

## 2019-02-03 DIAGNOSIS — I2510 Atherosclerotic heart disease of native coronary artery without angina pectoris: Secondary | ICD-10-CM | POA: Diagnosis not present

## 2019-02-03 DIAGNOSIS — Z951 Presence of aortocoronary bypass graft: Secondary | ICD-10-CM | POA: Diagnosis not present

## 2019-02-03 LAB — COMPREHENSIVE METABOLIC PANEL
ALT: 28 U/L (ref 0–44)
AST: 25 U/L (ref 15–41)
Albumin: 4 g/dL (ref 3.5–5.0)
Alkaline Phosphatase: 105 U/L (ref 38–126)
Anion gap: 9 (ref 5–15)
BUN: 15 mg/dL (ref 8–23)
CO2: 25 mmol/L (ref 22–32)
Calcium: 9.2 mg/dL (ref 8.9–10.3)
Chloride: 106 mmol/L (ref 98–111)
Creatinine, Ser: 0.78 mg/dL (ref 0.61–1.24)
GFR calc Af Amer: >60 mL/min (ref >60)
GFR calc non Af Amer: >60 mL/min (ref >60)
Glucose, Bld: 126 mg/dL — ABNORMAL HIGH (ref 70–99)
Potassium: 3.7 mmol/L (ref 3.5–5.1)
Sodium: 140 mmol/L (ref 135–145)
Total Bilirubin: 0.7 mg/dL (ref 0.3–1.2)
Total Protein: 7.6 g/dL (ref 6.5–8.1)

## 2019-02-03 LAB — CBC WITH DIFFERENTIAL/PLATELET
Abs Immature Granulocytes: 0.01 10*3/uL (ref 0.00–0.07)
Basophils Absolute: 0 10*3/uL (ref 0.0–0.1)
Basophils Relative: 1 %
Eosinophils Absolute: 0.5 10*3/uL (ref 0.0–0.5)
Eosinophils Relative: 7 %
HCT: 38.8 % — ABNORMAL LOW (ref 39.0–52.0)
Hemoglobin: 12.4 g/dL — ABNORMAL LOW (ref 13.0–17.0)
Immature Granulocytes: 0 %
Lymphocytes Relative: 20 %
Lymphs Abs: 1.3 10*3/uL (ref 0.7–4.0)
MCH: 30.3 pg (ref 26.0–34.0)
MCHC: 32 g/dL (ref 30.0–36.0)
MCV: 94.9 fL (ref 80.0–100.0)
Monocytes Absolute: 0.7 10*3/uL (ref 0.1–1.0)
Monocytes Relative: 11 %
Neutro Abs: 4.1 10*3/uL (ref 1.7–7.7)
Neutrophils Relative %: 61 %
Platelets: 164 10*3/uL (ref 150–400)
RBC: 4.09 MIL/uL — ABNORMAL LOW (ref 4.22–5.81)
RDW: 15.9 % — ABNORMAL HIGH (ref 11.5–15.5)
WBC: 6.6 10*3/uL (ref 4.0–10.5)
nRBC: 0 % (ref 0.0–0.2)

## 2019-02-03 MED ORDER — AMLODIPINE BESYLATE 5 MG PO TABS: 5.0000 mg | ORAL_TABLET | Freq: Every day | ORAL | 0 refills | Status: DC

## 2019-02-03 MED ORDER — AMLODIPINE BESYLATE 5 MG PO TABS
5.0000 mg | ORAL_TABLET | Freq: Once | ORAL | Status: AC
  Administered 2019-02-03: 5 mg via ORAL
  Filled 2019-02-03: qty 1

## 2019-02-03 NOTE — ED Provider Notes (Signed)
[facility] EMERGENCY DEPARTMENT Provider Note   CSN: [id number] Arrival date & time: 02/03/19  1714     History Chief Complaint  Patient presents with  . Hypertension    Curtis Weiss is a 73 y.o. male.  HPI      Had CABG October 22, had MI 21st Have had difficulty controlling blood pressures, increasing over the last 3 weeks Taking medication as prescribed Nurse neighbor checked BP and said it was 280/140, machine was reading 200s Losartan was on 25mg  12/21, if BP doesn't improve go up, and it is still high 100mg  for 10-12 days, and metoprolol taking 12.5 instead of 25  Normal HR was 45-50 prior  No numbness, weakness, trouble talking, trouble walking, no visual changes Every once in a while will have slight headache but none today No CP or shortness of breath, some incision discomfort    Past Medical History:  Diagnosis Date  . AAA (abdominal aortic aneurysm) without rupture ([place])   . Chronic insomnia   . Herniated lumbar intervertebral disc   . Hypertension   . Idiopathic peripheral neuropathy   . Pure hypercholesterolemia     Patient Active Problem List   Diagnosis Date Noted  . 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 Coronary artery disease 11/30/2018  . Unstable angina ([place]) 11/26/2018  . Bradycardia 11/26/2018  . Hypokalemia 11/26/2018  . CAD (coronary artery disease) 11/26/2018  . Hypertension   . AAA (abdominal aortic aneurysm) without rupture ([place])   . Chest pain 11/25/2018    Past Surgical History:  Procedure Laterality Date  . Arm surgery    . CLIPPING OF ATRIAL APPENDAGE N/A 11/30/2018   Procedure: Clipping Of Atrial Appendage - using AtriCure clip size 7mm;  Surgeon: Wonda Olds, MD;  Location: [facility];  Service: Open Heart Surgery;  Laterality: N/A;  . CORONARY ARTERY BYPASS GRAFT N/A 11/30/2018   Procedure: CORONARY ARTERY BYPASS GRAFTING (CABG) x three, using bilateral mammary arteries and right leg greater saphenous vein harvested  endoscopically;  Surgeon: Wonda Olds, MD;  Location: [place];  Service: Open Heart Surgery;  Laterality: N/A;  . LEFT HEART CATH AND CORONARY ANGIOGRAPHY N/A 11/26/2018   Procedure: LEFT HEART CATH AND CORONARY ANGIOGRAPHY;  Surgeon: Leonie Man, MD;  Location: [place] CV LAB;  Service: Cardiovascular;  Laterality: N/A;  . SHOULDER SURGERY    . TEE WITHOUT CARDIOVERSION N/A 11/30/2018   Procedure: TRANSESOPHAGEAL ECHOCARDIOGRAM (TEE);  Surgeon: Wonda Olds, MD;  Location: [place];  Service: Open Heart Surgery;  Laterality: N/A;       Family History  Problem Relation Age of Onset  . Hypertension Mother   . Hypertension Father     Social History   Tobacco Use  . Smoking status: Former Research scientist (life sciences)  . Smokeless tobacco: Never Used  Substance Use Topics  . Alcohol use: Yes    Comment: 14  . Drug use: Not on file    Home Medications Prior to Admission medications   Medication Sig Start Date End Date Taking? Authorizing Provider  amLODipine (NORVASC) 5 MG tablet Take 1 tablet (5 mg total) by mouth daily. 02/03/19   Gareth Morgan, MD  aspirin EC 81 MG tablet Take 81 mg by mouth daily.    [provider]  atorvastatin (LIPITOR) 80 MG tablet Take 1 tablet (80 mg total) by mouth daily at 6 PM. 12/29/18   Lendon Colonel, NP  clopidogrel (PLAVIX) 75 MG tablet Take 1 tablet (75  mg total) by mouth daily. 12/29/18   Lendon Colonel, NP  losartan (COZAAR) 50 MG tablet Take 1 tablet (50 mg total) by mouth daily. 01/24/19 04/24/19  Lelon Perla, MD  metoprolol tartrate (LOPRESSOR) 25 MG tablet Take 0.5 tablets (12.5 mg total) by mouth 2 (two) times daily. 12/10/18 12/10/19  Wonda Olds, MD  Multiple Vitamins-Minerals (MULTIVITAMIN PO) Take 1 tablet by mouth daily.    [provider]    Allergies    Patient has no known allergies.  Review of Systems   Review of Systems  Constitutional: Negative for fever.  HENT: Negative for sore throat.     Eyes: Negative for visual disturbance.  Respiratory: Negative for cough and shortness of breath.   Cardiovascular: Negative for chest pain.  Gastrointestinal: Negative for abdominal pain, diarrhea, nausea and vomiting.  Genitourinary: Negative for difficulty urinating.  Musculoskeletal: Negative for back pain and neck stiffness.  Skin: Negative for rash.  Neurological: Negative for dizziness, syncope, facial asymmetry, speech difficulty, weakness, numbness and headaches.    Physical Exam Updated Vital Signs BP (!) 183/90 (BP Location: Right Arm)   Pulse 62   Temp 98.1 F (36.7 C) (Oral)   Resp 16   SpO2 98%   Physical Exam Vitals and nursing note reviewed.  Constitutional:      General: He is not in acute distress.    Appearance: He is well-developed. He is not diaphoretic.  HENT:     Head: Normocephalic and atraumatic.  Eyes:     Conjunctiva/sclera: Conjunctivae normal.  Cardiovascular:     Rate and Rhythm: Normal rate and regular rhythm.     Heart sounds: Normal heart sounds. No murmur. No friction rub. No gallop.   Pulmonary:     Effort: Pulmonary effort is normal. No respiratory distress.     Breath sounds: Normal breath sounds. No wheezing or rales.  Abdominal:     General: There is no distension.     Palpations: Abdomen is soft.     Tenderness: There is no abdominal tenderness. There is no guarding.  Musculoskeletal:     Cervical back: Normal range of motion.  Skin:    General: Skin is warm and dry.  Neurological:     Mental Status: He is alert and oriented to person, place, and time.     GCS: GCS eye subscore is 4. GCS verbal subscore is 5. GCS motor subscore is 6.     Cranial Nerves: Cranial nerves are intact. No dysarthria or facial asymmetry.     Sensory: Sensation is intact. No sensory deficit.     Motor: Motor function is intact. No weakness or pronator drift.     ED Results / Procedures / Treatments   Labs (all labs ordered are listed, but only  abnormal results are displayed) Labs Reviewed  CBC WITH DIFFERENTIAL/PLATELET - Abnormal; Notable for the following components:      Result Value   RBC 4.09 (*)    Hemoglobin 12.4 (*)    HCT 38.8 (*)    RDW 15.9 (*)    All other components within normal limits  COMPREHENSIVE METABOLIC PANEL - Abnormal; Notable for the following components:   Glucose, Bld 126 (*)    All other components within normal limits    EKG None  Radiology No results found.  Procedures Procedures (including critical care time)  Medications Ordered in ED Medications  amLODipine (NORVASC) tablet 5 mg (5 mg Oral Given 02/03/19 1747)    ED  Course  I have reviewed the triage vital signs and the nursing notes.  Pertinent labs & imaging results that were available during my care of the patient were reviewed by me and considered in my medical decision making (see chart for details).    MDM Rules/Calculators/A&P                      73yo male with history above including hypertension and CABG in October presents with concern for hypertension.   On arrival to the ED, BP was 231/90.  Patient without headache, no neurologic symptoms, no chest pain, no shortness of breath and have low suspicion for hypertensive emergencies including low suspicion for [facility], hypertensive encephalopathy, stroke, MI, aortic dissection, pulmonary edema.  BMP was checked showing normal renal function. Provided patient with dose of amlodipine, BP improved to 183/90.  Was previously on 4 antihypertensives including amlodipine however after CABG was taken off of medications and now on losartan and metoprolol. WIll add on amlodipine 5mg  . Scheduled to see Cardiology on Tuesday for follow up. Discussed reasons to return. Patient discharged in stable condition with understanding of reasons to return.    Final Clinical Impression(s) / ED Diagnoses Final diagnoses:  Essential hypertension    Rx / DC Orders ED Discharge Orders         Ordered      amLODipine (NORVASC) 5 MG tablet  Daily     02/03/19 1937           Gareth Morgan, MD 02/04/19 1348

## 2019-02-03 NOTE — Telephone Encounter (Signed)
LVM

## 2019-02-03 NOTE — ED Triage Notes (Signed)
Pt states he checks BP daily-has been increasing x 2 weeks-cards changed BP meds this month after CABG-pt denies CP-NAD-steady gait

## 2019-02-03 NOTE — Telephone Encounter (Signed)
Spoke to pt. States his BP's have been high. 172//68, 175/89, 187/87, 195/98. 191/101. Pt is symptomatic. Asked pt if they were taking their metoprolol, agreed. Made appt with Coletta Memos for 02/08/19. Will forward to [facility] for review

## 2019-02-03 NOTE — Telephone Encounter (Signed)
Patient states he is returning Kayla's call. See MyChart note.

## 2019-02-03 NOTE — ED Notes (Signed)
Case discussed with EDP-orders received 

## 2019-02-04 ENCOUNTER — Telehealth: Payer: Self-pay | Admitting: Student

## 2019-02-04 NOTE — Telephone Encounter (Signed)
    The patient called the after hours line due to elevated BP. BP was elevated to 280/135 yesterday which prompted ED evaluation and he was started on Amlodipine 5mg  daily. SBP was in the 180's upon discharge but has been elevated today, at 210/100 when checked earlier. He reports currently taking Losartan 100mg  daily, Lopressor 12.5mg  BID (issues with bradycardia in the past) and Amlodipine 5mg  daily (just started yesterday). He reports a headache but denies any chest pain, neurological deficits or vision changes.   I advised he take an extra 5mg  of Amlodipine now and continue with Amlodipine 5mg  BID over the weekend. He has a scheduled follow-up visit on Tuesday. He will continue to monitor BP and we reviewed warning signs to monitor for which he should proceed back to the ED.   He voiced understanding of the above information and was appreciative of the call.   Signed, Erma Heritage, PA-C 02/04/2019, 7:46 PM Pager: [phone]

## 2019-02-05 NOTE — Telephone Encounter (Signed)
Amlodipine added in ER; follow BP and will increase meds as needed Kirk Ruths

## 2019-02-07 NOTE — Progress Notes (Addendum)
Cardiology Clinic Note   Patient Name: Curtis Weiss Date of Encounter: 02/08/2019  Primary Care Provider:  Drake Leach, MD Primary Cardiologist:  Kirk Ruths, MD  Patient Profile    Curtis Weiss 74 year old male presents today for follow-up of his hypertension and hypercholesterolemia.  Past Medical History    Past Medical History:  Diagnosis Date  . AAA (abdominal aortic aneurysm) without rupture ([place])   . Chronic insomnia   . Herniated lumbar intervertebral disc   . Hypertension   . Idiopathic peripheral neuropathy   . Pure hypercholesterolemia    Past Surgical History:  Procedure Laterality Date  . Arm surgery    . CLIPPING OF ATRIAL APPENDAGE N/A 11/30/2018   Procedure: Clipping Of Atrial Appendage - using AtriCure clip size 45mm;  Surgeon: Wonda Olds, MD;  Location: [facility];  Service: Open Heart Surgery;  Laterality: N/A;  . CORONARY ARTERY BYPASS GRAFT N/A 11/30/2018   Procedure: CORONARY ARTERY BYPASS GRAFTING (CABG) x three, using bilateral mammary arteries and right leg greater saphenous vein harvested endoscopically;  Surgeon: Wonda Olds, MD;  Location: [facility];  Service: Open Heart Surgery;  Laterality: N/A;  . LEFT HEART CATH AND CORONARY ANGIOGRAPHY N/A 11/26/2018   Procedure: LEFT HEART CATH AND CORONARY ANGIOGRAPHY;  Surgeon: Leonie Man, MD;  Location: [facility];  Service: Cardiovascular;  Laterality: N/A;  . SHOULDER SURGERY    . TEE WITHOUT CARDIOVERSION N/A 11/30/2018   Procedure: TRANSESOPHAGEAL ECHOCARDIOGRAM (TEE);  Surgeon: Wonda Olds, MD;  Location: [facility];  Service: Open Heart Surgery;  Laterality: N/A;    Allergies  No Known Allergies  History of Present Illness    Mr. Boies has a PMH of chronic insomnia, hypertension, idiopathic peripheral neuropathy, and hypercholesterolemia.  Outside health records show no renal artery stenosis from previous renal Doppler study and normal catecholamines and cortisol  levels.  And abdominal ultrasound 10/2018 showed 3.3 cm abdominal aortic aneurysm.  There was also greater than 50% stenosis in the right common iliac artery.  An echocardiogram on 10/2018 showed normal LV function, moderate left atrial enlargement, mild mitral and tricuspid regurgitation.  A cardiac catheterization 11/2018 showed significant LAD and RCA stenosis.  His carotid Dopplers 11/2018 showed 1 to 39% bilateral stenosis.  He underwent CABG x3 11/2018 LIMA to LAD, RIMA to PDA and SVG to diagonal.  He was last seen by Dr. Stanford Breed on 01/24/2019.  During that time blood pressure was elevated in the 0000000 range systolic.  His losartan was increased at that time.  On 02/04/2019 he contacted after hours triage line due to elevated blood pressure.  280/135 on the prior day and he presents to the emergency department where he was started on amlodipine 5 mg daily (02/03/2019).  Bernerd Pho, PA-C spoke with him and advised him to take 5 mg amlodipine twice daily and follow-up in the clinic.  He presents the clinic today and states he continues to walk 2 miles daily.  He states his blood pressure has been elevated since the first part of December.  Initially his blood pressure was 158/76 in clinic today on recheck it was 138/76.  He states his diet changed some during the holidays, eating green bean casserole, prime rib, and mashed potatoes.  I will stop his losartan and start irbesartan, give salty 6, and repeat labs in am 2 weeks.  He does have some chest discomfort today but states it is more soreness due to his coughing in the  setting of postnasal drip.  He denies chest pain, shortness of breath, lower extremity edema, fatigue, palpitations, melena, hematuria, hemoptysis, diaphoresis, weakness, presyncope, syncope, orthopnea, and PND.   Home Medications    Prior to Admission medications   Medication Sig Start Date End Date Taking? Authorizing Provider  amLODipine (NORVASC) 5 MG tablet Take 1 tablet (5  mg total) by mouth daily. 02/03/19   Gareth Morgan, MD  aspirin EC 81 MG tablet Take 81 mg by mouth daily.    [provider]  atorvastatin (LIPITOR) 80 MG tablet Take 1 tablet (80 mg total) by mouth daily at 6 PM. 12/29/18   Lendon Colonel, NP  clopidogrel (PLAVIX) 75 MG tablet Take 1 tablet (75 mg total) by mouth daily. 12/29/18   Lendon Colonel, NP  losartan (COZAAR) 50 MG tablet Take 1 tablet (50 mg total) by mouth daily. 01/24/19 04/24/19  Lelon Perla, MD  metoprolol tartrate (LOPRESSOR) 25 MG tablet Take 0.5 tablets (12.5 mg total) by mouth 2 (two) times daily. 12/10/18 12/10/19  Wonda Olds, MD  Multiple Vitamins-Minerals (MULTIVITAMIN PO) Take 1 tablet by mouth daily.    [provider]    Family History    Family History  Problem Relation Age of Onset  . Hypertension Mother   . Hypertension Father    He indicated that his mother is deceased. He indicated that his father is deceased. He indicated that his maternal grandmother is deceased. He indicated that his maternal grandfather is deceased. He indicated that his paternal grandmother is deceased. He indicated that his paternal grandfather is deceased.  Social History    Social History   Socioeconomic History  . Marital status: Married    Spouse name: Not on file  . Number of children: 3  . Years of education: Not on file  . Highest education level: Not on file  Occupational History    Comment: Retired  Tobacco Use  . Smoking status: Former Research scientist (life sciences)  . Smokeless tobacco: Never Used  Substance and Sexual Activity  . Alcohol use: Yes    Comment: 14  . Drug use: Not on file  . Sexual activity: Not on file  Other Topics Concern  . Not on file  Social History Narrative  . Not on file   Social Determinants of Health   Financial Resource Strain:   . Difficulty of Paying Living Expenses: Not on file  Food Insecurity:   . Worried About Charity fundraiser in the Last Year: Not on  file  . Ran Out of Food in the Last Year: Not on file  Transportation Needs:   . Lack of Transportation (Medical): Not on file  . Lack of Transportation (Non-Medical): Not on file  Physical Activity:   . Days of Exercise per Week: Not on file  . Minutes of Exercise per Session: Not on file  Stress: No Stress Concern Present  . Feeling of Stress : Not at all  Social Connections:   . Frequency of Communication with Friends and Family: Not on file  . Frequency of Social Gatherings with Friends and Family: Not on file  . Attends Religious Services: Not on file  . Active Member of Clubs or Organizations: Not on file  . Attends Archivist Meetings: Not on file  . Marital Status: Not on file  Intimate Partner Violence:   . Fear of Current or Ex-Partner: Not on file  . Emotionally Abused: Not on file  . Physically Abused: Not  on file  . Sexually Abused: Not on file     Review of Systems    General:  No chills, fever, night sweats or weight changes.  Cardiovascular:  No chest pain, dyspnea on exertion, edema, orthopnea, palpitations, paroxysmal nocturnal dyspnea. Dermatological: No rash, lesions/masses Respiratory: No cough, dyspnea Urologic: No hematuria, dysuria Abdominal:   No nausea, vomiting, diarrhea, bright red blood per rectum, melena, or hematemesis Neurologic:  No visual changes, wkns, changes in mental status. All other systems reviewed and are otherwise negative except as noted above.  Physical Exam    VS:  BP 138/76 (BP Location: Left Arm, Patient Position: Sitting, Cuff Size: Normal)   Pulse 67   Temp 97.6 F (36.4 C)   Ht 6' (1.829 m)   Wt 208 lb (94.3 kg)   SpO2 97%   BMI 28.21 kg/m  , BMI Body mass index is 28.21 kg/m. GEN: Well nourished, well developed, in no acute distress. HEENT: normal. Neck: Supple, no JVD, carotid bruits, or masses. Cardiac: RRR, no murmurs, rubs, or gallops. No clubbing, cyanosis, edema.  Radials/DP/PT 2+ and equal  bilaterally.  Respiratory:  Respirations regular and unlabored, clear to auscultation bilaterally. GI: Soft, nontender, nondistended, BS + x 4. MS: no deformity or atrophy. Skin: warm and dry, no rash. Neuro:  Strength and sensation are intact. Psych: Normal affect.  Accessory Clinical Findings    ECG personally reviewed by me today-none today EKG 12/17/2018 Normal sinus rhythm 76 bpm   EKG 10/20/2018 sinus bradycardia 49 bpm- No acute changes  EKG 02/12/2016 Sinus bradycardia 49 bpm  Cardiac catheterization 11/26/2018  Ost LAD to Prox LAD lesion is 60% stenosed. Prox LAD to Mid LAD lesion is 50% stenosed.  Mid LAD lesion is 90% stenosed with 95% stenosed side branch in 1st Diag. Hazy globular calcified lesion at bifurcation  Mid LAD to Dist LAD lesion is 35% stenosed. Mid Cx lesion is 35% stenosed. Not significant  Proximal RCA: Prox RCA-1 lesion is 70% stenosed -tapering to Prox RCA-2 lesion is 90% stenosed, follwed by Prox RCA-3 lesion is 50% stenosed.  Mid RCA lesion is 99% stenosed with 90% stenosed side branch in Acute Mrg. Hazy globular calcified lesion at branch point  The left ventricular systolic function is normal.  LV end diastolic pressure is normal.  The left ventricular ejection fraction is 55-65% by visual estimate.  There is no aortic valve stenosis.   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  Moderate LCx-OM 30%  Well preserved LVEF with normal LVEDP.  Echocardiogram 10/26/2018 IMPRESSIONS    1. Left ventricular ejection  fraction, by visual estimation, is 60 to 65%. The left ventricle has normal function. Normal left ventricular size. There is mildly increased left ventricular hypertrophy.  2. Left ventricular diastolic Doppler parameters are consistent with impaired relaxation pattern of LV diastolic filling.  3. Global right ventricle has normal systolic function.The right ventricular size is normal. No increase in right ventricular wall thickness.  4. Left atrial size was moderately dilated.  5. Right atrial size was normal.  6. The mitral valve is normal in structure. Mild mitral  valve regurgitation. No evidence of mitral stenosis.  7. The tricuspid valve is normal in structure. Tricuspid valve regurgitation is mild.  8. The aortic valve is normal in structure. Aortic valve regurgitation was not visualized by color flow Doppler. Mild to moderate aortic valve sclerosis/calcification without any evidence of aortic stenosis.  9. The pulmonic valve was normal in structure. Pulmonic valve regurgitation is not visualized by color flow Doppler. 10. Mildly elevated pulmonary artery systolic pressure. 11. The inferior vena cava is normal in size with greater than 50% respiratory variability, suggesting right atrial pressure of 3 mmHg. 12. The average left ventricular global longitudinal strain is -18.3 %.    Assessment & Plan   1. Essential hypertension-BP today 138/76 Continue amlodipine 5 mg tablet twice daily Continue metoprolol 100 mg tablet twice daily Start irbesartan 150 daily Stop losartan  Follow-up AAA duplex reviewed with Dr. Stanford Breed- Keep one year FU [place], September 2021. Heart healthy low-sodium diet-salty 6 given Maintain physical activity   Hyperlipidemia-11/26/2018: Cholesterol 213; HDL 31; LDL Cholesterol 131; Triglycerides 254; VLDL 51 Continue statin Heart healthy low-sodium high-fiber diet-salty 6 given Increase physical activity as tolerated   Abdominal aortic aneurysm-measuring 3.2  cm. Follow-up AAA duplex reviewed with Dr. Stanford Breed- Keep one year FU [place], September 2021. Repeat AAA duplex 10/2019  Disposition: Follow-up with me in 2 weeks.  Jossie Ng. [facility] [place] [address] Office [phone] Fax [phone]

## 2019-02-08 ENCOUNTER — Encounter: Payer: Self-pay | Admitting: General Practice

## 2019-02-08 ENCOUNTER — Other Ambulatory Visit: Payer: Self-pay

## 2019-02-08 ENCOUNTER — Ambulatory Visit: Payer: Medicare HMO | Admitting: General Practice

## 2019-02-08 VITALS — BP 138/76 | HR 67 | Temp 97.6°F | Ht 72.0 in | Wt 208.0 lb

## 2019-02-08 DIAGNOSIS — I10 Essential (primary) hypertension: Secondary | ICD-10-CM

## 2019-02-08 DIAGNOSIS — E7800 Pure hypercholesterolemia, unspecified: Secondary | ICD-10-CM | POA: Diagnosis not present

## 2019-02-08 DIAGNOSIS — I714 Abdominal aortic aneurysm, without rupture, unspecified: Secondary | ICD-10-CM

## 2019-02-08 DIAGNOSIS — Z79899 Other long term (current) drug therapy: Secondary | ICD-10-CM | POA: Diagnosis not present

## 2019-02-08 MED ORDER — AMLODIPINE BESYLATE 10 MG PO TABS: 10.0000 mg | ORAL_TABLET | Freq: Every day | ORAL | 11 refills | Status: DC

## 2019-02-08 MED ORDER — IRBESARTAN 150 MG PO TABS: 150.0000 mg | ORAL_TABLET | Freq: Every day | ORAL | 1 refills | Status: DC

## 2019-02-08 NOTE — Patient Instructions (Signed)
Medication Instructions:  STOP LOSARTAN  IRBESARTAN 150MG  DAILY  If you need a refill on your cardiac medications before your next appointment, please call your pharmacy.  Labwork: FASTING LIPID, BMET AND LFT-IN 2 WEEKS(BEFORE FOLLOW UP APPT) 02-18-19 HERE IN OUR OFFICE AT [facility]    You will need to fast. DO NOT EAT OR DRINK PAST MIDNIGHT.      If you have labs (blood work) drawn today and your tests are completely normal, you will receive your results only by: Marland Kitchen MyChart Message (if you have MyChart) OR . A paper copy in the mail If you have any lab test that is abnormal or we need to change your treatment, we will call you to review the results.  Special Instructions: PLEASE READ AND FOLLOW SALTY 6 ATTACHED  Reduce your risk of getting COVID-19 With your heart disease it is especially important for people at increased risk of severe illness from COVID-19, and those who live with them, to protect themselves from getting COVID-19. The best way to protect yourself and to help reduce the spread of the virus that causes COVID-19 is to: Marland Kitchen Limit your interactions with other people as much as possible. . Take precautions to prevent getting COVID-19 when you do interact with others. If you start feeling sick and think you may have COVID-19, get in touch with your healthcare provider within 24 hours.  Follow-Up: IN 2 WEEKS WITH Coletta Memos, FNP In Person You may see Kirk Ruths, MD or one of the following Advanced Practice Providers on your designated Care Team:  Kerin Ransom, PA-C  [place], [place].    At [facility], you and your health needs are our priority.  As part of our continuing mission to provide you with exceptional heart care, we have created designated Provider Care Teams.  These Care Teams include your primary Cardiologist (physician) and Advanced Practice Providers (APPs -  Physician Assistants and Nurse Practitioners) who all work together to provide you with the care  you need, when you need it.  Thank you for choosing [facility] at [facility]!!

## 2019-02-08 NOTE — Progress Notes (Signed)
Repeat abd ultrasound 1 year Kirk Ruths

## 2019-02-09 MED ORDER — IRBESARTAN 150 MG PO TABS: 150.0000 mg | ORAL_TABLET | Freq: Every day | ORAL | 1 refills | Status: DC

## 2019-02-09 NOTE — Addendum Note (Signed)
Addended by: Deberah Pelton on: 02/09/2019 11:43 AM   Modules accepted: Orders

## 2019-02-17 DIAGNOSIS — E7800 Pure hypercholesterolemia, unspecified: Secondary | ICD-10-CM | POA: Diagnosis not present

## 2019-02-17 DIAGNOSIS — I2510 Atherosclerotic heart disease of native coronary artery without angina pectoris: Secondary | ICD-10-CM | POA: Diagnosis not present

## 2019-02-18 ENCOUNTER — Telehealth: Payer: Self-pay | Admitting: *Deleted

## 2019-02-18 ENCOUNTER — Other Ambulatory Visit: Payer: Self-pay | Admitting: *Deleted

## 2019-02-18 ENCOUNTER — Telehealth: Payer: Self-pay

## 2019-02-18 DIAGNOSIS — R748 Abnormal levels of other serum enzymes: Secondary | ICD-10-CM

## 2019-02-18 DIAGNOSIS — E7800 Pure hypercholesterolemia, unspecified: Secondary | ICD-10-CM

## 2019-02-18 LAB — LIPID PANEL
Chol/HDL Ratio: 3.9 ratio (ref 0.0–5.0)
Cholesterol, Total: 149 mg/dL (ref 100–199)
HDL: 38 mg/dL — ABNORMAL LOW (ref >39)
LDL Chol Calc (NIH): 91 mg/dL (ref 0–99)
Triglycerides: 111 mg/dL (ref 0–149)
VLDL Cholesterol Cal: 20 mg/dL (ref 5–40)

## 2019-02-18 LAB — HEPATIC FUNCTION PANEL
ALT: 30 IU/L (ref 0–44)
AST: 29 IU/L (ref 0–40)
Albumin: 4.3 g/dL (ref 3.7–4.7)
Alkaline Phosphatase: 125 IU/L — ABNORMAL HIGH (ref 39–117)
Bilirubin Total: 0.6 mg/dL (ref 0.0–1.2)
Bilirubin, Direct: 0.18 mg/dL (ref 0.00–0.40)
Total Protein: 7.2 g/dL (ref 6.0–8.5)

## 2019-02-18 LAB — BASIC METABOLIC PANEL
BUN/Creatinine Ratio: 12 (ref 10–24)
BUN: 11 mg/dL (ref 8–27)
CO2: 24 mmol/L (ref 20–29)
Calcium: 9.2 mg/dL (ref 8.6–10.2)
Chloride: 106 mmol/L (ref 96–106)
Creatinine, Ser: 0.89 mg/dL (ref 0.76–1.27)
GFR calc Af Amer: 98 mL/min/{1.73_m2} (ref >59)
GFR calc non Af Amer: 85 mL/min/{1.73_m2} (ref >59)
Glucose: 103 mg/dL — ABNORMAL HIGH (ref 65–99)
Potassium: 4.3 mmol/L (ref 3.5–5.2)
Sodium: 145 mmol/L — ABNORMAL HIGH (ref 134–144)

## 2019-02-18 MED ORDER — EZETIMIBE 10 MG PO TABS: 10.0000 mg | ORAL_TABLET | Freq: Every day | ORAL | 3 refills | Status: DC

## 2019-02-18 NOTE — Telephone Encounter (Signed)
Results released to my chart  °New script sent to the pharmacy  °Lab orders mailed to the pt  °

## 2019-02-18 NOTE — Telephone Encounter (Signed)
Left message for patient to call back as we need to move his appointment on Tuesday 1/19 from [facility] 9:30am onsite to [facility] onsite schedule.

## 2019-02-18 NOTE — Telephone Encounter (Signed)
-----  Message from Lelon Perla, MD sent at 02/18/2019  7:28 AM EST ----- Add zetia 10 mg daily; lipids, liver, GGT and 5' alk phos 12 weeks Kirk Ruths

## 2019-02-18 NOTE — Progress Notes (Unsigned)
ggt

## 2019-02-21 NOTE — Telephone Encounter (Signed)
Called patient again to setup another appointment time for the same day. No answer left message to call back.

## 2019-02-22 ENCOUNTER — Other Ambulatory Visit: Payer: Self-pay

## 2019-02-22 ENCOUNTER — Ambulatory Visit: Payer: Medicare HMO | Admitting: Physician Assistant

## 2019-02-22 ENCOUNTER — Encounter: Payer: Self-pay | Admitting: Physician Assistant

## 2019-02-22 ENCOUNTER — Ambulatory Visit: Payer: Medicare HMO | Admitting: General Practice

## 2019-02-22 VITALS — BP 161/79 | HR 92 | Temp 97.9°F | Ht 72.0 in | Wt 211.4 lb

## 2019-02-22 DIAGNOSIS — I714 Abdominal aortic aneurysm, without rupture, unspecified: Secondary | ICD-10-CM

## 2019-02-22 DIAGNOSIS — E7800 Pure hypercholesterolemia, unspecified: Secondary | ICD-10-CM | POA: Diagnosis not present

## 2019-02-22 DIAGNOSIS — I10 Essential (primary) hypertension: Secondary | ICD-10-CM | POA: Diagnosis not present

## 2019-02-22 DIAGNOSIS — Z79899 Other long term (current) drug therapy: Secondary | ICD-10-CM | POA: Diagnosis not present

## 2019-02-22 DIAGNOSIS — I25110 Atherosclerotic heart disease of native coronary artery with unstable angina pectoris: Secondary | ICD-10-CM

## 2019-02-22 DIAGNOSIS — Z951 Presence of aortocoronary bypass graft: Secondary | ICD-10-CM | POA: Diagnosis not present

## 2019-02-22 MED ORDER — IRBESARTAN 300 MG PO TABS: 300.0000 mg | ORAL_TABLET | Freq: Every day | ORAL | 3 refills | Status: DC

## 2019-02-22 NOTE — Progress Notes (Signed)
Cardiology Office Note:    Date:  02/22/2019   ID:  Curtis Weiss, DOB 1945-02-04, MRN [id number]  PCP:  Drake Leach, MD  Cardiologist:  Kirk Ruths, MD   Referring MD: Drake Leach, MD   Chief Complaint  Patient presents with  . Follow-up    hypertension    History of Present Illness:    Curtis Weiss is a 74 y.o. male with hypertension, hyperlipidemia, AAA (3.2 cm). Hx of normal renal artery dopplers, normal catecholamines and cortisol levels. Pt was seen by Dr. Stanford Breed via telemedicine appt for DOE x 6 months, and one episode of sudden onset bilateral upper extremity pain. He was scheduled for an in-office visit for an EKG.  He had follow up echo and AAA [place]. Echo on 10/26/18 with normal EF, diastolic dysfunction, and no wall motion abnormality mentioned, but reduced GLS mentioned. Abdominal US showed AAA 3.2 cm and annual screening was recommended.   He presented to [facility] with symptoms concerning for angina. He underwent angiography 10/23/230 which revealed severe multi vessel disease and recommended TCTS consult. He underwent CABG x3 (LIMA-LAD, RIMA-PDA, SVG-diagonal branch, 11/30/18).  At discharge, the following medications were discontinued: Amlodipine, doxazosin, quinapril, and metoprolol (for bradycardia in the 40s).  He was discharged on aspirin 81 mg, atorvastatin 80 mg, Plavix 75 mg, and lisinopril 20 mg. For unclear reasons, he was also discharged on amiodarone. He is no longer taking this.   Patient was seen in the ER on 02/03/2019 for hypertensive urgency with a presenting pressure of 231/90.  Increased amlodipine from 5 mg to 10 mg in the ER. Since then, lisinopril was changed to irbesartan 150 mg.    He presents today for BP follow-up. He has been having headaches and blurry vision. He is voiding well. His BP log has been elevated but he is taking his blood pressure 5 min after taking his medications. We discussed keeping a log 2 hr after medications. Nevertheless,  he is hypertensive here today. Otherwise, he is recovering well from his CABG.    Past Medical History:  Diagnosis Date  . AAA (abdominal aortic aneurysm) without rupture ([place])   . Chronic insomnia   . Herniated lumbar intervertebral disc   . Hypertension   . Idiopathic peripheral neuropathy   . Pure hypercholesterolemia     Past Surgical History:  Procedure Laterality Date  . Arm surgery    . CLIPPING OF ATRIAL APPENDAGE N/A 11/30/2018   Procedure: Clipping Of Atrial Appendage - using AtriCure clip size 70mm;  Surgeon: Wonda Olds, MD;  Location: [facility] OR;  Service: Open Heart Surgery;  Laterality: N/A;  . CORONARY ARTERY BYPASS GRAFT N/A 11/30/2018   Procedure: CORONARY ARTERY BYPASS GRAFTING (CABG) x three, using bilateral mammary arteries and right leg greater saphenous vein harvested endoscopically;  Surgeon: Wonda Olds, MD;  Location: [place];  Service: Open Heart Surgery;  Laterality: N/A;  . LEFT HEART CATH AND CORONARY ANGIOGRAPHY N/A 11/26/2018   Procedure: LEFT HEART CATH AND CORONARY ANGIOGRAPHY;  Surgeon: Leonie Man, MD;  Location: [place] [facility];  Service: Cardiovascular;  Laterality: N/A;  . SHOULDER SURGERY    . TEE WITHOUT CARDIOVERSION N/A 11/30/2018   Procedure: TRANSESOPHAGEAL ECHOCARDIOGRAM (TEE);  Surgeon: Wonda Olds, MD;  Location: [place];  Service: Open Heart Surgery;  Laterality: N/A;    Current Medications: No outpatient medications have been marked as taking for the 02/22/19 encounter (Office Visit) with Ledora Bottcher, [place].  Allergies:   Patient has no known allergies.   Social History   Socioeconomic History  . Marital status: Married    Spouse name: Not on file  . Number of children: 3  . Years of education: Not on file  . Highest education level: Not on file  Occupational History    Comment: Retired  Tobacco Use  . Smoking status: Former Research scientist (life sciences)  . Smokeless tobacco: Never Used  Substance and Sexual Activity  .  Alcohol use: Yes    Comment: 14  . Drug use: Not on file  . Sexual activity: Not on file  Other Topics Concern  . Not on file  Social History Narrative  . Not on file   Social Determinants of Health   Financial Resource Strain:   . Difficulty of Paying Living Expenses: Not on file  Food Insecurity:   . Worried About Charity fundraiser in the Last Year: Not on file  . Ran Out of Food in the Last Year: Not on file  Transportation Needs:   . Lack of Transportation (Medical): Not on file  . Lack of Transportation (Non-Medical): Not on file  Physical Activity:   . Days of Exercise per Week: Not on file  . Minutes of Exercise per Session: Not on file  Stress: No Stress Concern Present  . Feeling of Stress : Not at all  Social Connections:   . Frequency of Communication with Friends and Family: Not on file  . Frequency of Social Gatherings with Friends and Family: Not on file  . Attends Religious Services: Not on file  . Active Member of Clubs or Organizations: Not on file  . Attends Archivist Meetings: Not on file  . Marital Status: Not on file     Family History: The patient's family history includes Hypertension in his father and mother.  ROS:   Please see the history of present illness.     All other systems reviewed and are negative.  EKGs/Labs/Other Studies Reviewed:    The following studies were reviewed today:  Cardiac Cath 11/26/2018 Conclusion    Ost LAD to Prox LAD lesion is 60% stenosed. Prox LAD to Mid LAD lesion is 50% stenosed.  Mid LAD lesion is 90% stenosed with 95% stenosed side branch in 1st Diag. Hazy globular calcified lesion at bifurcation  Mid LAD to Dist LAD lesion is 35% stenosed. Mid Cx lesion is 35% stenosed. Not significant  Proximal RCA: Prox RCA-1 lesion is 70% stenosed -tapering to Prox RCA-2 lesion is 90% stenosed, follwed by Prox RCA-3 lesion is 50% stenosed.  Mid RCA lesion is 99% stenosed with 90% stenosed side branch  in Acute Mrg. Hazy globular calcified lesion at branch point  The left ventricular systolic function is normal.  LV end diastolic pressure is normal.  The left ventricular ejection fraction is 55-65% by visual estimate.  There is no aortic valve stenosis.  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  Moderate LCx-OM 30%  Well preserved LVEF with normal LVEDP.  RECOMMENDATIONS  Transfer to nursing unit for post cath care TR band removal.  CVTS consultation for severe calcified three-vessel disease (not good PCI target).   Will restart IV heparin 8 hours after sheath removal.  Continue aggressive risk factor modification\     EKG:  EKG is not ordered today.    Recent Labs: 12/01/2018: Magnesium 2.5; TSH 1.839 02/03/2019: Hemoglobin 12.4; Platelets 164 02/17/2019: ALT 30; BUN 11; Creatinine, Ser 0.89; Potassium 4.3; Sodium 145  Recent Lipid Panel    Component Value Date/Time   CHOL 149 02/17/2019 1049   TRIG 111 02/17/2019 1049   HDL 38 (L) 02/17/2019 1049   CHOLHDL 3.9 02/17/2019 1049   CHOLHDL 6.9 11/26/2018 0025   VLDL 51 (H) 11/26/2018 0025   LDLCALC 91 02/17/2019 1049    Physical Exam:    VS:  BP (!) 161/79 (BP Location: Left Arm, Patient Position: Sitting, Cuff Size: Normal)   Pulse 92   Temp 97.9 F (36.6 C)   Ht 6' (1.829 m)   Wt 211 lb 6.4 oz (95.9 kg)   BMI 28.67 kg/m     Wt Readings from Last 3 Encounters:  02/22/19 211 lb 6.4 oz (95.9 kg)  02/08/19 208 lb (94.3 kg)  01/24/19 209 lb (94.8 kg)     GEN:  Well nourished, well  developed in no acute distress HEENT: Normal NECK: No JVD; No carotid bruits LYMPHATICS: No lymphadenopathy CARDIAC: RRR, no murmurs, rubs, gallops RESPIRATORY:  Clear to auscultation without rales, wheezing or rhonchi  ABDOMEN: Soft, non-tender, non-distended MUSCULOSKELETAL:  No edema; No deformity  SKIN: Warm and dry NEUROLOGIC:  Alert and oriented x 3 PSYCHIATRIC:  Normal affect   ASSESSMENT:    1. Pure hypercholesterolemia   2. Hypertension, unspecified type   3. Medication management   4. Coronary artery disease involving native coronary artery of native heart with unstable angina pectoris ([place])   5. AAA (abdominal aortic aneurysm) without rupture ([place])   6. Hx of CABG    PLAN:    In order of problems listed above:  Hypertension -Recent ER visit for hypertensive urgency -Medications currently include 10 mg amlodipine, 150 mg irbesartan, 12.5 mg Lopressor twice daily - will increase irbesartan to 300 mg - will need BMP and close follow up in 2 weeks - may need to restart doxazosin at that time - may need referral to hypertension clinic    CAD status post CABG x3 11/2018 -Continue aspirin, Plavix, 80 mg atorvastatin - no bleeding problems - no anginal symptoms   Hyperlipidemia -Previously intolerant to statins  -He is doing well on Lipitor - 11/26/2018: VLDL 51 02/17/2019: Cholesterol, Total 149; HDL 38; LDL Chol Calc (NIH) 91; Triglycerides 111 - he started zetia 2 days ago - will recheck fasting lipids in 6 weeks - may need PCSK9i in lipid clinic - we discussed the importance of LDL < 70   AAA - abdominal US showed AAA 3.2 cm on 10/26/18 - repeat 12 months   Follow up in 2 weeks.   Medication Adjustments/Labs and Tests Ordered: Current medicines are reviewed at length with the patient today.  Concerns regarding medicines are outlined above.  Orders Placed This Encounter  Procedures  . Basic metabolic panel  . Lipid panel   Meds ordered this  encounter  Medications  . irbesartan (AVAPRO) 300 MG tablet    Sig: Take 1 tablet (300 mg total) by mouth daily.    Dispense:  30 tablet    Refill:  3    Signed, Ledora Bottcher, [place]  02/22/2019 3:06 PM  [facility]

## 2019-02-22 NOTE — Patient Instructions (Signed)
Medication Instructions:  INCREASE IRBESARTAN 300MG  DAILY If you need a refill on your cardiac medications before your next appointment, please call your pharmacy.  Labwork: BMET 3 DAYS BEFORE FOLLOW UP IN 2 WEEKS HERE IN OUR OFFICE AT [facility]    You will NOT need to fast   FASTING LIPID PANEL IN 6 WEEKS(MARCH 2nd 2021)-You will need to fast. DO NOT EAT OR DRINK PAST MIDNIGHT.      If you have labs (blood work) drawn today and your tests are completely normal, you will receive your results only by: Marland Kitchen MyChart Message (if you have MyChart) OR . A paper copy in the mail If you have any lab test that is abnormal or we need to change your treatment, we will call you to review the results.  Reduce your risk of getting COVID-19 With your heart disease it is especially important for people at increased risk of severe illness from COVID-19, and those who live with them, to protect themselves from getting COVID-19. The best way to protect yourself and to help reduce the spread of the virus that causes COVID-19 is to: Marland Kitchen Limit your interactions with other people as much as possible. . Take precautions to prevent getting COVID-19 when you do interact with others. If you start feeling sick and think you may have COVID-19, get in touch with your healthcare provider within 24 hours.  Follow-Up: IN 2 WEEKS WITH [facility] OR ANGELA DUKE PA-C In Person You may see Kirk Ruths, MD  or one of the following Advanced Practice Providers on your designated Care Team:  Kerin Ransom, PA-C  [place], [place]  Coletta Memos, [place].    At [facility], you and your health needs are our priority.  As part of our continuing mission to provide you with exceptional heart care, we have created designated Provider Care Teams.  These Care Teams include your primary Cardiologist (physician) and Advanced Practice Providers (APPs -  Physician Assistants and Nurse Practitioners) who all work together to provide you with the care  you need, when you need it.  Thank you for choosing [facility] at [facility]!!

## 2019-03-01 DIAGNOSIS — R69 Illness, unspecified: Secondary | ICD-10-CM | POA: Diagnosis not present

## 2019-03-02 DIAGNOSIS — M1A0710 Idiopathic chronic gout, right ankle and foot, without tophus (tophi): Secondary | ICD-10-CM | POA: Diagnosis not present

## 2019-03-02 DIAGNOSIS — E7800 Pure hypercholesterolemia, unspecified: Secondary | ICD-10-CM | POA: Diagnosis not present

## 2019-03-02 DIAGNOSIS — R7309 Other abnormal glucose: Secondary | ICD-10-CM | POA: Diagnosis not present

## 2019-03-02 DIAGNOSIS — E041 Nontoxic single thyroid nodule: Secondary | ICD-10-CM | POA: Diagnosis not present

## 2019-03-02 DIAGNOSIS — I10 Essential (primary) hypertension: Secondary | ICD-10-CM | POA: Diagnosis not present

## 2019-03-02 DIAGNOSIS — C672 Malignant neoplasm of lateral wall of bladder: Secondary | ICD-10-CM | POA: Diagnosis not present

## 2019-03-02 DIAGNOSIS — G479 Sleep disorder, unspecified: Secondary | ICD-10-CM | POA: Diagnosis not present

## 2019-03-02 DIAGNOSIS — G609 Hereditary and idiopathic neuropathy, unspecified: Secondary | ICD-10-CM | POA: Diagnosis not present

## 2019-03-02 DIAGNOSIS — I714 Abdominal aortic aneurysm, without rupture: Secondary | ICD-10-CM | POA: Diagnosis not present

## 2019-03-03 ENCOUNTER — Other Ambulatory Visit: Payer: Self-pay | Admitting: *Deleted

## 2019-03-03 MED ORDER — HYDRALAZINE HCL 25 MG PO TABS: 25.0000 mg | ORAL_TABLET | Freq: Three times a day (TID) | ORAL | 3 refills | Status: DC

## 2019-03-07 ENCOUNTER — Encounter: Payer: Self-pay | Admitting: *Deleted

## 2019-03-08 DIAGNOSIS — E041 Nontoxic single thyroid nodule: Secondary | ICD-10-CM | POA: Diagnosis not present

## 2019-03-10 DIAGNOSIS — C672 Malignant neoplasm of lateral wall of bladder: Secondary | ICD-10-CM | POA: Diagnosis not present

## 2019-03-10 DIAGNOSIS — E041 Nontoxic single thyroid nodule: Secondary | ICD-10-CM | POA: Diagnosis not present

## 2019-03-10 DIAGNOSIS — I714 Abdominal aortic aneurysm, without rupture: Secondary | ICD-10-CM | POA: Diagnosis not present

## 2019-03-10 DIAGNOSIS — I10 Essential (primary) hypertension: Secondary | ICD-10-CM | POA: Diagnosis not present

## 2019-03-10 DIAGNOSIS — M1A0710 Idiopathic chronic gout, right ankle and foot, without tophus (tophi): Secondary | ICD-10-CM | POA: Diagnosis not present

## 2019-03-10 DIAGNOSIS — G609 Hereditary and idiopathic neuropathy, unspecified: Secondary | ICD-10-CM | POA: Diagnosis not present

## 2019-03-10 DIAGNOSIS — Z0000 Encounter for general adult medical examination without abnormal findings: Secondary | ICD-10-CM | POA: Diagnosis not present

## 2019-03-10 DIAGNOSIS — M10079 Idiopathic gout, unspecified ankle and foot: Secondary | ICD-10-CM | POA: Diagnosis not present

## 2019-03-14 ENCOUNTER — Ambulatory Visit: Payer: Medicare HMO

## 2019-03-16 DIAGNOSIS — E041 Nontoxic single thyroid nodule: Secondary | ICD-10-CM | POA: Diagnosis not present

## 2019-03-17 ENCOUNTER — Ambulatory Visit: Payer: Medicare HMO | Admitting: Physician Assistant

## 2019-03-17 DIAGNOSIS — I10 Essential (primary) hypertension: Secondary | ICD-10-CM | POA: Diagnosis not present

## 2019-03-18 ENCOUNTER — Ambulatory Visit: Payer: Medicare HMO | Admitting: Cardiology

## 2019-03-18 LAB — BASIC METABOLIC PANEL
BUN/Creatinine Ratio: 10 (ref 10–24)
BUN: 9 mg/dL (ref 8–27)
CO2: 19 mmol/L — ABNORMAL LOW (ref 20–29)
Calcium: 9.4 mg/dL (ref 8.6–10.2)
Chloride: 109 mmol/L — ABNORMAL HIGH (ref 96–106)
Creatinine, Ser: 0.89 mg/dL (ref 0.76–1.27)
GFR calc Af Amer: 98 mL/min/{1.73_m2} (ref >59)
GFR calc non Af Amer: 85 mL/min/{1.73_m2} (ref >59)
Glucose: 104 mg/dL — ABNORMAL HIGH (ref 65–99)
Potassium: 4.4 mmol/L (ref 3.5–5.2)
Sodium: 144 mmol/L (ref 134–144)

## 2019-03-18 NOTE — Progress Notes (Addendum)
Cardiology Clinic Note   Patient Name: Curtis Weiss Date of Encounter: 03/22/2019  Primary Care Provider:  Patient, No Pcp Per Primary Cardiologist:  Kirk Ruths, MD  Patient Profile    Curtis Weiss 74 year old male presents today for follow-up of his hypertension and hypercholesterolemia.  Past Medical History    Past Medical History:  Diagnosis Date   AAA (abdominal aortic aneurysm) without rupture (HCC)    Chronic insomnia    Herniated lumbar intervertebral disc    Hypertension    Idiopathic peripheral neuropathy    Pure hypercholesterolemia    Past Surgical History:  Procedure Laterality Date   Arm surgery     CLIPPING OF ATRIAL APPENDAGE N/A 11/30/2018   Procedure: Clipping Of Atrial Appendage - using AtriCure clip size 16mm;  Surgeon: Wonda Olds, MD;  Location: [facility];  Service: Open Heart Surgery;  Laterality: N/A;   CORONARY ARTERY BYPASS GRAFT N/A 11/30/2018   Procedure: CORONARY ARTERY BYPASS GRAFTING (CABG) x three, using bilateral mammary arteries and right leg greater saphenous vein harvested endoscopically;  Surgeon: Wonda Olds, MD;  Location: [place] [facility];  Service: Open Heart Surgery;  Laterality: N/A;   LEFT HEART CATH AND CORONARY ANGIOGRAPHY N/A 11/26/2018   Procedure: LEFT HEART CATH AND CORONARY ANGIOGRAPHY;  Surgeon: Leonie Man, MD;  Location: [facility];  Service: Cardiovascular;  Laterality: N/A;   SHOULDER SURGERY     TEE WITHOUT CARDIOVERSION N/A 11/30/2018   Procedure: TRANSESOPHAGEAL ECHOCARDIOGRAM (TEE);  Surgeon: Wonda Olds, MD;  Location: [place];  Service: Open Heart Surgery;  Laterality: N/A;    Allergies  No Known Allergies  History of Present Illness    Curtis Weiss has a PMH of chronic insomnia, hypertension, idiopathic peripheral neuropathy, and hypercholesterolemia.  Outside health records show no renal artery stenosis from previous renal Doppler study and normal catecholamines and cortisol  levels.  And abdominal ultrasound 10/2018 showed 3.3 cm abdominal aortic aneurysm.  There was also greater than 50% stenosis in the right common iliac artery.  An echocardiogram on 10/2018 showed normal LV function, moderate left atrial enlargement, mild mitral and tricuspid regurgitation.  A cardiac catheterization 11/2018 showed significant LAD and RCA stenosis.  His carotid Dopplers 11/2018 showed 1 to 39% bilateral stenosis.  He underwent CABG x3 11/2018 LIMA to LAD, RIMA to PDA and SVG to diagonal.  He was last seen by Dr. Stanford Breed on 01/24/2019.  During that time blood pressure was elevated in the 0000000 range systolic.  His losartan was increased at that time.  On 02/04/2019 he contacted after hours triage line due to elevated blood pressure.  280/135 on the prior day and he presents to the emergency department where he was started on amlodipine 5 mg daily (02/03/2019).  Bernerd Pho, PA-C spoke with him and advised him to take 5 mg amlodipine twice daily and follow-up in the clinic.  He presented the clinic 02/08/2019 and stated he continued to walk 2 miles daily.  He stated his blood pressure had been elevated since the first part of December.  Initially his blood pressure was 158/76 in clinic today on recheck it was 138/76.  He stated his diet changed some during the holidays.  I will stopped his losartan and started irbesartan, give salty 6, and planned to repeat labs in 2 weeks.  He did have some chest discomfort  but stated it is more soreness due to his coughing in the setting of postnasal drip.  He presented  to the clinic 02/22/2019 for follow-up evaluation with Angie Duke PA-C.  He stated he has been having headaches and blurry vision.  He was urinating well at that time.  His blood pressure log showed his blood pressures have been elevated however, he was taking his blood pressure only 5 minutes after taking his prescribed medication.  Education was provided on how to properly take blood  pressure and timing.  His irbesartan was increased to 300 mg.  He presents to the clinic today and states he presented to his PCP who decreased his amlodipine to 5 and moved his hydralazine to 50 mg twice daily.  He continues to check his blood pressure and recorded at the time that he is taking his blood pressure medication.  I have explained that he needs to wait for an hour or more after taking his blood pressure medication to get an accurate reading.  I will add chlorthalidone 25 to his medication regimen and have him follow-up with hypertension clinic.  I have instructed him to contact the cardiology office if he does not see a decrease in his blood pressure over the next 2 weeks.   He denies chest pain, shortness of breath, lower extremity edema, fatigue, palpitations, melena, hematuria, hemoptysis, diaphoresis, weakness, presyncope, syncope, orthopnea, and PND.  Home Medications    Prior to Admission medications   Medication Sig Start Date End Date Taking? Authorizing Provider  amLODipine (NORVASC) 10 MG tablet Take 1 tablet (10 mg total) by mouth daily. 02/08/19   Deberah Pelton, NP  aspirin EC 81 MG tablet Take 81 mg by mouth daily.    [provider]  atorvastatin (LIPITOR) 80 MG tablet Take 1 tablet (80 mg total) by mouth daily at 6 PM. 12/29/18   Lendon Colonel, NP  clopidogrel (PLAVIX) 75 MG tablet Take 1 tablet (75 mg total) by mouth daily. 12/29/18   Lendon Colonel, NP  ezetimibe (ZETIA) 10 MG tablet Take 1 tablet (10 mg total) by mouth daily. 02/18/19 05/19/19  Lelon Perla, MD  hydrALAZINE (APRESOLINE) 25 MG tablet Take 1 tablet (25 mg total) by mouth 3 (three) times daily. 03/03/19 06/01/19  Lelon Perla, MD  irbesartan (AVAPRO) 300 MG tablet Take 1 tablet (300 mg total) by mouth daily. 02/22/19   Deberah Pelton, NP  metoprolol tartrate (LOPRESSOR) 25 MG tablet Take 0.5 tablets (12.5 mg total) by mouth 2 (two) times daily. 12/10/18 12/10/19  Wonda Olds, MD  Multiple Vitamins-Minerals (MULTIVITAMIN PO) Take 1 tablet by mouth daily.    [provider]    Family History    Family History  Problem Relation Age of Onset   Hypertension Mother    Hypertension Father    He indicated that his mother is deceased. He indicated that his father is deceased. He indicated that his maternal grandmother is deceased. He indicated that his maternal grandfather is deceased. He indicated that his paternal grandmother is deceased. He indicated that his paternal grandfather is deceased.  Social History    Social History   Socioeconomic History   Marital status: Married    Spouse name: Not on file   Number of children: 3   Years of education: Not on file   Highest education level: Not on file  Occupational History    Comment: Retired  Tobacco Use   Smoking status: Former Smoker   Smokeless tobacco: Never Used  Substance and Sexual Activity   Alcohol use: Yes    Comment:  14   Drug use: Not on file   Sexual activity: Not on file  Other Topics Concern   Not on file  Social History Narrative   Not on file   Social Determinants of Health   Financial Resource Strain:    Difficulty of Paying Living Expenses: Not on file  Food Insecurity:    Worried About [place] in the Last Year: Not on file   Ran Out of Food in the Last Year: Not on file  Transportation Needs:    Lack of Transportation (Medical): Not on file   Lack of Transportation (Non-Medical): Not on file  Physical Activity:    Days of Exercise per Week: Not on file   Minutes of Exercise per Session: Not on file  Stress: No Stress Concern Present   Feeling of Stress : Not at all  Social Connections:    Frequency of Communication with Friends and Family: Not on file   Frequency of Social Gatherings with Friends and Family: Not on file   Attends Religious Services: Not on file   Active Member of Clubs or Organizations: Not on file    Attends Archivist Meetings: Not on file   Marital Status: Not on file  Intimate Partner Violence:    Fear of Current or Ex-Partner: Not on file   Emotionally Abused: Not on file   Physically Abused: Not on file   Sexually Abused: Not on file     Review of Systems    General:  No chills, fever, night sweats or weight changes.  Cardiovascular:  No chest pain, dyspnea on exertion, edema, orthopnea, palpitations, paroxysmal nocturnal dyspnea. Dermatological: No rash, lesions/masses Respiratory: No cough, dyspnea Urologic: No hematuria, dysuria Abdominal:   No nausea, vomiting, diarrhea, bright red blood per rectum, melena, or hematemesis Neurologic:  No visual changes, wkns, changes in mental status. All other systems reviewed and are otherwise negative except as noted above.  Physical Exam    VS:  BP (!) 172/74 (BP Location: Left Arm, Patient Position: Sitting, Cuff Size: Normal)    Pulse (!) 56    Ht 6' (1.829 m)    Wt 211 lb 12.8 oz (96.1 kg)    SpO2 98%    BMI 28.73 kg/m  , BMI Body mass index is 28.73 kg/m. GEN: Well nourished, well developed, in no acute distress. HEENT: normal. Neck: Supple, no JVD, carotid bruits, or masses. Cardiac: RRR, no murmurs, rubs, or gallops. No clubbing, cyanosis, edema.  Radials/DP/PT 2+ and equal bilaterally.  Respiratory:  Respirations regular and unlabored, clear to auscultation bilaterally. GI: Soft, nontender, nondistended, BS + x 4. MS: no deformity or atrophy. Skin: warm and dry, no rash. Neuro:  Strength and sensation are intact. Psych: Normal affect.  Accessory Clinical Findings    ECG personally reviewed by me today-none today.  EKG 12/17/2018 Normal sinus rhythm 76 bpm   EKG 10/20/2018 sinus bradycardia 49 bpm- No acute changes  EKG 02/12/2016 Sinus bradycardia 49 bpm  Cardiac catheterization 11/26/2018  Ost LAD to Prox LAD lesion is 60% stenosed. Prox LAD to Mid LAD lesion is 50% stenosed.  Mid LAD  lesion is 90% stenosed with 95% stenosed side branch in 1st Diag. Hazy globular calcified lesion at bifurcation  Mid LAD to Dist LAD lesion is 35% stenosed. Mid Cx lesion is 35% stenosed. Not significant  Proximal RCA: Prox RCA-1 lesion is 70% stenosed -tapering to Prox RCA-2 lesion is 90% stenosed, follwed by Prox RCA-3 lesion is  50% stenosed.  Mid RCA lesion is 99% stenosed with 90% stenosed side branch in Acute Mrg. Hazy globular calcified lesion at branch point  The left ventricular systolic function is normal.  LV end diastolic pressure is normal.  The left ventricular ejection fraction is 55-65% by visual estimate.  There is no aortic valve stenosis.  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  Moderate LCx-OM 30%  Well preserved LVEF with normal LVEDP.  Echocardiogram 10/26/2018 IMPRESSIONS   1. Left ventricular ejection fraction, by visual estimation, is 60 to 65%. The left ventricle has normal function. Normal left ventricular size. There is mildly increased left ventricular hypertrophy. 2. Left ventricular diastolic Doppler parameters are consistent with impaired relaxation pattern of LV diastolic filling. 3. Global right ventricle has normal systolic function.The right ventricular size is normal. No increase in right ventricular wall thickness. 4. Left atrial size was moderately dilated. 5. Right atrial size was normal. 6. The mitral valve is normal in structure. Mild mitral valve regurgitation. No evidence of mitral stenosis. 7. The tricuspid  valve is normal in structure. Tricuspid valve regurgitation is mild. 8. The aortic valve is normal in structure. Aortic valve regurgitation was not visualized by color flow Doppler. Mild to moderate aortic valve sclerosis/calcification without any evidence of aortic stenosis. 9. The pulmonic valve was normal in structure. Pulmonic valve regurgitation is not visualized by color flow Doppler. 10. Mildly elevated pulmonary artery systolic pressure. 11. The inferior vena cava is normal in size with greater than 50% respiratory variability, suggesting right atrial pressure of 3 mmHg. 12. The average left ventricular global longitudinal strain is -18.3 %.   Assessment & Plan   1.  Essential hypertension-BP today  172/74. Increase amlodipine 5 mg tablet twice daily Continue metoprolol 12.5 mg tablet twice daily Continue irbesartan 300 daily Increase hydralazine to 50 mg 3 times daily Start chlorthalidone 25 mg daily  Follow-up AAA duplex reviewed with Dr. Stanford Breed- Keep one year FU [place], September 2021. Heart healthy low-sodium diet-salty 6 given Maintain physical activity Keep blood pressure log  BMP in 1 week.  Hyperlipidemia-11/26/2018: Cholesterol 213; HDL 31; LDL Cholesterol 131; Triglycerides 254; VLDL 51 Continue statin Heart healthy low-sodium high-fiber diet-salty 6 given Increase physical activity as tolerated  Abdominal aortic aneurysm-measuring 3.2 cm. Follow-up AAA duplex reviewed with Dr. Stanford Breed- Keep one year FU [place], September 2021. Repeat AAA duplex 10/2019  Disposition: Follow-up with hypertension clinic in 1 month and Dr. Stanford Breed in 3 months.  Jossie Ng. [facility] [address] Office [phone] Fax [phone]

## 2019-03-22 ENCOUNTER — Encounter: Payer: Self-pay | Admitting: General Practice

## 2019-03-22 ENCOUNTER — Ambulatory Visit: Payer: Medicare HMO | Admitting: General Practice

## 2019-03-22 ENCOUNTER — Telehealth: Payer: Self-pay

## 2019-03-22 ENCOUNTER — Other Ambulatory Visit: Payer: Self-pay

## 2019-03-22 VITALS — BP 172/74 | HR 56 | Ht 72.0 in | Wt 211.8 lb

## 2019-03-22 DIAGNOSIS — I714 Abdominal aortic aneurysm, without rupture, unspecified: Secondary | ICD-10-CM

## 2019-03-22 DIAGNOSIS — E7800 Pure hypercholesterolemia, unspecified: Secondary | ICD-10-CM | POA: Diagnosis not present

## 2019-03-22 DIAGNOSIS — I10 Essential (primary) hypertension: Secondary | ICD-10-CM

## 2019-03-22 MED ORDER — HYDRALAZINE HCL 50 MG PO TABS: 50.0000 mg | ORAL_TABLET | Freq: Three times a day (TID) | ORAL | 12 refills | Status: DC

## 2019-03-22 MED ORDER — HYDROCHLOROTHIAZIDE 12.5 MG PO CAPS: 12.5000 mg | ORAL_CAPSULE | Freq: Every day | ORAL | 3 refills | Status: DC

## 2019-03-22 MED ORDER — CHLORTHALIDONE 25 MG PO TABS: 25.0000 mg | ORAL_TABLET | Freq: Every day | ORAL | 6 refills | Status: DC

## 2019-03-22 MED ORDER — AMLODIPINE BESYLATE 10 MG PO TABS: 10.0000 mg | ORAL_TABLET | Freq: Every day | ORAL | 3 refills | Status: DC

## 2019-03-22 NOTE — Telephone Encounter (Signed)
Pt was here this morning for OV and @ 3pm Coletta Memos, FNP wanted to change his plan for pt's medications. Do not take HCTZ instead start chlorthalidone 25mg  daily. I have called the pharmacy and pt has not yet picked up the HCTZ so I cancelled. And e-rx'd the chlorthalidone 25mg (per Pharmacist) @ [facility], [address]. [address]; [phone] (Phone). I have left a detailed message for pt on his cell phone and have mailed pt a New AVS.

## 2019-03-22 NOTE — Patient Instructions (Addendum)
Medication Instructions:  START CHLORTHALIDONE 25MG  DAILY  AMLODIPINE 5MG  (1/2TAB) TWICE DAILY  HYDRALAZINE 50MG  THREE TIMES DAILY   If you need a refill on your cardiac medications before your next appointment, please call your pharmacy.  Labwork: BMET IN ONE WEEK HERE IN OUR OFFICE AT [facility]    You will NOT need to fast   If you have labs (blood work) drawn today and your tests are completely normal, you will receive your results only by: Marland Kitchen MyChart Message (if you have MyChart) OR A paper copy in the mail If you have any lab test that is abnormal or we need to change your treatment, we will call you to review these results. You may go to any [facility] lab that is convenient for you however, we do have a lab in our office that is able to assist you. You do NOT need an appointment for our lab. Once in our office in our office lobby there is a podium where you sign-in and ring the doorbell to alert [place] that you are here. Lab is open 8:00am and closes at 4:00pm; closes for lunch from 12:45 - 1:45pm. PLEASE BRING A COPY OF YOUR INSURANCE CARD WITH YOU.  Special Instructions: PLEASE READ AND FOLLOW SALTY 6 ATTACHED  Reduce your risk of getting COVID-19 With your heart disease it is especially important for people at increased risk of severe illness from COVID-19, and those who live with them, to protect themselves from getting COVID-19. The best way to protect yourself and to help reduce the spread of the virus that causes COVID-19 is to: Marland Kitchen Limit your interactions with other people as much as possible. . Take precautions to prevent getting COVID-19 when you do interact with others. If you start feeling sick and think you may have COVID-19, get in touch with your healthcare provider within 24 hours.  Follow-Up: 3 months  In Person Kirk Ruths, MD.    [facility]  At [facility], you and your health needs are our priority.  As part of our  continuing mission to provide you with exceptional heart care, we have created designated Provider Care Teams.  These Care Teams include your primary Cardiologist (physician) and Advanced Practice Providers (APPs -  Physician Assistants and Nurse Practitioners) who all work together to provide you with the care you need, when you need it.  Thank you for choosing [facility] at [facility]!!

## 2019-03-22 NOTE — Addendum Note (Signed)
Addended by: Waylan Rocher on: 03/22/2019 03:35 PM   Modules accepted: Orders

## 2019-03-23 DIAGNOSIS — E041 Nontoxic single thyroid nodule: Secondary | ICD-10-CM | POA: Diagnosis not present

## 2019-03-23 DIAGNOSIS — I10 Essential (primary) hypertension: Secondary | ICD-10-CM | POA: Diagnosis not present

## 2019-03-23 DIAGNOSIS — G609 Hereditary and idiopathic neuropathy, unspecified: Secondary | ICD-10-CM | POA: Diagnosis not present

## 2019-03-23 DIAGNOSIS — M1A0710 Idiopathic chronic gout, right ankle and foot, without tophus (tophi): Secondary | ICD-10-CM | POA: Diagnosis not present

## 2019-03-31 DIAGNOSIS — D225 Melanocytic nevi of trunk: Secondary | ICD-10-CM | POA: Diagnosis not present

## 2019-03-31 DIAGNOSIS — Z85828 Personal history of other malignant neoplasm of skin: Secondary | ICD-10-CM | POA: Diagnosis not present

## 2019-03-31 DIAGNOSIS — L814 Other melanin hyperpigmentation: Secondary | ICD-10-CM | POA: Diagnosis not present

## 2019-03-31 DIAGNOSIS — L570 Actinic keratosis: Secondary | ICD-10-CM | POA: Diagnosis not present

## 2019-03-31 DIAGNOSIS — L821 Other seborrheic keratosis: Secondary | ICD-10-CM | POA: Diagnosis not present

## 2019-04-05 DIAGNOSIS — Z79899 Other long term (current) drug therapy: Secondary | ICD-10-CM | POA: Diagnosis not present

## 2019-04-05 DIAGNOSIS — E7800 Pure hypercholesterolemia, unspecified: Secondary | ICD-10-CM | POA: Diagnosis not present

## 2019-04-05 LAB — LIPID PANEL
Chol/HDL Ratio: 2.5 ratio (ref 0.0–5.0)
Cholesterol, Total: 113 mg/dL (ref 100–199)
HDL: 46 mg/dL (ref >39)
LDL Chol Calc (NIH): 47 mg/dL (ref 0–99)
Triglycerides: 107 mg/dL (ref 0–149)
VLDL Cholesterol Cal: 20 mg/dL (ref 5–40)

## 2019-04-08 ENCOUNTER — Other Ambulatory Visit: Payer: Self-pay

## 2019-04-08 ENCOUNTER — Ambulatory Visit (INDEPENDENT_AMBULATORY_CARE_PROVIDER_SITE_OTHER): Payer: Medicare HMO | Admitting: Pharmacist Clinician (PhC)/ Clinical Pharmacy Specialist

## 2019-04-08 VITALS — BP 128/62 | HR 62 | Ht 72.0 in | Wt 209.0 lb

## 2019-04-08 DIAGNOSIS — I10 Essential (primary) hypertension: Secondary | ICD-10-CM

## 2019-04-08 MED ORDER — SPIRONOLACTONE 25 MG PO TABS: 25.0000 mg | ORAL_TABLET | Freq: Every day | ORAL | 3 refills | Status: DC

## 2019-04-08 NOTE — Patient Instructions (Signed)
Return for a a follow up appointment in 3 weeks  Go to the lab in 2 weeks  Your blood pressure today is 128/62  Check your blood pressure at home twice daily and keep record of the readings.  Take your BP meds as follows:  AM:  Metoprolol 12.5 mg, irbesartan 300 mg, chlorthalidone 25 mg  PM:  Metoprolol 12. 5 mg, amlodipine 10 mg, spironolactone 25 mg  Bring your BP cuff and your record of home blood pressures to your next appointment.  Exercise as you're able, try to walk approximately 30 minutes per day.  Keep salt intake to a minimum, especially watch canned and prepared boxed foods.  Eat more fresh fruits and vegetables and fewer canned items.  Avoid eating in fast food restaurants.    HOW TO TAKE YOUR BLOOD PRESSURE: . Rest 5 minutes before taking your blood pressure. .  Don't smoke or drink caffeinated beverages for at least 30 minutes before. . Take your blood pressure before (not after) you eat. . Sit comfortably with your back supported and both feet on the floor (don't cross your legs). . Elevate your arm to heart level on a table or a desk. . Use the proper sized cuff. It should fit smoothly and snugly around your bare upper arm. There should be enough room to slip a fingertip under the cuff. The bottom edge of the cuff should be 1 inch above the crease of the elbow. . Ideally, take 3 measurements at one sitting and record the average.

## 2019-04-08 NOTE — Progress Notes (Signed)
04/10/2019 ARTEMIO GREENLEE [birth year] [id number]   HPI:  Curtis Weiss is a 74 y.o. male patient of Dr Stanford Breed, with a PMH below who presents today for hypertension clinic evaluation.  He reports having high blood pressure for many years, although mostly controlled until the last few months.  He went to ED on this past New Year's Eve after having his pressure checked by an Tour manager and noted it to be 280/140.  In the ED note it was charted at 183/90.   He was started on amlodipine 5 mg daily and told to follow up with his provider.   He called to [facility] on January 1, noting it was still elevated and he was advised to take the amlodipine twice daily.  Four days later he was seen in the office by Coletta Memos, where his pressure was noted to be 138/76.  He admitted to some dietary indiscretions over the holidays, and had the losartan switched out to irbesartan 150 mg.  Two weeks later at a follow up visit with Doreene Adas he was found to have a pressure of 161/79.  Irbesartan was increased to 300 mg daily.  About 10 days later he sent word that his PCP had added hydralazine 25 mg tid.  On Feb 16 his pressure remained elevated at 172/74 on a visit to [facility].  Hydralazine was increased to 50 mg tid and chlorthalidone 25 mg daily was added.     Today he returns for follow up.  He continues to complain about headaches and some blurred vision.  He is wondering if some of the BP medication might be the cause.  He has no other symptoms.  States compliance with medications, although admits to finding it sometimes challenging to take mid-day dose of hydralazine.    Past Medical History: ASCVD 11/2018 CABG x 3   AAA 10/2018 - 3.3 cm abdominal aneurysm  Peripheral neuropathy idiopathic  hyperlipidemia 3/21:  TC 113, TG 107, HDL45, LDL 47 - on atorvastatin 80, ezetimibe 10  gout On colchicine and allopurinol     Blood Pressure Goal:  130/80  Current Medications: amlodipine 5 mg bid ,  metoprolol 12.5 mg bid, irbesartan 300 mg qd (am), hydralazine 50 mg bid, chlorthalidone 25 mg qd (am)  Family Hx: mother died at 58 with hypertension, father at 28 after fall and broken hip; brother with CABG, sister died from lung cancer; 2 daughters - 1 with minor hypertension, curretnly no meds  Social Hx: no tobacco, quit 40 years ago; 1-2 drinks daily; 1 coffee in the morning not all days  Diet: mostly home cooked; quit adding salt in past severa months  Exercise: previously went to gym tiw; now walks 2-2.5 miles daily, almost all days  Home BP readings: multiple readings with him, range from 132-171/71-91.  Last 17 days (since adding chlorthalidone) average 156/80  Intolerances: nkda  Labs: 2/21:  Na 144, K 4.4, Glu 107, BUN 9, SCr 0.89  Wt Readings from Last 3 Encounters:  04/08/19 209 lb (94.8 kg)  03/22/19 211 lb 12.8 oz (96.1 kg)  02/22/19 211 lb 6.4 oz (95.9 kg)   BP Readings from Last 3 Encounters:  04/08/19 128/62  03/22/19 (!) 172/74  02/22/19 (!) 161/79   Pulse Readings from Last 3 Encounters:  04/08/19 62  03/22/19 (!) 56  02/22/19 92    Current Outpatient Medications  Medication Sig Dispense Refill  . allopurinol (ZYLOPRIM) 100 MG tablet Take 100 mg by  mouth daily.    Marland Kitchen amLODipine (NORVASC) 10 MG tablet Take 1 tablet (10 mg total) by mouth daily. 30 tablet 3  . aspirin EC 81 MG tablet Take 81 mg by mouth daily.    Marland Kitchen atorvastatin (LIPITOR) 80 MG tablet Take 1 tablet (80 mg total) by mouth daily at 6 PM. 90 tablet 3  . chlorthalidone (HYGROTON) 25 MG tablet Take 1 tablet (25 mg total) by mouth daily. 30 tablet 6  . clopidogrel (PLAVIX) 75 MG tablet Take 1 tablet (75 mg total) by mouth daily. 90 tablet 3  . colchicine 0.6 MG tablet Take 0.6 mg by mouth daily.    Marland Kitchen ezetimibe (ZETIA) 10 MG tablet Take 1 tablet (10 mg total) by mouth daily. 90 tablet 3  . hydrALAZINE (APRESOLINE) 50 MG tablet Take 1 tablet (50 mg total) by mouth 3 (three) times daily. (Patient  taking differently: Take 50 mg by mouth 2 (two) times daily. ) 90 tablet 12  . irbesartan (AVAPRO) 300 MG tablet Take 1 tablet (300 mg total) by mouth daily. 30 tablet 3  . metoprolol tartrate (LOPRESSOR) 25 MG tablet Take 0.5 tablets (12.5 mg total) by mouth 2 (two) times daily. 90 tablet 3  . Multiple Vitamins-Minerals (MULTIVITAMIN PO) Take 1 tablet by mouth daily.    Marland Kitchen spironolactone (ALDACTONE) 25 MG tablet Take 1 tablet (25 mg total) by mouth daily. 30 tablet 3   No current facility-administered medications for this visit.    No Known Allergies  Past Medical History:  Diagnosis Date  . AAA (abdominal aortic aneurysm) without rupture ([place])   . Chronic insomnia   . Herniated lumbar intervertebral disc   . Hypertension   . Idiopathic peripheral neuropathy   . Pure hypercholesterolemia     Blood pressure 128/62, pulse 62, height 6' (1.829 m), weight 209 lb (94.8 kg).  Hypertension Patient with mixed systolic/diastolic hypertension, although at goal in the office today.  He is concerned about hydralazine possibly causing headaches, and with difficult tid dosing, will switch this to spironolactone 25 mg daily for now.  He is to continue with home monitoring, no more than twice daily, and will need to bring his home cuff in for verification at his next visit.  He will need to go to the lab in 2 weeks for a BMET, then follow up with [place] a week later.    Tommy Medal PharmD CPP [facility] [address] [place],  [address] [phone]

## 2019-04-10 NOTE — Assessment & Plan Note (Signed)
Patient with mixed systolic/diastolic hypertension, although at goal in the office today.  He is concerned about hydralazine possibly causing headaches, and with difficult tid dosing, will switch this to spironolactone 25 mg daily for now.  He is to continue with home monitoring, no more than twice daily, and will need to bring his home cuff in for verification at his next visit.  He will need to go to the lab in 2 weeks for a BMET, then follow up with [place] a week later.

## 2019-04-14 DIAGNOSIS — H5203 Hypermetropia, bilateral: Secondary | ICD-10-CM | POA: Diagnosis not present

## 2019-04-14 DIAGNOSIS — H524 Presbyopia: Secondary | ICD-10-CM | POA: Diagnosis not present

## 2019-04-14 DIAGNOSIS — H52223 Regular astigmatism, bilateral: Secondary | ICD-10-CM | POA: Diagnosis not present

## 2019-04-19 ENCOUNTER — Ambulatory Visit: Payer: Medicare HMO

## 2019-04-22 DIAGNOSIS — I10 Essential (primary) hypertension: Secondary | ICD-10-CM | POA: Diagnosis not present

## 2019-04-23 LAB — BASIC METABOLIC PANEL
BUN/Creatinine Ratio: 18 (ref 10–24)
BUN: 17 mg/dL (ref 8–27)
CO2: 19 mmol/L — ABNORMAL LOW (ref 20–29)
Calcium: 10.4 mg/dL — ABNORMAL HIGH (ref 8.6–10.2)
Chloride: 103 mmol/L (ref 96–106)
Creatinine, Ser: 0.96 mg/dL (ref 0.76–1.27)
GFR calc Af Amer: 90 mL/min/{1.73_m2} (ref >59)
GFR calc non Af Amer: 78 mL/min/{1.73_m2} (ref >59)
Glucose: 102 mg/dL — ABNORMAL HIGH (ref 65–99)
Potassium: 4.7 mmol/L (ref 3.5–5.2)
Sodium: 140 mmol/L (ref 134–144)

## 2019-04-25 ENCOUNTER — Ambulatory Visit: Payer: Medicare HMO | Admitting: Cardiology

## 2019-04-25 NOTE — Progress Notes (Signed)
Patient ID: Curtis Weiss                 DOB: 23-Aug-1945                      MRN: [id number]     HPI: Curtis Weiss is a 74 y.o. male referred by Dr. Stanford Breed, with a PMH significant for HTN, CAD, AAA without rupture, & is s/p CABGx3 in 11/2018 presenting today to the HTN clinic for a 3 week follow up. Spironolactone was initiated during last OV on 04/08/2019 and repeat BMET shows stable renal function and electrolytes. Hydralazine was discontinued during last HTN clinic visit d/t headaches concerns. Patient reports headaches have not stopped despite no longer taking hydralazine. He denies blurry vision, dizziness, lightheadedness, or fatigue. Pt mentioned his BP was well-controlled prior to surgery when he was taking quinapril, HCTZ, & amlodipine before open heart surgery. He will like to decrease pill burden and resume some of the medication he used to take in the past to manage his BP.  Current HTN meds: amlodipine 10mg  qPM metoprolol 12.5mg  BID irbesartan 300mg  qAM chlorthalidone 25mg  qAM spironolactone 25mg  qPM  Previously tried: Quinapril  Lisinopril 20mg  daily - hypotension (shortly after open heart surgery) Hydralazine 50mg  bid - stopped d/t headaches (though headaches continued after d/c)   BP goal: <130/80  Family History:  Mother - HTN (deceased at 81 yo) Father - died at 36 after fall and broken hip Brother - CABG Sister - Lung cancer (deceased) Daughter - HTN  Social History: Former smoker (quit 40 yrs ago), 1-2 drinks daily, drinks 1 coffee in the morning but not everyday  Diet: mostly home cooked meals, quit adding salt a few months back  Exercise: Walks 2-2.5 miles on most days with some days where he walks more.  Home BP readings:  AM range = 123-158/70-89 (avg=138/79) PM range = 148-184/76-92 (avg=164/82)  Wt Readings from Last 3 Encounters:  04/26/19 210 lb (95.3 kg)  04/08/19 209 lb (94.8 kg)  03/22/19 211 lb 12.8 oz (96.1 kg)   BP Readings from Last 3  Encounters:  04/26/19 124/68  04/08/19 128/62  03/22/19 (!) 172/74   Pulse Readings from Last 3 Encounters:  04/26/19 68  04/08/19 62  03/22/19 (!) 56    Renal function: Estimated Creatinine Clearance: 80.9 mL/min (by C-G formula based on SCr of 0.96 mg/dL).  Past Medical History:  Diagnosis Date  . AAA (abdominal aortic aneurysm) without rupture ([place])   . Chronic insomnia   . Herniated lumbar intervertebral disc   . Hypertension   . Idiopathic peripheral neuropathy   . Pure hypercholesterolemia     Current Outpatient Medications on File Prior to Visit  Medication Sig Dispense Refill  . allopurinol (ZYLOPRIM) 100 MG tablet Take 100 mg by mouth daily.    Marland Kitchen amLODipine (NORVASC) 10 MG tablet Take 1 tablet (10 mg total) by mouth daily. 30 tablet 3  . aspirin EC 81 MG tablet Take 81 mg by mouth daily.    Marland Kitchen atorvastatin (LIPITOR) 80 MG tablet Take 1 tablet (80 mg total) by mouth daily at 6 PM. 90 tablet 3  . clopidogrel (PLAVIX) 75 MG tablet Take 1 tablet (75 mg total) by mouth daily. 90 tablet 3  . colchicine 0.6 MG tablet Take 0.6 mg by mouth daily.    Marland Kitchen ezetimibe (ZETIA) 10 MG tablet Take 1 tablet (10 mg total) by mouth daily. 90 tablet 3  . Multiple Vitamins-Minerals (  MULTIVITAMIN PO) Take 1 tablet by mouth daily.    Marland Kitchen spironolactone (ALDACTONE) 25 MG tablet Take 1 tablet (25 mg total) by mouth daily. 30 tablet 3   No current facility-administered medications on file prior to visit.    No Known Allergies  Blood pressure 124/68, pulse 68, height 6' (1.829 m), weight 210 lb (95.3 kg), SpO2 96 %.  Hypertension BP in clinic appears well controlled. Home BP readings show high readings in the evening and it is unclear how soon before or after taking his mediation BP readings are done.  Pt was instructed to record time in relation to meds to better assess response to therapy. Since headaches appear to not be related to hydralazine. Will resume hydralazine but as needed for  breakthrough hypertension. Other medication changes done today are d/c irbesartan, chlorthalidone, and metoprolol. Start carvedilol 6.25mg  BID,Quinapril/HCTZ, and continue amlodipine 10mg  qPM & spironolactone 25mg  qPM. Plan to follow up in 3 weeks but patient is traveling to [place] for vacation and will prefer follow up in 5-6 weeks instead.     Demetrio Leighty Rodriguez-Guzman PharmD, BCPS, [place] [address] 04/27/2019 12:06 PM

## 2019-04-26 ENCOUNTER — Other Ambulatory Visit: Payer: Self-pay

## 2019-04-26 ENCOUNTER — Ambulatory Visit (INDEPENDENT_AMBULATORY_CARE_PROVIDER_SITE_OTHER): Payer: Medicare HMO | Admitting: Pharmacist

## 2019-04-26 VITALS — BP 124/68 | HR 68 | Ht 72.0 in | Wt 210.0 lb

## 2019-04-26 DIAGNOSIS — I10 Essential (primary) hypertension: Secondary | ICD-10-CM

## 2019-04-26 MED ORDER — CARVEDILOL 6.25 MG PO TABS: 6.2500 mg | ORAL_TABLET | Freq: Two times a day (BID) | ORAL | 1 refills | Status: DC

## 2019-04-26 MED ORDER — QUINAPRIL-HYDROCHLOROTHIAZIDE 20-25 MG PO TABS: 1.0000 | ORAL_TABLET | Freq: Every day | ORAL | 1 refills | Status: DC

## 2019-04-26 MED ORDER — HYDRALAZINE HCL 50 MG PO TABS: 50.0000 mg | ORAL_TABLET | Freq: Three times a day (TID) | ORAL | 0 refills | Status: DC | PRN

## 2019-04-26 NOTE — Patient Instructions (Addendum)
Return for a  follow up appointment in 4-5 weeks  Check your blood pressure at home daily (if able) and keep record of the readings.  Take your BP meds as follows:  Morning - amlodipine 10mg , carvedilol 6.25mg , and quinapril/HCT Supper - carvedilol 6.25mg  and spironolactone 25mg   *Take hydralazine 50mg  as needed for blood pressure above 180*   *STOP taking metoprolol, irbesartan and chlorthalidone  Bring all of your meds, your BP cuff and your record of home blood pressures to your next appointment.  Exercise as you're able, try to walk approximately 30 minutes per day.  Keep salt intake to a minimum, especially watch canned and prepared boxed foods.  Eat more fresh fruits and vegetables and fewer canned items.  Avoid eating in fast food restaurants.    HOW TO TAKE YOUR BLOOD PRESSURE: . Rest 5 minutes before taking your blood pressure. .  Don't smoke or drink caffeinated beverages for at least 30 minutes before. . Take your blood pressure before (not after) you eat. . Sit comfortably with your back supported and both feet on the floor (don't cross your legs). . Elevate your arm to heart level on a table or a desk. . Use the proper sized cuff. It should fit smoothly and snugly around your bare upper arm. There should be enough room to slip a fingertip under the cuff. The bottom edge of the cuff should be 1 inch above the crease of the elbow. . Ideally, take 3 measurements at one sitting and record the average.

## 2019-04-27 ENCOUNTER — Encounter: Payer: Self-pay | Admitting: Pharmacist

## 2019-04-27 NOTE — Assessment & Plan Note (Signed)
BP in clinic appears well controlled. Home BP readings show high readings in the evening and it is unclear how soon before or after taking his mediation BP readings are done.  Pt was instructed to record time in relation to meds to better assess response to therapy. Since headaches appear to not be related to hydralazine. Will resume hydralazine but as needed for breakthrough hypertension. Other medication changes done today are d/c irbesartan, chlorthalidone, and metoprolol. Start carvedilol 6.25mg  BID,Quinapril/HCTZ, and continue amlodipine 10mg  qPM & spironolactone 25mg  qPM. Plan to follow up in 3 weeks but patient is traveling to [place] for vacation and will prefer follow up in 5-6 weeks instead.

## 2019-05-17 DIAGNOSIS — R69 Illness, unspecified: Secondary | ICD-10-CM | POA: Diagnosis not present

## 2019-05-23 ENCOUNTER — Ambulatory Visit: Payer: Medicare HMO | Admitting: Cardiology

## 2019-05-26 DIAGNOSIS — R69 Illness, unspecified: Secondary | ICD-10-CM | POA: Diagnosis not present

## 2019-05-31 ENCOUNTER — Ambulatory Visit (INDEPENDENT_AMBULATORY_CARE_PROVIDER_SITE_OTHER): Payer: Medicare HMO | Admitting: Pharmacist Clinician (PhC)/ Clinical Pharmacy Specialist

## 2019-05-31 ENCOUNTER — Other Ambulatory Visit: Payer: Self-pay

## 2019-05-31 DIAGNOSIS — I10 Essential (primary) hypertension: Secondary | ICD-10-CM

## 2019-05-31 DIAGNOSIS — R69 Illness, unspecified: Secondary | ICD-10-CM | POA: Diagnosis not present

## 2019-05-31 NOTE — Progress Notes (Signed)
Patient ID: Curtis Weiss                 DOB: [birth year]                      MRN: [id number]     HPI: Curtis Weiss is a 74 y.o. male referred by Dr. Stanford Breed, with a PMH significant for HTN, CAD, AAA without rupture, & is s/p CABGx3 in 11/2018 presenting today to the HTN clinic for a 3 week follow up. Spironolactone was initiated on 04/08/2019 and repeat BMET showed stable renal function and electrolytes. Hydralazine was discontinued d/t headaches concerns, however they continued despite stopping medication. At his last visit, patient mentioned his BP was well-controlled prior to surgery when he was taking quinapril, HCTZ, & amlodipine.  We therefore stopped the irbesartan and chlorthalidone and put him back on quinapril hctz 20/25 mg daily.  He returns today for follow up.  Today he denies blurry vision, dizziness, lightheadedness, or fatigue.  He did have an episode of gout back in February, but otherwise feels well.  His home BP readings have dropped somewhat since going back on his prior regimen and he is happy with this.    Current HTN meds: Quinapril hctz 20/25 mg qd Amlodipine 10 mg qpm Carvedilol 6.25 mg bid Hydralazine 50 mg up to tid prn spironolactone 25mg  qpm  Previously tried:  Irbesartan Chlorthalidone metoprolol Lisinopril 20mg  daily - hypotension (shortly after open heart surgery) Hydralazine 50mg  bid - stopped d/t headaches (though headaches continued after d/c)   BP goal: <130/80  Family History:  Mother - HTN (deceased at 56 yo) Father - died at 69 after fall and broken hip Brother - CABG Sister - Lung cancer (deceased) Daughter - HTN  Social History: Former smoker (quit 40 yrs ago), 1-2 drinks daily, drinks 1 coffee in the morning but not everyday  Diet: mostly home cooked meals, quit adding salt a few months back  Exercise: Walks 2-2.5 miles on most days with some days where he walks more.  Home BP readings:  AM range = 100-147/59-92 (avg=135/77 down from  previous at 138/79) PM range = 128-165/71-87 (avg=151/79 down from previous at 164/82)  Wt Readings from Last 3 Encounters:  05/31/19 208 lb 12.8 oz (94.7 kg)  04/26/19 210 lb (95.3 kg)  04/08/19 209 lb (94.8 kg)   BP Readings from Last 3 Encounters:  05/31/19 112/64  04/26/19 124/68  04/08/19 128/62   Pulse Readings from Last 3 Encounters:  04/26/19 68  04/08/19 62  03/22/19 (!) 56    Renal function: CrCl cannot be calculated (Patient's most recent lab result is older than the maximum 21 days allowed.).  Past Medical History:  Diagnosis Date  . AAA (abdominal aortic aneurysm) without rupture ([place])   . Chronic insomnia   . Herniated lumbar intervertebral disc   . Hypertension   . Idiopathic peripheral neuropathy   . Pure hypercholesterolemia     Current Outpatient Medications on File Prior to Visit  Medication Sig Dispense Refill  . allopurinol (ZYLOPRIM) 100 MG tablet Take 100 mg by mouth daily.    Marland Kitchen amLODipine (NORVASC) 10 MG tablet Take 1 tablet (10 mg total) by mouth daily. 30 tablet 3  . aspirin EC 81 MG tablet Take 81 mg by mouth daily.    Marland Kitchen atorvastatin (LIPITOR) 80 MG tablet Take 1 tablet (80 mg total) by mouth daily at 6 PM. 90 tablet 3  . carvedilol (COREG) 6.25 MG  tablet Take 1 tablet (6.25 mg total) by mouth 2 (two) times daily. 180 tablet 1  . clopidogrel (PLAVIX) 75 MG tablet Take 1 tablet (75 mg total) by mouth daily. 90 tablet 3  . ezetimibe (ZETIA) 10 MG tablet Take 10 mg by mouth daily.    . hydrALAZINE (APRESOLINE) 50 MG tablet Take 1 tablet (50 mg total) by mouth 3 (three) times daily as needed (for systolic blood pressure above 180). 90 tablet 0  . Multiple Vitamins-Minerals (MULTIVITAMIN PO) Take 1 tablet by mouth daily.    . quinapril-hydrochlorothiazide (ACCURETIC) 20-25 MG tablet Take 1 tablet by mouth daily. 90 tablet 1  . spironolactone (ALDACTONE) 25 MG tablet Take 1 tablet (25 mg total) by mouth daily. 30 tablet 3   No current  facility-administered medications on file prior to visit.    No Known Allergies  Blood pressure 112/64, temperature 98.2 F (36.8 C), height 6' (1.829 m), weight 208 lb 12.8 oz (94.7 kg).  Hypertension Patient with essential hypertension now mostly controlled.  In office his blood pressure is excellent, although he does show higher readings at home.  He will continue with his current medications and regular home BP monitoring.  We discussed chance of hctz increasing his risk of further gout flares and should this happen, he knows he can contact our office.  He is due to see Dr. Stanford Breed next month and will need to get metabolic panel drawn before that visit.  We can follow up with him again later should it be necessary.     Curtis Weiss PharmD CPP [facility] [address] [place] [address] 06/02/2019 6:52 AM

## 2019-05-31 NOTE — Patient Instructions (Signed)
Call our office if you notice your BP too low (around [id number] systolic) or are feeling washed out/dizziness.   If you notice more issues with gout, please don't hesitate to call - we can consider stopping the hydrochlorothiazide if necessary  Go to the lab 3-4 days prior to your appointment with Dr. Stanford Breed  Check your blood pressure at home 3-4 days per week and keep record of the readings.  Take your BP meds as follows:  Continue with all current medications  Bring all of your meds, your BP cuff and your record of home blood pressures to your next appointment.  Exercise as you're able, try to walk approximately 30 minutes per day.  Keep salt intake to a minimum, especially watch canned and prepared boxed foods.  Eat more fresh fruits and vegetables and fewer canned items.  Avoid eating in fast food restaurants.    HOW TO TAKE YOUR BLOOD PRESSURE: . Rest 5 minutes before taking your blood pressure. .  Don't smoke or drink caffeinated beverages for at least 30 minutes before. . Take your blood pressure before (not after) you eat. . Sit comfortably with your back supported and both feet on the floor (don't cross your legs). . Elevate your arm to heart level on a table or a desk. . Use the proper sized cuff. It should fit smoothly and snugly around your bare upper arm. There should be enough room to slip a fingertip under the cuff. The bottom edge of the cuff should be 1 inch above the crease of the elbow. . Ideally, take 3 measurements at one sitting and record the average.

## 2019-06-01 ENCOUNTER — Encounter: Payer: Self-pay | Admitting: Pharmacist Clinician (PhC)/ Clinical Pharmacy Specialist

## 2019-06-02 NOTE — Assessment & Plan Note (Signed)
Patient with essential hypertension now mostly controlled.  In office his blood pressure is excellent, although he does show higher readings at home.  He will continue with his current medications and regular home BP monitoring.  We discussed chance of hctz increasing his risk of further gout flares and should this happen, he knows he can contact our office.  He is due to see Dr. Stanford Breed next month and will need to get metabolic panel drawn before that visit.  We can follow up with him again later should it be necessary.

## 2019-06-16 DIAGNOSIS — R748 Abnormal levels of other serum enzymes: Secondary | ICD-10-CM | POA: Diagnosis not present

## 2019-06-16 NOTE — Progress Notes (Signed)
HPI: FUCAD and hypertension.Based on outside records he has had renal Dopplers previously that showed no renal artery stenosis and normal catecholamine and cortisol levels. Abdominal ultrasound September 2020 showed 3.3 cm abdominal aortic aneurysm. There was greater than 50% stenosis in the right common iliac artery.  Echocardiogram September 2020 showed normal LV function, moderate left atrial enlargement, mild mitral and tricuspid regurgitation.  Cardiac catheterization October 2020 showed significant LAD and RCA stenosis.  Carotid Dopplers October 2020 showed 1 to 39% bilateral stenosis.  Patient underwent coronary artery bypass and graft October 2020 with LIMA to the LAD, RIMA to the PDA and saphenous vein graft to the diagonal.  At follow-up office visit patient found to have low blood pressure and medications reduced.  Since last seen he denies chest pain, palpitations or syncope.  He has dyspnea with more vigorous activities but is able to walk 2-1/2 to 3 miles with no dyspnea.  Current Outpatient Medications  Medication Sig Dispense Refill  . allopurinol (ZYLOPRIM) 100 MG tablet Take 100 mg by mouth daily.    Marland Kitchen amLODipine (NORVASC) 10 MG tablet Take 1 tablet (10 mg total) by mouth daily. 30 tablet 3  . aspirin EC 81 MG tablet Take 81 mg by mouth daily.    Marland Kitchen atorvastatin (LIPITOR) 80 MG tablet Take 1 tablet (80 mg total) by mouth daily at 6 PM. 90 tablet 3  . carvedilol (COREG) 6.25 MG tablet Take 1 tablet (6.25 mg total) by mouth 2 (two) times daily. 180 tablet 1  . clopidogrel (PLAVIX) 75 MG tablet Take 1 tablet (75 mg total) by mouth daily. 90 tablet 3  . colchicine 0.6 MG tablet TAKE ONE TABLET BY MOUTH DAILY    . ezetimibe (ZETIA) 10 MG tablet Take 10 mg by mouth daily.    . hydrALAZINE (APRESOLINE) 50 MG tablet Take 50 mg by mouth as directed.    . Multiple Vitamins-Minerals (MULTIVITAMIN PO) Take 1 tablet by mouth daily.    . quinapril-hydrochlorothiazide (ACCURETIC) 20-25 MG  tablet Take 1 tablet by mouth daily. 90 tablet 1  . spironolactone (ALDACTONE) 25 MG tablet Take 1 tablet (25 mg total) by mouth daily. 30 tablet 3   No current facility-administered medications for this visit.     Past Medical History:  Diagnosis Date  . AAA (abdominal aortic aneurysm) without rupture ([place])   . Chronic insomnia   . Herniated lumbar intervertebral disc   . Hypertension   . Idiopathic peripheral neuropathy   . Pure hypercholesterolemia     Past Surgical History:  Procedure Laterality Date  . Arm surgery    . CLIPPING OF ATRIAL APPENDAGE N/A 11/30/2018   Procedure: Clipping Of Atrial Appendage - using AtriCure clip size 18mm;  Surgeon: Wonda Olds, MD;  Location: [facility] OR;  Service: Open Heart Surgery;  Laterality: N/A;  . CORONARY ARTERY BYPASS GRAFT N/A 11/30/2018   Procedure: CORONARY ARTERY BYPASS GRAFTING (CABG) x three, using bilateral mammary arteries and right leg greater saphenous vein harvested endoscopically;  Surgeon: Wonda Olds, MD;  Location: [facility];  Service: Open Heart Surgery;  Laterality: N/A;  . LEFT HEART CATH AND CORONARY ANGIOGRAPHY N/A 11/26/2018   Procedure: LEFT HEART CATH AND CORONARY ANGIOGRAPHY;  Surgeon: Leonie Man, MD;  Location: [facility];  Service: Cardiovascular;  Laterality: N/A;  . SHOULDER SURGERY    . TEE WITHOUT CARDIOVERSION N/A 11/30/2018   Procedure: TRANSESOPHAGEAL ECHOCARDIOGRAM (TEE);  Surgeon: Wonda Olds, MD;  Location: [facility];  Service: Open Heart Surgery;  Laterality: N/A;    Social History   Socioeconomic History  . Marital status: Married    Spouse name: Not on file  . Number of children: 3  . Years of education: Not on file  . Highest education level: Not on file  Occupational History    Comment: Retired  Tobacco Use  . Smoking status: Former Research scientist (life sciences)  . Smokeless tobacco: Never Used  Substance and Sexual Activity  . Alcohol use: Yes    Comment: 14  . Drug use: Not on file  .  Sexual activity: Not on file  Other Topics Concern  . Not on file  Social History Narrative  . Not on file   Social Determinants of Health   Financial Resource Strain:   . Difficulty of Paying Living Expenses:   Food Insecurity:   . Worried About Charity fundraiser in the Last Year:   . Arboriculturist in the Last Year:   Transportation Needs:   . Film/video editor (Medical):   Marland Kitchen Lack of Transportation (Non-Medical):   Physical Activity:   . Days of Exercise per Week:   . Minutes of Exercise per Session:   Stress: No Stress Concern Present  . Feeling of Stress : Not at all  Social Connections:   . Frequency of Communication with Friends and Family:   . Frequency of Social Gatherings with Friends and Family:   . Attends Religious Services:   . Active Member of Clubs or Organizations:   . Attends Archivist Meetings:   Marland Kitchen Marital Status:   Intimate Partner Violence:   . Fear of Current or Ex-Partner:   . Emotionally Abused:   Marland Kitchen Physically Abused:   . Sexually Abused:     Family History  Problem Relation Age of Onset  . Hypertension Mother   . Hypertension Father     ROS: no fevers or chills, productive cough, hemoptysis, dysphasia, odynophagia, melena, hematochezia, dysuria, hematuria, rash, seizure activity, orthopnea, PND, pedal edema, claudication. Remaining systems are negative.  Physical Exam: Well-developed well-nourished in no acute distress.  Skin is warm and dry.  HEENT is normal.  Neck is supple.  Chest is clear to auscultation with normal expansion.  Status post tenotomy with no evidence of infection. Cardiovascular exam is regular rate and rhythm.  Abdominal exam nontender or distended. No masses palpated. Extremities show no edema. neuro grossly intact   A/P  1 CAD s/p CABG-No CP; continue medical therapy with ASA and statin.  Discontinue Plavix.  2 Hypertension-BP controlled; continue present meds.  3 Hyperlipidemia-continue  statin.  4 AAA-FU ultrasound 9/21.  5 elevated GGT-we will arrange liver ultrasound to exclude significant abnormality.  Kirk Ruths, MD

## 2019-06-17 LAB — GAMMA GT: GGT: 74 IU/L — ABNORMAL HIGH (ref 0–65)

## 2019-06-17 LAB — LIPID PANEL
Chol/HDL Ratio: 3 ratio (ref 0.0–5.0)
Cholesterol, Total: 114 mg/dL (ref 100–199)
HDL: 38 mg/dL — ABNORMAL LOW (ref >39)
LDL Chol Calc (NIH): 47 mg/dL (ref 0–99)
Triglycerides: 175 mg/dL — ABNORMAL HIGH (ref 0–149)
VLDL Cholesterol Cal: 29 mg/dL (ref 5–40)

## 2019-06-17 LAB — NUCLEOTIDASE, 5', BLOOD: 5-Nucleotidase: 3 IU/L (ref 0–10)

## 2019-06-21 ENCOUNTER — Encounter: Payer: Self-pay | Admitting: Cardiology

## 2019-06-21 ENCOUNTER — Other Ambulatory Visit: Payer: Self-pay

## 2019-06-21 ENCOUNTER — Ambulatory Visit: Payer: Medicare HMO | Admitting: Cardiology

## 2019-06-21 VITALS — BP 124/62 | HR 68 | Ht 72.0 in | Wt 212.8 lb

## 2019-06-21 DIAGNOSIS — I10 Essential (primary) hypertension: Secondary | ICD-10-CM | POA: Diagnosis not present

## 2019-06-21 DIAGNOSIS — I25110 Atherosclerotic heart disease of native coronary artery with unstable angina pectoris: Secondary | ICD-10-CM | POA: Diagnosis not present

## 2019-06-21 DIAGNOSIS — G609 Hereditary and idiopathic neuropathy, unspecified: Secondary | ICD-10-CM | POA: Diagnosis not present

## 2019-06-21 DIAGNOSIS — R7989 Other specified abnormal findings of blood chemistry: Secondary | ICD-10-CM | POA: Diagnosis not present

## 2019-06-21 DIAGNOSIS — I714 Abdominal aortic aneurysm, without rupture, unspecified: Secondary | ICD-10-CM

## 2019-06-21 DIAGNOSIS — E041 Nontoxic single thyroid nodule: Secondary | ICD-10-CM | POA: Diagnosis not present

## 2019-06-21 DIAGNOSIS — C672 Malignant neoplasm of lateral wall of bladder: Secondary | ICD-10-CM | POA: Diagnosis not present

## 2019-06-21 DIAGNOSIS — M1A0710 Idiopathic chronic gout, right ankle and foot, without tophus (tophi): Secondary | ICD-10-CM | POA: Diagnosis not present

## 2019-06-21 DIAGNOSIS — E7800 Pure hypercholesterolemia, unspecified: Secondary | ICD-10-CM | POA: Diagnosis not present

## 2019-06-21 NOTE — Patient Instructions (Signed)
Medication Instructions:  STOP PLAVIX *If you need a refill on your cardiac medications before your next appointment, please call your pharmacy*   Lab Work: If you have labs (blood work) drawn today and your tests are completely normal, you will receive your results only by: Marland Kitchen MyChart Message (if you have MyChart) OR . A paper copy in the mail If you have any lab test that is abnormal or we need to change your treatment, we will call you to review the results.   Testing/Procedures: Your physician has requested that you have an abdominal aorta duplex. During this test, an ultrasound is used to evaluate the aorta. Allow 30 minutes for this exam. Do not eat after midnight the day before and avoid carbonated beverages SCHEDULE IN September  LIVER US AT [address]= [facility]   Follow-Up: At [facility], you and your health needs are our priority.  As part of our continuing mission to provide you with exceptional heart care, we have created designated Provider Care Teams.  These Care Teams include your primary Cardiologist (physician) and Advanced Practice Providers (APPs -  Physician Assistants and Nurse Practitioners) who all work together to provide you with the care you need, when you need it.  We recommend signing up for the patient portal called "MyChart".  Sign up information is provided on this After Visit Summary.  MyChart is used to connect with patients for Virtual Visits (Telemedicine).  Patients are able to view lab/test results, encounter notes, upcoming appointments, etc.  Non-urgent messages can be sent to your provider as well.   To learn more about what you can do with MyChart, go to [email or web address].    Your next appointment:   6 month(s)  The format for your next appointment:   Either In Person or Virtual  Provider:   You may see Kirk Ruths, MD or one of the following Advanced Practice Providers on your designated Care Team:    Kerin Ransom, PA-C  [place], [place]  Coletta Memos, [place]

## 2019-06-28 ENCOUNTER — Ambulatory Visit
Admission: RE | Admit: 2019-06-28 | Discharge: 2019-06-28 | Disposition: A | Discharge status: Home / Self Care | Payer: Medicare HMO | Source: Ambulatory Visit | Attending: Cardiology | Admitting: Cardiology

## 2019-06-28 ENCOUNTER — Other Ambulatory Visit: Payer: Self-pay | Admitting: General Practice

## 2019-06-28 ENCOUNTER — Other Ambulatory Visit: Payer: Self-pay | Admitting: Cardiology

## 2019-06-28 DIAGNOSIS — K760 Fatty (change of) liver, not elsewhere classified: Secondary | ICD-10-CM | POA: Diagnosis not present

## 2019-06-28 DIAGNOSIS — I10 Essential (primary) hypertension: Secondary | ICD-10-CM

## 2019-06-28 DIAGNOSIS — R7989 Other specified abnormal findings of blood chemistry: Secondary | ICD-10-CM

## 2019-06-29 ENCOUNTER — Other Ambulatory Visit: Payer: Self-pay | Admitting: *Deleted

## 2019-06-29 DIAGNOSIS — I714 Abdominal aortic aneurysm, without rupture, unspecified: Secondary | ICD-10-CM

## 2019-07-18 MED ORDER — AMLODIPINE BESYLATE 10 MG PO TABS: 10.0000 mg | ORAL_TABLET | Freq: Every day | ORAL | 3 refills | Status: DC

## 2019-08-07 ENCOUNTER — Other Ambulatory Visit: Payer: Self-pay | Admitting: General Practice

## 2019-08-07 DIAGNOSIS — I10 Essential (primary) hypertension: Secondary | ICD-10-CM

## 2019-08-14 ENCOUNTER — Other Ambulatory Visit: Payer: Self-pay | Admitting: General Practice

## 2019-08-14 DIAGNOSIS — I10 Essential (primary) hypertension: Secondary | ICD-10-CM

## 2019-08-23 ENCOUNTER — Other Ambulatory Visit: Payer: Self-pay | Admitting: General Practice

## 2019-08-23 DIAGNOSIS — I10 Essential (primary) hypertension: Secondary | ICD-10-CM

## 2019-08-25 DIAGNOSIS — M10079 Idiopathic gout, unspecified ankle and foot: Secondary | ICD-10-CM | POA: Diagnosis not present

## 2019-08-25 DIAGNOSIS — Z0000 Encounter for general adult medical examination without abnormal findings: Secondary | ICD-10-CM | POA: Diagnosis not present

## 2019-08-25 DIAGNOSIS — G609 Hereditary and idiopathic neuropathy, unspecified: Secondary | ICD-10-CM | POA: Diagnosis not present

## 2019-08-25 DIAGNOSIS — M1A0710 Idiopathic chronic gout, right ankle and foot, without tophus (tophi): Secondary | ICD-10-CM | POA: Diagnosis not present

## 2019-08-25 DIAGNOSIS — R7309 Other abnormal glucose: Secondary | ICD-10-CM | POA: Diagnosis not present

## 2019-08-25 DIAGNOSIS — Z125 Encounter for screening for malignant neoplasm of prostate: Secondary | ICD-10-CM | POA: Diagnosis not present

## 2019-08-25 DIAGNOSIS — E7800 Pure hypercholesterolemia, unspecified: Secondary | ICD-10-CM | POA: Diagnosis not present

## 2019-08-25 DIAGNOSIS — E041 Nontoxic single thyroid nodule: Secondary | ICD-10-CM | POA: Diagnosis not present

## 2019-08-25 DIAGNOSIS — I714 Abdominal aortic aneurysm, without rupture: Secondary | ICD-10-CM | POA: Diagnosis not present

## 2019-08-25 DIAGNOSIS — I10 Essential (primary) hypertension: Secondary | ICD-10-CM | POA: Diagnosis not present

## 2019-08-25 DIAGNOSIS — C672 Malignant neoplasm of lateral wall of bladder: Secondary | ICD-10-CM | POA: Diagnosis not present

## 2019-09-01 DIAGNOSIS — Z0100 Encounter for examination of eyes and vision without abnormal findings: Secondary | ICD-10-CM | POA: Diagnosis not present

## 2019-09-19 DIAGNOSIS — E041 Nontoxic single thyroid nodule: Secondary | ICD-10-CM | POA: Diagnosis not present

## 2019-09-20 ENCOUNTER — Other Ambulatory Visit: Payer: Self-pay | Admitting: Cardiology

## 2019-10-18 ENCOUNTER — Ambulatory Visit (HOSPITAL_COMMUNITY)
Admission: RE | Admit: 2019-10-18 | Discharge: 2019-10-18 | Disposition: A | Discharge status: Home / Self Care | Payer: Medicare HMO | Source: Ambulatory Visit | Attending: Internal Medicine | Admitting: Internal Medicine

## 2019-10-18 ENCOUNTER — Other Ambulatory Visit: Payer: Self-pay

## 2019-10-18 ENCOUNTER — Other Ambulatory Visit (HOSPITAL_COMMUNITY): Payer: Self-pay | Admitting: Cardiology

## 2019-10-18 DIAGNOSIS — I714 Abdominal aortic aneurysm, without rupture, unspecified: Secondary | ICD-10-CM

## 2019-10-19 ENCOUNTER — Telehealth: Payer: Self-pay | Admitting: *Deleted

## 2019-10-19 NOTE — Telephone Encounter (Signed)
Left message to call back  

## 2019-10-19 NOTE — Telephone Encounter (Signed)
Advised patient, verbalized understanding  

## 2019-10-19 NOTE — Telephone Encounter (Signed)
Curtis Weiss is returning Curtis Weiss's call.

## 2019-10-19 NOTE — Telephone Encounter (Signed)
-----   Message from Lelon Perla, MD sent at 10/18/2019  9:28 AM EDT ----- FU study one year Curtis Weiss

## 2019-10-20 ENCOUNTER — Other Ambulatory Visit: Payer: Self-pay | Admitting: Cardiology

## 2019-10-21 NOTE — Telephone Encounter (Signed)
Please review patient request for refill. Patients last office visit was 06/21/19 and we were continuing patient on current medication for hypertension. When I try to authorize refill it states that there is a high drug reaction between spironolactone and quinapril-HCTZ. Interaction states hyperkalemia, possibly with cardiac arrhythmias or arrest. I know provider is monitoring patient on medications, please advise if okay to override. KW

## 2019-10-21 NOTE — Telephone Encounter (Signed)
Please review. KW 

## 2019-10-29 DIAGNOSIS — R69 Illness, unspecified: Secondary | ICD-10-CM | POA: Diagnosis not present

## 2019-10-31 DIAGNOSIS — C672 Malignant neoplasm of lateral wall of bladder: Secondary | ICD-10-CM | POA: Diagnosis not present

## 2019-10-31 DIAGNOSIS — N5203 Combined arterial insufficiency and corporo-venous occlusive erectile dysfunction: Secondary | ICD-10-CM | POA: Diagnosis not present

## 2019-11-22 DIAGNOSIS — Z86010 Personal history of colonic polyps: Secondary | ICD-10-CM | POA: Diagnosis not present

## 2019-11-22 DIAGNOSIS — K649 Unspecified hemorrhoids: Secondary | ICD-10-CM | POA: Diagnosis not present

## 2019-11-22 DIAGNOSIS — K5730 Diverticulosis of large intestine without perforation or abscess without bleeding: Secondary | ICD-10-CM | POA: Diagnosis not present

## 2019-11-22 DIAGNOSIS — K635 Polyp of colon: Secondary | ICD-10-CM | POA: Diagnosis not present

## 2019-11-22 DIAGNOSIS — K640 First degree hemorrhoids: Secondary | ICD-10-CM | POA: Diagnosis not present

## 2019-11-22 DIAGNOSIS — D123 Benign neoplasm of transverse colon: Secondary | ICD-10-CM | POA: Diagnosis not present

## 2019-11-22 DIAGNOSIS — Z1211 Encounter for screening for malignant neoplasm of colon: Secondary | ICD-10-CM | POA: Diagnosis not present

## 2019-12-01 DIAGNOSIS — H01001 Unspecified blepharitis right upper eyelid: Secondary | ICD-10-CM | POA: Diagnosis not present

## 2019-12-01 DIAGNOSIS — H04123 Dry eye syndrome of bilateral lacrimal glands: Secondary | ICD-10-CM | POA: Diagnosis not present

## 2019-12-01 DIAGNOSIS — H01004 Unspecified blepharitis left upper eyelid: Secondary | ICD-10-CM | POA: Diagnosis not present

## 2020-01-19 ENCOUNTER — Other Ambulatory Visit: Payer: Self-pay | Admitting: Adult Health

## 2020-02-09 ENCOUNTER — Other Ambulatory Visit: Payer: Self-pay

## 2020-02-09 ENCOUNTER — Ambulatory Visit (INDEPENDENT_AMBULATORY_CARE_PROVIDER_SITE_OTHER): Payer: Medicare HMO | Admitting: Cardiology

## 2020-02-09 ENCOUNTER — Encounter: Payer: Self-pay | Admitting: Cardiology

## 2020-02-09 VITALS — BP 122/62 | HR 53 | Temp 97.3°F | Ht 72.0 in | Wt 215.8 lb

## 2020-02-09 DIAGNOSIS — I714 Abdominal aortic aneurysm, without rupture, unspecified: Secondary | ICD-10-CM

## 2020-02-09 DIAGNOSIS — E7800 Pure hypercholesterolemia, unspecified: Secondary | ICD-10-CM

## 2020-02-09 DIAGNOSIS — I25110 Atherosclerotic heart disease of native coronary artery with unstable angina pectoris: Secondary | ICD-10-CM

## 2020-02-09 DIAGNOSIS — I10 Essential (primary) hypertension: Secondary | ICD-10-CM

## 2020-02-09 MED ORDER — HYDRALAZINE HCL 50 MG PO TABS: 50.0000 mg | ORAL_TABLET | Freq: Two times a day (BID) | ORAL | 3 refills | Status: DC

## 2020-02-09 NOTE — Progress Notes (Signed)
HPI: FUCAD andhypertension.Based on outside records he has had renal Dopplers previously that showed no renal artery stenosis and normal catecholamine and cortisol levels. Echocardiogram September 2020 showed normal LV function, moderate left atrial enlargement, mild mitral and tricuspid regurgitation. Cardiac catheterization October 2020 showed significant LAD and RCA stenosis. Carotid Dopplers October 2020 showed 1 to 39% bilateral stenosis. Patient underwent coronary artery bypass and graft October 2020 with LIMA to the LAD, RIMA to the PDA and saphenous vein graft to the diagonal. Abdominal ultrasound September 2021 showed 3.2 cm abdominal aortic aneurysm.  Since last seen  he has some dyspnea with more vigorous activities.  No chest pain or syncope.  He notes fatigue.  Current Outpatient Medications  Medication Sig Dispense Refill  . allopurinol (ZYLOPRIM) 100 MG tablet Take 100 mg by mouth daily.    Marland Kitchen amLODipine (NORVASC) 10 MG tablet Take 1 tablet (10 mg total) by mouth daily. 90 tablet 3  . aspirin EC 81 MG tablet Take 81 mg by mouth daily.    Marland Kitchen atorvastatin (LIPITOR) 80 MG tablet TAKE ONE TABLET BY MOUTH DAILY AT 6PM 90 tablet 3  . carvedilol (COREG) 6.25 MG tablet TAKE ONE TABLET BY MOUTH TWICE A DAY 134 tablet 1  . colchicine 0.6 MG tablet TAKE ONE TABLET BY MOUTH DAILY    . ezetimibe (ZETIA) 10 MG tablet Take 10 mg by mouth daily.    . hydrALAZINE (APRESOLINE) 50 MG tablet Take 50 mg by mouth as directed.    . Multiple Vitamins-Minerals (MULTIVITAMIN PO) Take 1 tablet by mouth daily.    . quinapril-hydrochlorothiazide (ACCURETIC) 20-25 MG tablet TAKE ONE TABLET BY MOUTH DAILY  **TO REPLACE IRBESARTAN AND CHLORTHALIDONE.** 90 tablet 3  . spironolactone (ALDACTONE) 25 MG tablet TAKE ONE TABLET BY MOUTH DAILY 25 tablet 2   No current facility-administered medications for this visit.     Past Medical History:  Diagnosis Date  . AAA (abdominal aortic aneurysm) without  rupture ([place])   . Chronic insomnia   . Herniated lumbar intervertebral disc   . Hypertension   . Idiopathic peripheral neuropathy   . Pure hypercholesterolemia     Past Surgical History:  Procedure Laterality Date  . Arm surgery    . CLIPPING OF ATRIAL APPENDAGE N/A 11/30/2018   Procedure: Clipping Of Atrial Appendage - using AtriCure clip size 16mm;  Surgeon: Wonda Olds, MD;  Location: [facility];  Service: Open Heart Surgery;  Laterality: N/A;  . CORONARY ARTERY BYPASS GRAFT N/A 11/30/2018   Procedure: CORONARY ARTERY BYPASS GRAFTING (CABG) x three, using bilateral mammary arteries and right leg greater saphenous vein harvested endoscopically;  Surgeon: Wonda Olds, MD;  Location: [place];  Service: Open Heart Surgery;  Laterality: N/A;  . LEFT HEART CATH AND CORONARY ANGIOGRAPHY N/A 11/26/2018   Procedure: LEFT HEART CATH AND CORONARY ANGIOGRAPHY;  Surgeon: Leonie Man, MD;  Location: [place] [facility];  Service: Cardiovascular;  Laterality: N/A;  . SHOULDER SURGERY    . TEE WITHOUT CARDIOVERSION N/A 11/30/2018   Procedure: TRANSESOPHAGEAL ECHOCARDIOGRAM (TEE);  Surgeon: Wonda Olds, MD;  Location: [place];  Service: Open Heart Surgery;  Laterality: N/A;    Social History   Socioeconomic History  . Marital status: Married    Spouse name: Not on file  . Number of children: 3  . Years of education: Not on file  . Highest education level: Not on file  Occupational History    Comment: Retired  Tobacco Use  . Smoking status: Former Games developer  . Smokeless tobacco: Never Used  Substance and Sexual Activity  . Alcohol use: Yes    Comment: 14  . Drug use: Not on file  . Sexual activity: Not on file  Other Topics Concern  . Not on file  Social History Narrative  . Not on file   Social Determinants of Health   Financial Resource Strain: Not on file  Food Insecurity: Not on file  Transportation Needs: Not on file  Physical Activity: Not on file  Stress: Not on  file  Social Connections: Not on file  Intimate Partner Violence: Not on file    Family History  Problem Relation Age of Onset  . Hypertension Mother   . Hypertension Father     ROS: no fevers or chills, productive cough, hemoptysis, dysphasia, odynophagia, melena, hematochezia, dysuria, hematuria, rash, seizure activity, orthopnea, PND, pedal edema, claudication. Remaining systems are negative.  Physical Exam: Well-developed well-nourished in no acute distress.  Skin is warm and dry.  HEENT is normal.  Neck is supple.  Chest is clear to auscultation with normal expansion.  Cardiovascular exam is regular rate and rhythm.  Abdominal exam nontender or distended. No masses palpated. Extremities show no edema. neuro grossly intact  ECG-sinus bradycardia at a rate of 53, left ventricular hypertrophy, nonspecific ST changes.  Personally reviewed  A/P  1 coronary artery disease status post coronary artery bypass graft-patient denies chest pain.  Continue aspirin and statin.  2 hypertension-patient's blood pressure is controlled.  However he is describing fatigue.  I wonder if the beta-blocker is contributing.  We will wean carvedilol to off (decreased to 3.125 mg twice daily for 2 days then discontinue).  We will increase hydralazine to 50 mg twice daily.  Follow blood pressure and adjust regimen as needed.  3 hyperlipidemia-continue statin.  We will have most recent lipids and liver forwarded to [place] from primary care.  4 abdominal aortic aneurysm-plan follow-up ultrasound September 2022.  Olga Millers, MD

## 2020-02-09 NOTE — Patient Instructions (Signed)
Medication Instructions:   TAKE CARVEDILOL 3.125 MG TWICE DAILY X 2 DAYS AND THEN STOP= 1/2 OF THE 6.25 MG TABLETS TWICE DAILY X 2 DAYS THEN STOP  INCREASE HYDRALAZINE TO 50 MG TWICE DAILY  *If you need a refill on your cardiac medications before your next appointment, please call your pharmacy*   Follow-Up: At [facility], you and your health needs are our priority.  As part of our continuing mission to provide you with exceptional heart care, we have created designated Provider Care Teams.  These Care Teams include your primary Cardiologist (physician) and Advanced Practice Providers (APPs -  Physician Assistants and Nurse Practitioners) who all work together to provide you with the care you need, when you need it.  We recommend signing up for the patient portal called "MyChart".  Sign up information is provided on this After Visit Summary.  MyChart is used to connect with patients for Virtual Visits (Telemedicine).  Patients are able to view lab/test results, encounter notes, upcoming appointments, etc.  Non-urgent messages can be sent to your provider as well.   To learn more about what you can do with MyChart, go to [email or web address].    Your next appointment:   12 month(s)  The format for your next appointment:   In Person  Provider:   Olga Millers, MD

## 2020-02-12 ENCOUNTER — Other Ambulatory Visit: Payer: Self-pay | Admitting: Cardiology

## 2020-02-13 MED ORDER — HYDRALAZINE HCL 25 MG PO TABS: 25.0000 mg | ORAL_TABLET | Freq: Two times a day (BID) | ORAL | 2 refills | Status: DC

## 2020-02-14 ENCOUNTER — Other Ambulatory Visit: Payer: Self-pay

## 2020-02-14 ENCOUNTER — Ambulatory Visit (INDEPENDENT_AMBULATORY_CARE_PROVIDER_SITE_OTHER): Payer: Medicare HMO | Admitting: Pharmacist Clinician (PhC)/ Clinical Pharmacy Specialist

## 2020-02-14 DIAGNOSIS — I10 Essential (primary) hypertension: Secondary | ICD-10-CM | POA: Diagnosis not present

## 2020-02-14 NOTE — Progress Notes (Signed)
Patient ID: Curtis Weiss                 DOB: Oct 03, 1945                      MRN: [id number]     HPI: Curtis Weiss is a 75 y.o. male referred by Dr. Stanford Breed, with a PMH significant for HTN, CAD, AAA without rupture, & is s/p CABGx3 in 11/2018 presenting today to the HTN clinic for follow up.  We originally worked with patient about a year ago and were able to stabilize his blood pressure on a combination of quinapril hctz, carvedilol, amlodipine and spironolactone.   He returns today after having recently seen Dr. Stanford Breed, as there is some apparent confusion in what he is actually taking.  When he saw Dr. Stanford Breed last week he was noted to have ongoing fatigue.  Because of HR at 53, his carvedilol was tapered down and discontinued and he was asked to increase hydralazine to 50 mg twice daily.  Because his medication list was incorrect, he was not actually taking this.  At a previous visit with his PCP his spironolactone was switched to eplerenone because of complaints of gynecomastia (this was also not noted in Epic)  Today he is in the office with all of his medication bottles, as well as home blood pressure cuff.  We were able to make corrections to his chart and current medication list is correct to the best of our abilities.    Current HTN meds: Quinapril hctz 20/25 mg qd Amlodipine 10 mg qpm Hydralazine 25 mg bid - started 02/09/20 Eplerenone 50 mg qd - switched from spironolactone by PCP d/t gynecomastia  Previously tried:  Irbesartan Chlorthalidone metoprolol Lisinopril 20mg  daily - hypotension (shortly after open heart surgery) Hydralazine 50mg  bid - stopped d/t headaches (though headaches continued after d/c) Spironolactone - gynecomastia   BP goal: <130/80  Family History:  Mother - HTN (deceased at 55 yo) Father - died at 2 after fall and broken hip Brother - CABG Sister - Lung cancer (deceased) Daughter - HTN  Social History: Former smoker (quit 40 yrs ago), 1-2 drinks  daily, drinks 1 coffee in the morning but not everyday  Diet: mostly home cooked meals, no added salt  Exercise: Walks 2-2.5 miles on most days with some days where he walks more.  Home BP readings: has only checked twice since his appointment with Dr. Stanford Breed last week.  114/61 and 133/77   Wt Readings from Last 3 Encounters:  02/14/20 207 lb (93.9 kg)  02/09/20 215 lb 12.8 oz (97.9 kg)  06/21/19 212 lb 12.8 oz (96.5 kg)   BP Readings from Last 3 Encounters:  02/14/20 126/62  02/09/20 122/62  06/21/19 124/62   Pulse Readings from Last 3 Encounters:  02/14/20 72  02/09/20 (!) 53  06/21/19 68    Renal function: CrCl cannot be calculated (Patient's most recent lab result is older than the maximum 21 days allowed.).  Past Medical History:  Diagnosis Date  . AAA (abdominal aortic aneurysm) without rupture ([place])   . Chronic insomnia   . Herniated lumbar intervertebral disc   . Hypertension   . Idiopathic peripheral neuropathy   . Pure hypercholesterolemia     Current Outpatient Medications on File Prior to Visit  Medication Sig Dispense Refill  . allopurinol (ZYLOPRIM) 100 MG tablet Take 100 mg by mouth daily.    Marland Kitchen amLODipine (NORVASC) 10 MG tablet Take 1  tablet (10 mg total) by mouth daily. 90 tablet 3  . aspirin EC 81 MG tablet Take 81 mg by mouth daily.    Marland Kitchen atorvastatin (LIPITOR) 80 MG tablet TAKE ONE TABLET BY MOUTH DAILY AT 6PM 90 tablet 3  . colchicine 0.6 MG tablet TAKE ONE TABLET BY MOUTH DAILY    . eplerenone (INSPRA) 50 MG tablet Take 50 mg by mouth daily.    Marland Kitchen ezetimibe (ZETIA) 10 MG tablet Take 10 mg by mouth daily.    . hydrALAZINE (APRESOLINE) 25 MG tablet Take 1 tablet (25 mg total) by mouth 2 (two) times daily. 180 tablet 2  . Multiple Vitamins-Minerals (MULTIVITAMIN PO) Take 1 tablet by mouth daily.    . quinapril-hydrochlorothiazide (ACCURETIC) 20-25 MG tablet TAKE ONE TABLET BY MOUTH DAILY  **TO REPLACE IRBESARTAN AND CHLORTHALIDONE.** 90 tablet 3    No current facility-administered medications on file prior to visit.    No Known Allergies  Blood pressure 126/62, pulse 72, height 6' (1.829 m), weight 207 lb (93.9 kg).  Hypertension Patient with essential hypertension, currently well controlled on 5 medications.  Heart rate increased significantly since discontinuation of carvedilol, however it is still too soon to know if this was the cause of his fatigue.  Advised that he continue with current medications at this time and he should be sure to bring updated medication list from home to all future appointments, due to his PCP not being in the Epic system.  He will continue with healthy lifestyle and current medications, but was asked to call should the headaches he previously noted with hydralazine return.    Tommy Medal PharmD CPP [facility] [address] [place] [address] 02/15/2020 10:11 AM

## 2020-02-14 NOTE — Patient Instructions (Signed)
  Check your blood pressure at home several times each week and keep record of the readings.  Take your BP meds as follows: Quinapril hctz 20/25 mg qd Amlodipine 10 mg qpm Hydralazine 25 mg bid  spironolactone 25mg  qd   Bring all of your meds, your BP cuff and your record of home blood pressures to your next appointment.  Exercise as you're able, try to walk approximately 30 minutes per day.  Keep salt intake to a minimum, especially watch canned and prepared boxed foods.  Eat more fresh fruits and vegetables and fewer canned items.  Avoid eating in fast food restaurants.    HOW TO TAKE YOUR BLOOD PRESSURE: . Rest 5 minutes before taking your blood pressure. .  Don't smoke or drink caffeinated beverages for at least 30 minutes before. . Take your blood pressure before (not after) you eat. . Sit comfortably with your back supported and both feet on the floor (don't cross your legs). . Elevate your arm to heart level on a table or a desk. . Use the proper sized cuff. It should fit smoothly and snugly around your bare upper arm. There should be enough room to slip a fingertip under the cuff. The bottom edge of the cuff should be 1 inch above the crease of the elbow. . Ideally, take 3 measurements at one sitting and record the average.

## 2020-02-15 ENCOUNTER — Encounter: Payer: Self-pay | Admitting: Pharmacist Clinician (PhC)/ Clinical Pharmacy Specialist

## 2020-02-15 NOTE — Assessment & Plan Note (Signed)
Patient with essential hypertension, currently well controlled on 5 medications.  Heart rate increased significantly since discontinuation of carvedilol, however it is still too soon to know if this was the cause of his fatigue.  Advised that he continue with current medications at this time and he should be sure to bring updated medication list from home to all future appointments, due to his PCP not being in the Epic system.  He will continue with healthy lifestyle and current medications, but was asked to call should the headaches he previously noted with hydralazine return.

## 2020-03-27 ENCOUNTER — Other Ambulatory Visit: Payer: Self-pay | Admitting: Cardiology

## 2020-08-21 ENCOUNTER — Other Ambulatory Visit: Payer: Self-pay | Admitting: Cardiology

## 2020-09-20 ENCOUNTER — Other Ambulatory Visit: Payer: Self-pay | Admitting: Cardiology

## 2020-09-20 DIAGNOSIS — I25110 Atherosclerotic heart disease of native coronary artery with unstable angina pectoris: Secondary | ICD-10-CM

## 2020-10-17 ENCOUNTER — Other Ambulatory Visit: Payer: Self-pay

## 2020-10-17 ENCOUNTER — Other Ambulatory Visit (HOSPITAL_COMMUNITY): Payer: Self-pay | Admitting: Cardiology

## 2020-10-17 ENCOUNTER — Ambulatory Visit (HOSPITAL_COMMUNITY)
Admission: RE | Admit: 2020-10-17 | Discharge: 2020-10-17 | Disposition: A | Discharge status: Home / Self Care | Payer: Medicare HMO | Source: Ambulatory Visit | Attending: Cardiovascular Disease | Admitting: Cardiovascular Disease

## 2020-10-17 DIAGNOSIS — I714 Abdominal aortic aneurysm, without rupture, unspecified: Secondary | ICD-10-CM

## 2020-10-20 ENCOUNTER — Other Ambulatory Visit: Payer: Self-pay | Admitting: Cardiology

## 2020-11-09 ENCOUNTER — Telehealth: Payer: Self-pay | Admitting: Cardiology

## 2020-11-09 MED ORDER — HYDROCHLOROTHIAZIDE 25 MG PO TABS: 25.0000 mg | ORAL_TABLET | Freq: Every day | ORAL | 3 refills | Status: DC

## 2020-11-09 MED ORDER — QUINAPRIL HCL 20 MG PO TABS: 20.0000 mg | ORAL_TABLET | Freq: Every day | ORAL | 3 refills | Status: DC

## 2020-11-09 NOTE — Telephone Encounter (Signed)
   Pt c/o medication issue:  1. Name of Medication:   quinapril-hydrochlorothiazide (ACCURETIC) 20-25 MG tablet    2. How are you currently taking this medication (dosage and times per day)? [facility] [id number] - [place], [place] - [address]  3. Are you having a reaction (difficulty breathing--STAT)?   4. What is your medication issue? Braxton with [facility] calling, he said they no longer catering this meds, he is asking if they can get a prescription for the 2 meds instead instead a combined meds

## 2020-11-09 NOTE — Telephone Encounter (Signed)
Spoke to pharmacy Mickel Baas)  at [facility] to split medication  1) quinapril 20 mg    +                     2) hydrochlorothiazide 25 mg   #90 days for both.    RN also notified patient of the change. Patient states he has been out of medication for 3 days.

## 2020-11-28 ENCOUNTER — Other Ambulatory Visit: Payer: Self-pay

## 2020-11-28 ENCOUNTER — Ambulatory Visit: Payer: Medicare HMO | Admitting: Physician Assistant

## 2020-11-28 ENCOUNTER — Telehealth: Payer: Self-pay | Admitting: Cardiology

## 2020-11-28 ENCOUNTER — Encounter: Payer: Self-pay | Admitting: Physician Assistant

## 2020-11-28 ENCOUNTER — Other Ambulatory Visit: Payer: Self-pay | Admitting: Physician Assistant

## 2020-11-28 VITALS — BP 112/58 | HR 69 | Ht 72.0 in | Wt 196.0 lb

## 2020-11-28 DIAGNOSIS — R0602 Shortness of breath: Secondary | ICD-10-CM

## 2020-11-28 DIAGNOSIS — I25110 Atherosclerotic heart disease of native coronary artery with unstable angina pectoris: Secondary | ICD-10-CM

## 2020-11-28 DIAGNOSIS — E7800 Pure hypercholesterolemia, unspecified: Secondary | ICD-10-CM

## 2020-11-28 DIAGNOSIS — I10 Essential (primary) hypertension: Secondary | ICD-10-CM

## 2020-11-28 DIAGNOSIS — I714 Abdominal aortic aneurysm, without rupture, unspecified: Secondary | ICD-10-CM | POA: Diagnosis not present

## 2020-11-28 MED ORDER — SODIUM CHLORIDE 0.9% FLUSH
3.0000 mL | Freq: Two times a day (BID) | INTRAVENOUS | Status: DC
Start: 2020-11-28 — End: 2021-09-24

## 2020-11-28 NOTE — H&P (View-Only) (Signed)
Cardiology Office Note:    Date:  11/28/2020   ID:  Curtis Weiss, DOB 1946-01-03, MRN [id number]  PCP:  Basilio Cairo, MD [place] Cardiologist: Kirk Ruths, MD   Reason for visit: Shortness of breath and elevated heart rate  History of Present Illness:    Curtis Weiss is a 75 y.o. male with a hx of HTN, CAD s/p CABGx3 (LIMA to the LAD, RIMA to the PDA and saphenous vein graft to the diagonal) in 11/2018, AAA without rupture, previous renal Doppler with no renal artery stenosis, normal catecholamines and cortisol levels.  Hx of tobacco use 45 years ago.  He was last seen by our pharmacist, Tommy Medal on 02/2020.  There was confusion on what medications the patient was taking -this was straightened out.  Carvedilol was discontinued secondary to fatigue.  Patient had noticed increase in heart rate following this.  Dr. Stanford Breed saw as well in January 2022.  Patient mentioned dyspnea with more vigorous activities and fatigue.  No chest pain.  Recommended follow-up in 1 year.  Today, he is concerned about his dyspnea on exertion as well as increased heart rate with exertion x couple of months.  He walks 2 to 3 miles once or twice a week without symptoms.  [place] he feels short of breath and rest.  He has A. fib and rest after walking up to the green when he is playing golf.  He also has to rest after doing 1 flight of stairs.  He feels like he has to breathe deeper to expand his chest.  He denies the chest pressure he had prior to CABG.  He wonders though if he has another artery blockage.  He denies heart failure symptoms including PND, orthopnea and lower extremity edema.  He mentions that he has a thyroid nodule with failed biopsy x2.  He is following up next week.  He has had 20 pound weight loss last 2 months and states he is eating less/not as hungry.  Denies bloating.  He denies palpitations.  He states he gets a headache if his blood pressure is high.  Has some rare  lightheadedness.  No syncope.  No bleeding issues.  He has a recent runny nose and some cough.  No fever.  States he had COVID 6 months ago.        Past Medical History:  Diagnosis Date   AAA (abdominal aortic aneurysm) without rupture    Chronic insomnia    Herniated lumbar intervertebral disc    Hypertension    Idiopathic peripheral neuropathy    Pure hypercholesterolemia     Past Surgical History:  Procedure Laterality Date   Arm surgery     CLIPPING OF ATRIAL APPENDAGE N/A 11/30/2018   Procedure: Clipping Of Atrial Appendage - using AtriCure clip size 68mm;  Surgeon: Wonda Olds, MD;  Location: [facility];  Service: Open Heart Surgery;  Laterality: N/A;   CORONARY ARTERY BYPASS GRAFT N/A 11/30/2018   Procedure: CORONARY ARTERY BYPASS GRAFTING (CABG) x three, using bilateral mammary arteries and right leg greater saphenous vein harvested endoscopically;  Surgeon: Wonda Olds, MD;  Location: [facility];  Service: Open Heart Surgery;  Laterality: N/A;   LEFT HEART CATH AND CORONARY ANGIOGRAPHY N/A 11/26/2018   Procedure: LEFT HEART CATH AND CORONARY ANGIOGRAPHY;  Surgeon: Leonie Man, MD;  Location: [facility];  Service: Cardiovascular;  Laterality: N/A;   SHOULDER SURGERY     TEE WITHOUT CARDIOVERSION N/A  11/30/2018   Procedure: TRANSESOPHAGEAL ECHOCARDIOGRAM (TEE);  Surgeon: Wonda Olds, MD;  Location: [facility];  Service: Open Heart Surgery;  Laterality: N/A;    Current Medications: Current Meds  Medication Sig   allopurinol (ZYLOPRIM) 100 MG tablet Take 100 mg by mouth daily.   amLODipine (NORVASC) 10 MG tablet TAKE ONE TABLET BY MOUTH DAILY   aspirin EC 81 MG tablet Take 81 mg by mouth daily.   atorvastatin (LIPITOR) 80 MG tablet TAKE ONE TABLET BY MOUTH DAILY AT 6PM   carvedilol (COREG) 6.25 MG tablet TAKE ONE TABLET BY MOUTH TWICE A DAY   colchicine 0.6 MG tablet TAKE ONE TABLET BY MOUTH DAILY   eplerenone (INSPRA) 50 MG tablet Take 50 mg by mouth daily.    ezetimibe (ZETIA) 10 MG tablet Take 10 mg by mouth daily.   hydrALAZINE (APRESOLINE) 25 MG tablet TAKE ONE TABLET BY MOUTH TWICE A DAY   hydrochlorothiazide (HYDRODIURIL) 25 MG tablet Take 1 tablet (25 mg total) by mouth daily.   Multiple Vitamins-Minerals (MULTIVITAMIN PO) Take 1 tablet by mouth daily.   quinapril (ACCUPRIL) 20 MG tablet Take 1 tablet (20 mg total) by mouth at bedtime.   Current Facility-Administered Medications for the 11/28/20 encounter (Office Visit) with Warren Lacy, PA-C  Medication   sodium chloride flush (NS) 0.9 % injection 3 mL     Allergies:   Patient has no known allergies.   Social History   Socioeconomic History   Marital status: Married    Spouse name: Not on file   Number of children: 3   Years of education: Not on file   Highest education level: Not on file  Occupational History    Comment: Retired  Tobacco Use   Smoking status: Former   Smokeless tobacco: Never  Substance and Sexual Activity   Alcohol use: Yes    Comment: 14   Drug use: Not on file   Sexual activity: Not on file  Other Topics Concern   Not on file  Social History Narrative   Not on file   Social Determinants of Health   Financial Resource Strain: Not on file  Food Insecurity: Not on file  Transportation Needs: Not on file  Physical Activity: Not on file  Stress: Not on file  Social Connections: Not on file     Family History: The patient's family history includes Hypertension in his father and mother.  ROS:   Please see the history of present illness.     EKGs/Labs/Other Studies Reviewed:    EKG:  The ekg ordered today demonstrates normal sinus rhythm, no ST changes from January 2022.  Heart rate 69, PR interval 178 ms, QRS duration 78 ms.  Recent Labs: No results found for requested labs within last 8760 hours.   Recent Lipid Panel Lab Results  Component Value Date/Time   CHOL 114 06/16/2019 08:52 AM   TRIG 175 (H) 06/16/2019 08:52 AM   HDL  38 (L) 06/16/2019 08:52 AM   LDLCALC 47 06/16/2019 08:52 AM    Physical Exam:    VS:  BP (!) 112/58 (BP Location: Right Arm, Patient Position: Sitting, Cuff Size: Normal)   Pulse 69   Ht 6' (1.829 m)   Wt 196 lb (88.9 kg)   BMI 26.58 kg/m    No data found.  Wt Readings from Last 3 Encounters:  11/28/20 196 lb (88.9 kg)  02/14/20 207 lb (93.9 kg)  02/09/20 215 lb 12.8 oz (97.9 kg)  GEN:  Well nourished, well developed in no acute distress HEENT: Normal NECK: No JVD; No carotid bruits CARDIAC: RRR, no murmurs, rubs, gallops RESPIRATORY:  Clear to auscultation without rales, wheezing or rhonchi  ABDOMEN: Soft, non-tender, non-distended MUSCULOSKELETAL: No edema; No deformity  SKIN: Warm and dry NEUROLOGIC:  Alert and oriented PSYCHIATRIC:  Normal affect     ASSESSMENT AND PLAN   Shortness of breath -Concerned for anginal equivalent. -Prefer proceeding with LHC as I would not trust the results of the negative stress test. -Discussed case with doctor of the day, Dr. Harriet Masson who agrees with this plan. -No signs of heart failure, prior echo with normal EF and no significant valve disease.  We will hold off on echo for now.  Coronary artery disease status post coronary artery bypass graft -hx of CABGx3 (LIMA to the LAD, RIMA to the PDA and saphenous vein graft to the diagonal) in 11/2018 -Continue with aspirin and statin therapy.   -Off beta-blocker secondary to fatigue.  Hypertension, well controlled -BP may be improved with recent weight loss.  Continue current medications. -He has a follow-up next week with our pharmacist for blood pressure management. -Goal BP is <130/80.  Recommend DASH diet (high in vegetables, fruits, low-fat dairy products, whole grains, poultry, fish, and nuts and low in sweets, sugar-sweetened beverages, and red meats), salt restriction and increase physical activity.  Hyperlipidemia -LDL 47 in May 2021.  Recommend rechecking lipids in the near  future. -Discussed cholesterol lowering diets - Mediterranean diet, DASH diet, vegetarian diet, low-carbohydrate diet and avoidance of trans fats.  Discussed healthier choice substitutes.  Nuts, high-fiber foods, and fiber supplements may also improve lipids.    Abdominal aortic aneurysm -AAA duplex September 2022: stable AAA measuring 3.2 x 3.2 cm.  Repeat study in 1 year.  Disposition - Follow-up in 1 month.  If heart catheterization is unremarkable, then will consider echo and chest x-ray and other differential for his shortness of breath.   Shared Decision Making/Informed Consent The risks [stroke (1 in 1000), death (1 in 1000), kidney failure [usually temporary] (1 in 500), bleeding (1 in 200), allergic reaction [possibly serious] (1 in 200)], benefits (diagnostic support and management of coronary artery disease) and alternatives of a cardiac catheterization were discussed in detail with Curtis Weiss and he is willing to proceed.    Medication Adjustments/Labs and Tests Ordered: Current medicines are reviewed at length with the patient today.  Concerns regarding medicines are outlined above.  Orders Placed This Encounter  Procedures   Basic metabolic panel   CBC   EKG 12-Lead   No orders of the defined types were placed in this encounter.   Patient Instructions  Medication Instructions:  No Changes *If you need a refill on your cardiac medications before your next appointment, please call your pharmacy*   Lab Work: BMET, CBC Today If you have labs (blood work) drawn today and your tests are completely normal, you will receive your results only by: [place] (if you have MyChart) OR A paper copy in the mail If you have any lab test that is abnormal or we need to change your treatment, we will call you to review the results.   Testing/Procedures:  [place]  [place] [address] Dept: [phone] Loc: [phone]  Curtis Weiss  11/28/2020  You are scheduled for a Cardiac Catheterization on Tuesday, November 1 with Dr. [name].  1. Please  arrive at the [facility] (Main Entrance A) at [facility]: [address] [place],  [address] at 10:00 AM (This time is two hours before your procedure to ensure your preparation). Free valet parking service is available.   Special note: Every effort is made to have your procedure done on time. Please understand that emergencies sometimes delay scheduled procedures.  2. Diet: Do not eat solid foods after midnight.  The patient may have clear liquids until 5am upon the day of the procedure.  3. Labs: You will need to have blood drawn on Wednesday, October 26 at [place], [place]  Open: [place] (Lunch 12:30 - 1:30)   Phone: [phone]. You do not need to be fasting.  4. Medication instructions in preparation for your procedure:   Contrast Allergy: No    Current Outpatient Medications (Cardiovascular):    amLODipine (NORVASC) 10 MG tablet, TAKE ONE TABLET BY MOUTH DAILY   atorvastatin (LIPITOR) 80 MG tablet, TAKE ONE TABLET BY MOUTH DAILY AT 6PM   carvedilol (COREG) 6.25 MG tablet, TAKE ONE TABLET BY MOUTH TWICE A DAY   eplerenone (INSPRA) 50 MG tablet, Take 50 mg by mouth daily.   ezetimibe (ZETIA) 10 MG tablet, Take 10 mg by mouth daily.   hydrALAZINE (APRESOLINE) 25 MG tablet, TAKE ONE TABLET BY MOUTH TWICE A DAY   hydrochlorothiazide (HYDRODIURIL) 25 MG tablet, Take 1 tablet (25 mg total) by mouth daily.   quinapril (ACCUPRIL) 20 MG tablet, Take 1 tablet (20 mg total) by mouth at bedtime.   Current Outpatient Medications (Analgesics):    allopurinol (ZYLOPRIM) 100 MG tablet, Take 100 mg by mouth daily.   aspirin EC 81 MG tablet, Take 81 mg by mouth daily.   colchicine 0.6 MG tablet, TAKE ONE TABLET BY MOUTH DAILY   Current Outpatient  Medications (Other):    Multiple Vitamins-Minerals (MULTIVITAMIN PO), Take 1 tablet by mouth daily. *For reference purposes while preparing patient instructions.   Delete this med list prior to printing instructions for patient.*     On the morning of your procedure, take your Aspirin and any morning medicines NOT listed above.  You may use sips of water.  5. Plan for one night stay--bring personal belongings. 6. Bring a current list of your medications and current insurance cards. 7. You MUST have a responsible person to drive you home. 8. Someone MUST be with you the first 24 hours after you arrive home or your discharge will be delayed. 9. Please wear clothes that are easy to get on and off and wear slip-on shoes.  Thank you for allowing [place] to care for you!   --  Invasive Cardiovascular services    Follow-Up: At [facility], you and your health needs are our priority.  As part of our continuing mission to provide you with exceptional heart care, we have created designated Provider Care Teams.  These Care Teams include your primary Cardiologist (physician) and Advanced Practice Providers (APPs -  Physician Assistants and Nurse Practitioners) who all work together to provide you with the care you need, when you need it.  We recommend signing up for the patient portal called "MyChart".  Sign up information is provided on this After Visit Summary.  MyChart is used to connect with patients for Virtual Visits (Telemedicine).  Patients are able to view lab/test results, encounter notes, upcoming appointments, etc.  Non-urgent messages can be sent to your provider as well.   To  learn more about what you can do with MyChart, go to [email or web address].    Your next appointment:   1 month(s)  The format for your next appointment:   In Person  Provider:   Caron Presume, PA-C       Signed, Warren Lacy, PA-C  11/28/2020 12:58 PM    [place]

## 2020-11-28 NOTE — Telephone Encounter (Signed)
  Per MyChart appt request message:  Need an appointment asap with Dr. Stanford Breed or other Cardiologist in office Heart beat with stress 95 to 110 or higher and with this comes shortness of breath. Going [place]ut of town on N0v.11 for 2 weeks need appointment before then.  Thanks  I do not have any appts available till mid-November so I informed patient I would send message to triage to discuss the issue he is having.

## 2020-11-28 NOTE — Patient Instructions (Signed)
Medication Instructions:  No Changes *If you need a refill on your cardiac medications before your next appointment, please call your pharmacy*   Lab Work: BMET, CBC Today If you have labs (blood work) drawn today and your tests are completely normal, you will receive your results only by: [name] (if you have MyChart) OR A paper copy in the mail If you have any lab test that is abnormal or we need to change your treatment, we will call you to review the results.   Testing/Procedures:  [place] [address] Dept: [phone] Loc: [phone]  Curtis Weiss  11/28/2020  You are scheduled for a Cardiac Catheterization on Tuesday, November 1 with Dr. Glenetta Weiss.  1. Please arrive at the [facility] (Main Entrance A) at [facility]: [address] [place], [place] [address] at 10:00 AM (This time is two hours before your procedure to ensure your preparation). Free valet parking service is available.   Special note: Every effort is made to have your procedure done on time. Please understand that emergencies sometimes delay scheduled procedures.  2. Diet: Do not eat solid foods after midnight.  The patient may have clear liquids until 5am upon the day of the procedure.  3. Labs: You will need to have blood drawn on Wednesday, October 26 at [place], [place]  Open: [place] (Lunch 12:30 - 1:30)   Phone: [phone]. You do not need to be fasting.  4. Medication instructions in preparation for your procedure:   Contrast Allergy: No    Current Outpatient Medications (Cardiovascular):    amLODipine (NORVASC) 10 MG tablet, TAKE ONE TABLET BY MOUTH DAILY   atorvastatin (LIPITOR) 80 MG tablet, TAKE ONE TABLET BY MOUTH DAILY AT 6PM   carvedilol (COREG) 6.25 MG tablet, TAKE ONE TABLET BY MOUTH TWICE A DAY   eplerenone  (INSPRA) 50 MG tablet, Take 50 mg by mouth daily.   ezetimibe (ZETIA) 10 MG tablet, Take 10 mg by mouth daily.   hydrALAZINE (APRESOLINE) 25 MG tablet, TAKE ONE TABLET BY MOUTH TWICE A DAY   hydrochlorothiazide (HYDRODIURIL) 25 MG tablet, Take 1 tablet (25 mg total) by mouth daily.   quinapril (ACCUPRIL) 20 MG tablet, Take 1 tablet (20 mg total) by mouth at bedtime.   Current Outpatient Medications (Analgesics):    allopurinol (ZYLOPRIM) 100 MG tablet, Take 100 mg by mouth daily.   aspirin EC 81 MG tablet, Take 81 mg by mouth daily.   colchicine 0.6 MG tablet, TAKE ONE TABLET BY MOUTH DAILY   Current Outpatient Medications (Other):    Multiple Vitamins-Minerals (MULTIVITAMIN PO), Take 1 tablet by mouth daily. *For reference purposes while preparing patient instructions.   Delete this med list prior to printing instructions for patient.*     On the morning of your procedure, take your Aspirin and any morning medicines NOT listed above.  You may use sips of water.  5. Plan for one night stay--bring personal belongings. 6. Bring a current list of your medications and current insurance cards. 7. You MUST have a responsible person to drive you home. 8. Someone MUST be with you the first 24 hours after you arrive home or your discharge will be delayed. 9. Please wear clothes that are easy to get on and off and wear slip-on shoes.  Thank you for allowing [place] to care for you!   -- [facility] Invasive Cardiovascular services    Follow-Up: At [facility], you and your health needs are our priority.  As part of our continuing mission to provide you with exceptional heart care, we have created designated Provider Care Teams.  These Care Teams include your primary Cardiologist (physician) and Advanced Practice Providers (APPs -  Physician Assistants and Nurse Practitioners) who all work together to provide you with the care you need, when you need it.  We recommend signing up for the  patient portal called "MyChart".  Sign up information is provided on this After Visit Summary.  MyChart is used to connect with patients for Virtual Visits (Telemedicine).  Patients are able to view lab/test results, encounter notes, upcoming appointments, etc.  Non-urgent messages can be sent to your provider as well.   To learn more about what you can do with MyChart, go to [email or web address].    Your next appointment:   1 month(s)  The format for your next appointment:   In Person  Provider:   Caron Presume, PA-C

## 2020-11-28 NOTE — Telephone Encounter (Signed)
Left message for pt to call.

## 2020-11-28 NOTE — Progress Notes (Signed)
Cardiology Office Note:    Date:  11/28/2020   ID:  Curtis Weiss, DOB [birth year], MRN [id number]  PCP:  Basilio Cairo, MD [place] Cardiologist: Kirk Ruths, MD   Reason for visit: Shortness of breath and elevated heart rate  History of Present Illness:    Curtis Weiss is a 75 y.o. male with a hx of HTN, CAD s/p CABGx3 (LIMA to the LAD, RIMA to the PDA and saphenous vein graft to the diagonal) in 11/2018, AAA without rupture, previous renal Doppler with no renal artery stenosis, normal catecholamines and cortisol levels.  Hx of tobacco use 45 years ago.  He was last seen by our pharmacist, Tommy Medal on 02/2020.  There was confusion on what medications the patient was taking -this was straightened out.  Carvedilol was discontinued secondary to fatigue.  Patient had noticed increase in heart rate following this.  Dr. Stanford Breed saw as well in January 2022.  Patient mentioned dyspnea with more vigorous activities and fatigue.  No chest pain.  Recommended follow-up in 1 year.  Today, he is concerned about his dyspnea on exertion as well as increased heart rate with exertion x couple of months.  He walks 2 to 3 miles once or twice a week without symptoms.  [place] he feels short of breath and rest.  He has A. fib and rest after walking up to the green when he is playing golf.  He also has to rest after doing 1 flight of stairs.  He feels like he has to breathe deeper to expand his chest.  He denies the chest pressure he had prior to CABG.  He wonders though if he has another artery blockage.  He denies heart failure symptoms including PND, orthopnea and lower extremity edema.  He mentions that he has a thyroid nodule with failed biopsy x2.  He is following up next week.  He has had 20 pound weight loss last 2 months and states he is eating less/not as hungry.  Denies bloating.  He denies palpitations.  He states he gets a headache if his blood pressure is high.  Has some rare  lightheadedness.  No syncope.  No bleeding issues.  He has a recent runny nose and some cough.  No fever.  States he had COVID 6 months ago.        Past Medical History:  Diagnosis Date   AAA (abdominal aortic aneurysm) without rupture    Chronic insomnia    Herniated lumbar intervertebral disc    Hypertension    Idiopathic peripheral neuropathy    Pure hypercholesterolemia     Past Surgical History:  Procedure Laterality Date   Arm surgery     CLIPPING OF ATRIAL APPENDAGE N/A 11/30/2018   Procedure: Clipping Of Atrial Appendage - using AtriCure clip size 46mm;  Surgeon: Wonda Olds, MD;  Location: [facility];  Service: Open Heart Surgery;  Laterality: N/A;   CORONARY ARTERY BYPASS GRAFT N/A 11/30/2018   Procedure: CORONARY ARTERY BYPASS GRAFTING (CABG) x three, using bilateral mammary arteries and right leg greater saphenous vein harvested endoscopically;  Surgeon: Wonda Olds, MD;  Location: [facility];  Service: Open Heart Surgery;  Laterality: N/A;   LEFT HEART CATH AND CORONARY ANGIOGRAPHY N/A 11/26/2018   Procedure: LEFT HEART CATH AND CORONARY ANGIOGRAPHY;  Surgeon: Leonie Man, MD;  Location: [facility];  Service: Cardiovascular;  Laterality: N/A;   SHOULDER SURGERY     TEE WITHOUT CARDIOVERSION N/A  11/30/2018   Procedure: TRANSESOPHAGEAL ECHOCARDIOGRAM (TEE);  Surgeon: Wonda Olds, MD;  Location: [facility];  Service: Open Heart Surgery;  Laterality: N/A;    Current Medications: Current Meds  Medication Sig   allopurinol (ZYLOPRIM) 100 MG tablet Take 100 mg by mouth daily.   amLODipine (NORVASC) 10 MG tablet TAKE ONE TABLET BY MOUTH DAILY   aspirin EC 81 MG tablet Take 81 mg by mouth daily.   atorvastatin (LIPITOR) 80 MG tablet TAKE ONE TABLET BY MOUTH DAILY AT 6PM   carvedilol (COREG) 6.25 MG tablet TAKE ONE TABLET BY MOUTH TWICE A DAY   colchicine 0.6 MG tablet TAKE ONE TABLET BY MOUTH DAILY   eplerenone (INSPRA) 50 MG tablet Take 50 mg by mouth daily.    ezetimibe (ZETIA) 10 MG tablet Take 10 mg by mouth daily.   hydrALAZINE (APRESOLINE) 25 MG tablet TAKE ONE TABLET BY MOUTH TWICE A DAY   hydrochlorothiazide (HYDRODIURIL) 25 MG tablet Take 1 tablet (25 mg total) by mouth daily.   Multiple Vitamins-Minerals (MULTIVITAMIN PO) Take 1 tablet by mouth daily.   quinapril (ACCUPRIL) 20 MG tablet Take 1 tablet (20 mg total) by mouth at bedtime.   Current Facility-Administered Medications for the 11/28/20 encounter (Office Visit) with Warren Lacy, PA-C  Medication   sodium chloride flush (NS) 0.9 % injection 3 mL     Allergies:   Patient has no known allergies.   Social History   Socioeconomic History   Marital status: Married    Spouse name: Not on file   Number of children: 3   Years of education: Not on file   Highest education level: Not on file  Occupational History    Comment: Retired  Tobacco Use   Smoking status: Former   Smokeless tobacco: Never  Substance and Sexual Activity   Alcohol use: Yes    Comment: 14   Drug use: Not on file   Sexual activity: Not on file  Other Topics Concern   Not on file  Social History Narrative   Not on file   Social Determinants of Health   Financial Resource Strain: Not on file  Food Insecurity: Not on file  Transportation Needs: Not on file  Physical Activity: Not on file  Stress: Not on file  Social Connections: Not on file     Family History: The patient's family history includes Hypertension in his father and mother.  ROS:   Please see the history of present illness.     EKGs/Labs/Other Studies Reviewed:    EKG:  The ekg ordered today demonstrates normal sinus rhythm, no ST changes from January 2022.  Heart rate 69, PR interval 178 ms, QRS duration 78 ms.  Recent Labs: No results found for requested labs within last 8760 hours.   Recent Lipid Panel Lab Results  Component Value Date/Time   CHOL 114 06/16/2019 08:52 AM   TRIG 175 (H) 06/16/2019 08:52 AM   HDL  38 (L) 06/16/2019 08:52 AM   LDLCALC 47 06/16/2019 08:52 AM    Physical Exam:    VS:  BP (!) 112/58 (BP Location: Right Arm, Patient Position: Sitting, Cuff Size: Normal)   Pulse 69   Ht 6' (1.829 m)   Wt 196 lb (88.9 kg)   BMI 26.58 kg/m    No data found.  Wt Readings from Last 3 Encounters:  11/28/20 196 lb (88.9 kg)  02/14/20 207 lb (93.9 kg)  02/09/20 215 lb 12.8 oz (97.9 kg)  GEN:  Well nourished, well developed in no acute distress HEENT: Normal NECK: No JVD; No carotid bruits CARDIAC: RRR, no murmurs, rubs, gallops RESPIRATORY:  Clear to auscultation without rales, wheezing or rhonchi  ABDOMEN: Soft, non-tender, non-distended MUSCULOSKELETAL: No edema; No deformity  SKIN: Warm and dry NEUROLOGIC:  Alert and oriented PSYCHIATRIC:  Normal affect     ASSESSMENT AND PLAN   Shortness of breath -Concerned for anginal equivalent. -Prefer proceeding with LHC as I would not trust the results of the negative stress test. -Discussed case with doctor of the day, Dr. Harriet Masson who agrees with this plan. -No signs of heart failure, prior echo with normal EF and no significant valve disease.  We will hold off on echo for now.  Coronary artery disease status post coronary artery bypass graft -hx of CABGx3 (LIMA to the LAD, RIMA to the PDA and saphenous vein graft to the diagonal) in 11/2018 -Continue with aspirin and statin therapy.   -Off beta-blocker secondary to fatigue.  Hypertension, well controlled -BP may be improved with recent weight loss.  Continue current medications. -He has a follow-up next week with our pharmacist for blood pressure management. -Goal BP is <130/80.  Recommend DASH diet (high in vegetables, fruits, low-fat dairy products, whole grains, poultry, fish, and nuts and low in sweets, sugar-sweetened beverages, and red meats), salt restriction and increase physical activity.  Hyperlipidemia -LDL 47 in May 2021.  Recommend rechecking lipids in the near  future. -Discussed cholesterol lowering diets - Mediterranean diet, DASH diet, vegetarian diet, low-carbohydrate diet and avoidance of trans fats.  Discussed healthier choice substitutes.  Nuts, high-fiber foods, and fiber supplements may also improve lipids.    Abdominal aortic aneurysm -AAA duplex September 2022: stable AAA measuring 3.2 x 3.2 cm.  Repeat study in 1 year.  Disposition - Follow-up in 1 month.  If heart catheterization is unremarkable, then will consider echo and chest x-ray and other differential for his shortness of breath.   Shared Decision Making/Informed Consent The risks [stroke (1 in 1000), death (1 in 1000), kidney failure [usually temporary] (1 in 500), bleeding (1 in 200), allergic reaction [possibly serious] (1 in 200)], benefits (diagnostic support and management of coronary artery disease) and alternatives of a cardiac catheterization were discussed in detail with Curtis Weiss and he is willing to proceed.    Medication Adjustments/Labs and Tests Ordered: Current medicines are reviewed at length with the patient today.  Concerns regarding medicines are outlined above.  Orders Placed This Encounter  Procedures   Basic metabolic panel   CBC   EKG 12-Lead   No orders of the defined types were placed in this encounter.   Patient Instructions  Medication Instructions:  No Changes *If you need a refill on your cardiac medications before your next appointment, please call your pharmacy*   Lab Work: BMET, CBC Today If you have labs (blood work) drawn today and your tests are completely normal, you will receive your results only by: [place] (if you have MyChart) OR A paper copy in the mail If you have any lab test that is abnormal or we need to change your treatment, we will call you to review the results.   Testing/Procedures:  [facility]  [place] [address] Dept: [phone] Loc: [phone]  IRL BODIE  11/28/2020  You are scheduled for a Cardiac Catheterization on Tuesday, November 1 with Dr. [name].  1. Please  arrive at the [facility] (Main Entrance A) at [facility]: [address] [place], [place] [address] at 10:00 AM (This time is two hours before your procedure to ensure your preparation). Free valet parking service is available.   Special note: Every effort is made to have your procedure done on time. Please understand that emergencies sometimes delay scheduled procedures.  2. Diet: Do not eat solid foods after midnight.  The patient may have clear liquids until 5am upon the day of the procedure.  3. Labs: You will need to have blood drawn on Wednesday, October 26 at [place], [place]  Open: [place] (Lunch 12:30 - 1:30)   Phone: [phone]. You do not need to be fasting.  4. Medication instructions in preparation for your procedure:   Contrast Allergy: No    Current Outpatient Medications (Cardiovascular):    amLODipine (NORVASC) 10 MG tablet, TAKE ONE TABLET BY MOUTH DAILY   atorvastatin (LIPITOR) 80 MG tablet, TAKE ONE TABLET BY MOUTH DAILY AT 6PM   carvedilol (COREG) 6.25 MG tablet, TAKE ONE TABLET BY MOUTH TWICE A DAY   eplerenone (INSPRA) 50 MG tablet, Take 50 mg by mouth daily.   ezetimibe (ZETIA) 10 MG tablet, Take 10 mg by mouth daily.   hydrALAZINE (APRESOLINE) 25 MG tablet, TAKE ONE TABLET BY MOUTH TWICE A DAY   hydrochlorothiazide (HYDRODIURIL) 25 MG tablet, Take 1 tablet (25 mg total) by mouth daily.   quinapril (ACCUPRIL) 20 MG tablet, Take 1 tablet (20 mg total) by mouth at bedtime.   Current Outpatient Medications (Analgesics):    allopurinol (ZYLOPRIM) 100 MG tablet, Take 100 mg by mouth daily.   aspirin EC 81 MG tablet, Take 81 mg by mouth daily.   colchicine 0.6 MG tablet, TAKE ONE TABLET BY MOUTH DAILY   Current Outpatient  Medications (Other):    Multiple Vitamins-Minerals (MULTIVITAMIN PO), Take 1 tablet by mouth daily. *For reference purposes while preparing patient instructions.   Delete this med list prior to printing instructions for patient.*     On the morning of your procedure, take your Aspirin and any morning medicines NOT listed above.  You may use sips of water.  5. Plan for one night stay--bring personal belongings. 6. Bring a current list of your medications and current insurance cards. 7. You MUST have a responsible person to drive you home. 8. Someone MUST be with you the first 24 hours after you arrive home or your discharge will be delayed. 9. Please wear clothes that are easy to get on and off and wear slip-on shoes.  Thank you for allowing [place] to care for you!   -- [facility]    Follow-Up: At [facility], you and your health needs are our priority.  As part of our continuing mission to provide you with exceptional heart care, we have created designated Provider Care Teams.  These Care Teams include your primary Cardiologist (physician) and Advanced Practice Providers (APPs -  Physician Assistants and Nurse Practitioners) who all work together to provide you with the care you need, when you need it.  We recommend signing up for the patient portal called "MyChart".  Sign up information is provided on this After Visit Summary.  MyChart is used to connect with patients for Virtual Visits (Telemedicine).  Patients are able to view lab/test results, encounter notes, upcoming appointments, etc.  Non-urgent messages can be sent to your provider as well.   To  learn more about what you can do with MyChart, go to [email or web address].    Your next appointment:   1 month(s)  The format for your next appointment:   In Person  Provider:   Caron Presume, PA-C       Signed, Warren Lacy, PA-C  11/28/2020 12:58 PM    Curtis Weiss

## 2020-11-29 ENCOUNTER — Telehealth: Payer: Self-pay

## 2020-11-29 LAB — CBC
Hematocrit: 41.3 % (ref 37.5–51.0)
Hemoglobin: 14.6 g/dL (ref 13.0–17.7)
MCH: 34.2 pg — ABNORMAL HIGH (ref 26.6–33.0)
MCHC: 35.4 g/dL (ref 31.5–35.7)
MCV: 97 fL (ref 79–97)
Platelets: 201 10*3/uL (ref 150–450)
RBC: 4.27 x10E6/uL (ref 4.14–5.80)
RDW: 12.1 % (ref 11.6–15.4)
WBC: 7.4 10*3/uL (ref 3.4–10.8)

## 2020-11-29 LAB — BASIC METABOLIC PANEL
BUN/Creatinine Ratio: 12 (ref 10–24)
BUN: 14 mg/dL (ref 8–27)
CO2: 23 mmol/L (ref 20–29)
Calcium: 9.9 mg/dL (ref 8.6–10.2)
Chloride: 101 mmol/L (ref 96–106)
Creatinine, Ser: 1.2 mg/dL (ref 0.76–1.27)
Glucose: 169 mg/dL — ABNORMAL HIGH (ref 70–99)
Potassium: 5.1 mmol/L (ref 3.5–5.2)
Sodium: 140 mmol/L (ref 134–144)
eGFR: 63 mL/min/{1.73_m2} (ref >59)

## 2020-11-29 NOTE — Telephone Encounter (Signed)
Patient aware of results and verbalized understanding.    Patient unsure which medications to hold prior to his cardiac catheterization-advised to hold HCTZ and eplerenone AM of procedure.   Patient verbalized understanding.

## 2020-11-29 NOTE — Telephone Encounter (Signed)
Pt is returning a call to [place].

## 2020-11-29 NOTE — Telephone Encounter (Addendum)
Left message for patient to call office.----- Message from Warren Lacy, PA-C sent at 11/29/2020 12:13 PM EDT ----- Kidney function, electrolytes and blood counts are all normal.

## 2020-11-29 NOTE — Telephone Encounter (Signed)
Patient returned call

## 2020-11-30 ENCOUNTER — Telehealth: Payer: Self-pay

## 2020-11-30 NOTE — Telephone Encounter (Addendum)
Spoke with patient regarding results----- Message from Warren Lacy, PA-C sent at 11/29/2020 12:13 PM EDT ----- Kidney function, electrolytes and blood counts are all normal.

## 2020-12-03 ENCOUNTER — Telehealth: Payer: Self-pay | Admitting: *Deleted

## 2020-12-03 NOTE — Telephone Encounter (Addendum)
Cardiac catheterization scheduled at [facility] for: Tuesday December 04, 2020 [facility] Main Entrance A [facility]) at: 10 AM   No solid food after midnight prior to cath, clear liquids until 5 AM day of procedure.  Medication instructions: Hold: HCTZ-AM of procedure Eplerenone-AM of procedure  Except hold medications usual morning medications can be taken pre-cath with sips of water including aspirin 81 mg.    Confirmed patient has responsible adult to drive home post procedure and be with patient first 24 hours after arriving home.  [facility] does allow one visitor to accompany you and wait in the hospital waiting room while you are there for your procedure. You and your visitor will be asked to wear a mask once you enter the hospital.   Patient reports does not currently have any new symptoms concerning for COVID-19 and no household members with COVID-19 like illness.    Reviewed procedure/mask/visitor instructions with patient.

## 2020-12-04 ENCOUNTER — Encounter (HOSPITAL_COMMUNITY)
Admission: RE | Disposition: A | Discharge status: Home / Self Care | Payer: Self-pay | Source: Home / Self Care | Attending: Cardiology

## 2020-12-04 ENCOUNTER — Other Ambulatory Visit: Payer: Self-pay

## 2020-12-04 ENCOUNTER — Ambulatory Visit (HOSPITAL_COMMUNITY)
Admission: RE | Admit: 2020-12-04 | Discharge: 2020-12-04 | Disposition: A | Discharge status: Home / Self Care | Payer: Medicare HMO | Attending: Cardiology | Admitting: Cardiology

## 2020-12-04 DIAGNOSIS — I25110 Atherosclerotic heart disease of native coronary artery with unstable angina pectoris: Secondary | ICD-10-CM | POA: Diagnosis present

## 2020-12-04 DIAGNOSIS — I10 Essential (primary) hypertension: Secondary | ICD-10-CM | POA: Insufficient documentation

## 2020-12-04 DIAGNOSIS — Z7982 Long term (current) use of aspirin: Secondary | ICD-10-CM | POA: Diagnosis not present

## 2020-12-04 DIAGNOSIS — Z8249 Family history of ischemic heart disease and other diseases of the circulatory system: Secondary | ICD-10-CM | POA: Insufficient documentation

## 2020-12-04 DIAGNOSIS — R0609 Other forms of dyspnea: Secondary | ICD-10-CM | POA: Diagnosis not present

## 2020-12-04 DIAGNOSIS — Z951 Presence of aortocoronary bypass graft: Secondary | ICD-10-CM | POA: Insufficient documentation

## 2020-12-04 DIAGNOSIS — R0602 Shortness of breath: Secondary | ICD-10-CM | POA: Diagnosis not present

## 2020-12-04 DIAGNOSIS — I25119 Atherosclerotic heart disease of native coronary artery with unspecified angina pectoris: Secondary | ICD-10-CM | POA: Diagnosis present

## 2020-12-04 DIAGNOSIS — I714 Abdominal aortic aneurysm, without rupture, unspecified: Secondary | ICD-10-CM | POA: Diagnosis not present

## 2020-12-04 DIAGNOSIS — Z87891 Personal history of nicotine dependence: Secondary | ICD-10-CM | POA: Diagnosis not present

## 2020-12-04 DIAGNOSIS — Z79899 Other long term (current) drug therapy: Secondary | ICD-10-CM | POA: Insufficient documentation

## 2020-12-04 DIAGNOSIS — Z8616 Personal history of COVID-19: Secondary | ICD-10-CM | POA: Diagnosis not present

## 2020-12-04 HISTORY — PX: LEFT HEART CATH AND CORS/GRAFTS ANGIOGRAPHY: CATH118250

## 2020-12-04 SURGERY — LEFT HEART CATH AND CORS/GRAFTS ANGIOGRAPHY
Anesthesia: LOCAL

## 2020-12-04 MED ORDER — LABETALOL HCL 5 MG/ML IV SOLN: 10.0000 mg | INTRAVENOUS | Status: DC | PRN

## 2020-12-04 MED ORDER — LIDOCAINE HCL (PF) 1 % IJ SOLN
INTRAMUSCULAR | Status: AC
  Filled 2020-12-04: qty 30

## 2020-12-04 MED ORDER — FENTANYL CITRATE (PF) 100 MCG/2ML IJ SOLN
INTRAMUSCULAR | Status: AC
  Filled 2020-12-04: qty 2

## 2020-12-04 MED ORDER — SODIUM CHLORIDE 0.9% FLUSH: 3.0000 mL | Freq: Two times a day (BID) | INTRAVENOUS | Status: DC

## 2020-12-04 MED ORDER — HYDRALAZINE HCL 20 MG/ML IJ SOLN: 10.0000 mg | INTRAMUSCULAR | Status: DC | PRN

## 2020-12-04 MED ORDER — LIDOCAINE HCL (PF) 1 % IJ SOLN
INTRAMUSCULAR | Status: DC | PRN
  Administered 2020-12-04: 18 mL

## 2020-12-04 MED ORDER — MIDAZOLAM HCL 2 MG/2ML IJ SOLN
INTRAMUSCULAR | Status: DC | PRN
  Administered 2020-12-04: 1 mg via INTRAVENOUS

## 2020-12-04 MED ORDER — SODIUM CHLORIDE 0.9 % WEIGHT BASED INFUSION: 1.0000 mL/kg/h | INTRAVENOUS | Status: DC

## 2020-12-04 MED ORDER — SODIUM CHLORIDE 0.9 % IV SOLN: 250.0000 mL | INTRAVENOUS | Status: DC | PRN

## 2020-12-04 MED ORDER — SODIUM CHLORIDE 0.9% FLUSH: 3.0000 mL | INTRAVENOUS | Status: DC | PRN

## 2020-12-04 MED ORDER — HEPARIN (PORCINE) IN NACL 1000-0.9 UT/500ML-% IV SOLN
INTRAVENOUS | Status: DC | PRN
  Administered 2020-12-04 (×2): 500 mL

## 2020-12-04 MED ORDER — HEPARIN (PORCINE) IN NACL 1000-0.9 UT/500ML-% IV SOLN
INTRAVENOUS | Status: AC
  Filled 2020-12-04: qty 500

## 2020-12-04 MED ORDER — HEPARIN SODIUM (PORCINE) 1000 UNIT/ML IJ SOLN
INTRAMUSCULAR | Status: AC
  Filled 2020-12-04: qty 1

## 2020-12-04 MED ORDER — ONDANSETRON HCL 4 MG/2ML IJ SOLN: 4.0000 mg | Freq: Four times a day (QID) | INTRAMUSCULAR | Status: DC | PRN

## 2020-12-04 MED ORDER — ACETAMINOPHEN 325 MG PO TABS: 650.0000 mg | ORAL_TABLET | ORAL | Status: DC | PRN

## 2020-12-04 MED ORDER — VERAPAMIL HCL 2.5 MG/ML IV SOLN
INTRAVENOUS | Status: AC
  Filled 2020-12-04: qty 2

## 2020-12-04 MED ORDER — MIDAZOLAM HCL 2 MG/2ML IJ SOLN
INTRAMUSCULAR | Status: AC
  Filled 2020-12-04: qty 2

## 2020-12-04 MED ORDER — FENTANYL CITRATE (PF) 100 MCG/2ML IJ SOLN
INTRAMUSCULAR | Status: DC | PRN
  Administered 2020-12-04: 25 ug via INTRAVENOUS

## 2020-12-04 MED ORDER — SODIUM CHLORIDE 0.9 % IV SOLN: INTRAVENOUS | Status: AC

## 2020-12-04 MED ORDER — SODIUM CHLORIDE 0.9 % WEIGHT BASED INFUSION
3.0000 mL/kg/h | INTRAVENOUS | Status: DC
  Administered 2020-12-04: 3 mL/kg/h via INTRAVENOUS

## 2020-12-04 SURGICAL SUPPLY — 12 items
CATH INFINITI 5 FR IM (CATHETERS) ×2 IMPLANT
CATH INFINITI 5 FR MPA2 (CATHETERS) ×2 IMPLANT
CATH INFINITI 5FR MULTPACK ANG (CATHETERS) ×2 IMPLANT
CATH INFINITI JR4 5F (CATHETERS) ×2 IMPLANT
KIT HEART LEFT (KITS) ×2 IMPLANT
KIT MICROPUNCTURE NIT STIFF (SHEATH) ×2 IMPLANT
PACK CARDIAC CATHETERIZATION (CUSTOM PROCEDURE TRAY) ×2 IMPLANT
SHEATH PROBE COVER 6X72 (BAG) ×2 IMPLANT
TRANSDUCER W/STOPCOCK (MISCELLANEOUS) ×2 IMPLANT
TUBING CIL FLEX 10 FLL-RA (TUBING) ×2 IMPLANT
WIRE EMERALD 3MM-J .035X150CM (WIRE) ×2 IMPLANT
WIRE EMERALD 3MM-J .035X260CM (WIRE) ×2 IMPLANT

## 2020-12-04 NOTE — Interval H&P Note (Signed)
History and Physical Interval Note:  12/04/2020 2:00 PM  Curtis Weiss  has presented today for surgery, with the diagnosis of angina.  The various methods of treatment have been discussed with the patient and family. After consideration of risks, benefits and other options for treatment, the patient has consented to  Procedure(s): LEFT HEART CATH AND CORS/GRAFTS ANGIOGRAPHY (N/A)  PERCUTANEOUS CORONARY INTERVENTION  as a surgical intervention.  The patient's history has been reviewed, patient examined, no change in status, stable for surgery.  I have reviewed the patient's chart and labs.  Questions were answered to the patient's satisfaction.    Cath Lab Visit (complete for each Cath Lab visit)  Clinical Evaluation Leading to the Procedure:   ACS: No.  Non-ACS:    Anginal Classification: CCS III -symptoms noted as exertional dyspnea  Anti-ischemic medical therapy: Maximal Therapy (2 or more classes of medications)  Non-Invasive Test Results: No non-invasive testing performed  Prior CABG: No previous CABG   Curtis Weiss

## 2020-12-04 NOTE — Progress Notes (Signed)
Site Area:  RFA x 1 Site prior to removal: Level  0 Pressure applied for:  20 minutes Manual:  yes Pt status during pull:  stable Post pull site:  level 0  Post pull instructions given:  yes Post pull pulses present:  PT Dressing applied:  gauze/tegaderm Bedrest begins @:  16:20 Comments: Removed by Lula Olszewski RN

## 2020-12-04 NOTE — Brief Op Note (Addendum)
    Brief Cardiac Catheterization Report     12/04/2020 3:28 PM  PCP: Cena Benton, MD Cardiologist: Dr. Stanford Breed, Caron Presume, PA   PROCEDURE:  Procedure(s): LEFT HEART CATH AND CORS/GRAFTS ANGIOGRAPHY (N/A)   PATIENT:  Curtis Weiss  75 y.o. male status post CABG x3 in 2020 with LIMA-LAD, pedicled RIMA-RCA, SVG-diagonal for severe RCA and bifurcation LAD had back disease.  He has been evaluated in the clinic for significant exertional dyspnea concerning for possible angina.  He is therefore referred for invasive evaluation with cardiac catheterization and possible PCI.  PRE-OPERATIVE DIAGNOSIS:  angina  POST-OPERATIVE DIAGNOSIS:   Ostial LM 20% focal Heavily diseased proximal to mid LAD with essentially subtotal occlusion prior to bifurcation and competitive flow noted after 90% stenosis just distal to the bifurcation there is a 90% ostial D1 lesion as well with competitive flow. Minimal disease in the native LCx and high OM. ->   Heavily diseased native RCA - 99% subtotal occlusion in the mid vessel after the first bend followed by severe 95% distal mid vessel with competitive flow in the distal RCA-<RPDA, RPL Normal LVEDP with essentially normal LVEF (for imaging)   Time Out: Verified patient identification, verified procedure, site/side was marked, verified correct patient position, special equipment/implants available, medications/allergies/relevent history reviewed, required imaging and test results available. Performed.  Access:  * RIGHT Common Femoral Artery: 5 Fr Sheath - fluoroscopically guided modified Seldinger Technique  --Direct ultrasound guidance used.  Image obtained and placed on chart.  Left Heart Catheterization: 5Fr Catheters advanced or exchanged over a J-wire under direct fluoroscopic guidance into the ascending aorta; JL4 catheter advanced first.  * LV Hemodynamics (LV Gram): JR4 catheter * Left Coronary Artery Cineangiography: JL 4 catheter   * Right Coronary Artery & SVG-D1 Cineangiography: JR4 catheter  * LIMA-LAD Cineangiography: JR4 catheter was redirected into first the left followed by right subclavian Artery & exchanged over long-exchange wire for IMA catheter for nonselective LIMA and selective/nonselective RIMA angiography..  Review of initial angiography revealed: Widely patent grafts with occluded native vessels appropriately.  No obvious culprit lesion.  Normal EDP.  Preparations are made for optimization of medical management.  Upon completion of Angiogaphy, the catheter was removed completely out of the body over a wire, without complication.  Femoral Sheath(s) removed in the PACU holding area with manual pressure for hemostasis.    MEDICATIONS SQ Lidocaine 14 mL Radial Cocktail: 3 mg Verapmil in 10 mL NS ANESTHESIA:   local and IV sedation; 14 mL lidocaine, 1 mg IV Versed, 25 mg IV fentanyl  EBL:  <25 mL  DICTATION: .Note written in EPIC  PLAN OF CARE: Discharge to home after PACU  PATIENT DISPOSITION:  PACU - hemodynamically stable. Continue to address nonanginal etiology for dyspnea. Recommend checking 2D echocardiogram and consider event monitor since he notes significant tachycardia with exertion (sounds out of proportion to his level of exertion)   Delay start of Pharmacological VTE agent (>24hrs) due to surgical blood loss or risk of bleeding: not applicable    [name], MD

## 2020-12-05 ENCOUNTER — Ambulatory Visit (INDEPENDENT_AMBULATORY_CARE_PROVIDER_SITE_OTHER): Payer: Medicare HMO

## 2020-12-05 ENCOUNTER — Telehealth: Payer: Self-pay | Admitting: *Deleted

## 2020-12-05 ENCOUNTER — Encounter (HOSPITAL_COMMUNITY): Payer: Self-pay | Admitting: Cardiology

## 2020-12-05 DIAGNOSIS — R0602 Shortness of breath: Secondary | ICD-10-CM

## 2020-12-05 DIAGNOSIS — R002 Palpitations: Secondary | ICD-10-CM

## 2020-12-05 NOTE — Telephone Encounter (Signed)
12/05/20--Lambert, Oletta Darter, PA-C  Raiford Simmonds, RN Let's set up the patient for:  -Echo for shortness of breath  -Zio patch for tachycardia with exertion, palpitations  -Chest x-ray for shortness of breath   Thanks Ivin Booty!  Jenn    Order placed , per verbal order--  will notify patient  once he  is discharge from  today's procedure

## 2020-12-05 NOTE — Telephone Encounter (Signed)
Open error 

## 2020-12-05 NOTE — Progress Notes (Unsigned)
Enrolled patient for a 14 day Zio XT monitor to be mailed to patients home  [place] to read 

## 2020-12-06 ENCOUNTER — Other Ambulatory Visit: Payer: Self-pay

## 2020-12-06 ENCOUNTER — Ambulatory Visit: Payer: Medicare HMO | Admitting: Pharmacist Clinician (PhC)/ Clinical Pharmacy Specialist

## 2020-12-06 DIAGNOSIS — I10 Essential (primary) hypertension: Secondary | ICD-10-CM

## 2020-12-06 NOTE — Telephone Encounter (Signed)
Please Advise

## 2020-12-06 NOTE — Progress Notes (Signed)
Patient ID: Curtis Weiss                 DOB: 25-Apr-1945                      MRN: [id number]     HPI: Curtis Weiss is a 75 y.o. male referred by Dr. Stanford Weiss, with a PMH significant for HTN, CAD, AAA without rupture, & is s/p CABGx3 in 11/2018 presenting today to the HTN clinic for follow up.  We originally worked with patient about a year ago and were able to stabilize his blood pressure on a combination of quinapril hctz, carvedilol, amlodipine and spironolactone.   We then saw him earlier this year and he was stabilized on quinapril/hctz, hydralazine, eplerenone, and amlodipine.  Carvedilol had been discontinued due to fatigue and spironolactone due to gynecomastia.    Today he returns with his wife, with complaints of ongoing gynecomastia as well as DOE.   He recently had a heart cath, and while it did indication progression of atherosclerotic disease, it was recommended that they look for non-anginal causes of his DOE.  He is currently scheduled to have echocardiogram and wear a Zio patch.    Current HTN meds: Quinapril 20 mg qd Hctz 25 mg qd Amlodipine 10 mg qpm Hydralazine 25 mg bid - started 02/09/20 Eplerenone 50 mg qd - switched from spironolactone by PCP d/t gynecomastia  Previously tried:  Irbesartan Chlorthalidone metoprolol Lisinopril 20mg  daily - hypotension (shortly after open heart surgery) Hydralazine 50mg  bid - stopped d/t headaches (though headaches continued after d/c) Spironolactone - gynecomastia   BP goal: <130/80  Family History:  Mother - HTN (deceased at 8 yo) Father - died at 67 after fall and broken hip Brother - CABG Sister - Lung cancer (deceased) Daughter - HTN  Social History: Former smoker (quit 40 yrs ago), 1-2 drinks daily, drinks 1 coffee in the morning but not everyday  Diet: mostly home cooked meals, no added salt  Exercise: Walks 2-2.5 miles some days, golfs   Home BP readings: brings in list of last 19 days, average 134/71   Wt  Readings from Last 3 Encounters:  12/06/20 202 lb 9.6 oz (91.9 kg)  12/04/20 191 lb (86.6 kg)  11/28/20 196 lb (88.9 kg)   BP Readings from Last 3 Encounters:  12/06/20 (!) 132/58  12/04/20 (!) 141/73  11/28/20 (!) 112/58   Pulse Readings from Last 3 Encounters:  12/06/20 60  12/04/20 (!) 56  11/28/20 69    Renal function: Estimated Creatinine Clearance: 58.4 mL/min (by C-G formula based on SCr of 1.2 mg/dL).  Past Medical History:  Diagnosis Date   AAA (abdominal aortic aneurysm) without rupture    Chronic insomnia    Herniated lumbar intervertebral disc    Hypertension    Idiopathic peripheral neuropathy    Pure hypercholesterolemia     Current Outpatient Medications on File Prior to Visit  Medication Sig Dispense Refill   allopurinol (ZYLOPRIM) 100 MG tablet Take 100 mg by mouth 2 (two) times daily.     amLODipine (NORVASC) 10 MG tablet TAKE ONE TABLET BY MOUTH DAILY 90 tablet 3   aspirin EC 81 MG tablet Take 81 mg by mouth daily.     atorvastatin (LIPITOR) 80 MG tablet TAKE ONE TABLET BY MOUTH DAILY AT 6PM 90 tablet 3   colchicine 0.6 MG tablet Take 0.6 mg by mouth daily as needed (Gout).     eplerenone (INSPRA) 50  MG tablet Take 50 mg by mouth daily.     ezetimibe (ZETIA) 10 MG tablet Take 10 mg by mouth daily.     hydrALAZINE (APRESOLINE) 25 MG tablet TAKE ONE TABLET BY MOUTH TWICE A DAY 180 tablet 3   hydrochlorothiazide (HYDRODIURIL) 25 MG tablet Take 1 tablet (25 mg total) by mouth daily. 90 tablet 3   Magnesium 500 MG CAPS Take 500 mg by mouth daily.     Multiple Vitamins-Minerals (MULTIVITAMIN PO) Take 1 tablet by mouth daily.     OVER THE COUNTER MEDICATION Take 1 tablet by mouth daily. nervive     quinapril (ACCUPRIL) 20 MG tablet Take 1 tablet (20 mg total) by mouth at bedtime. 90 tablet 3   Current Facility-Administered Medications on File Prior to Visit  Medication Dose Route Frequency Provider Last Rate Last Admin   sodium chloride flush (NS) 0.9 %  injection 3 mL  3 mL Intravenous Q12H Caron Presume K, PA-C        No Known Allergies  Blood pressure (!) 132/58, pulse 60, height 6' (1.829 m), weight 202 lb 9.6 oz (91.9 kg).  Hypertension Patient with essential hypertension, currently doing well on home regimen, however potentially having side effects with eplerenone.  Will have him discontinue this and continue home monitoring.  Should he notice that home readings are drifting > 140/80 he will need to increase the hydralazine to 50 mg twice daily.  If this occurs, he will contact the office so that we can adjust his prescription accordingly  Tommy Medal PharmD CPP [facility] [address] [place] [id number] 12/07/2020 7:56 AM

## 2020-12-06 NOTE — Patient Instructions (Addendum)
Reach out to me via MyChart if you increase the hydralazine and I will update your prescription at the pharmacy.    Check your blood pressure at home daily (if able) and keep record of the readings.  Take your BP meds as follows:  Stop eplerenone.  If in 1-2 weeks your BP starts to trend upward, (> 140/80) then increase the hydralazine to 50 mg twice daily.    Bring all of your meds, your BP cuff and your record of home blood pressures to your next appointment.  Exercise as you're able, try to walk approximately 30 minutes per day.  Keep salt intake to a minimum, especially watch canned and prepared boxed foods.  Eat more fresh fruits and vegetables and fewer canned items.  Avoid eating in fast food restaurants.    HOW TO TAKE YOUR BLOOD PRESSURE: Rest 5 minutes before taking your blood pressure.  Don't smoke or drink caffeinated beverages for at least 30 minutes before. Take your blood pressure before (not after) you eat. Sit comfortably with your back supported and both feet on the floor (don't cross your legs). Elevate your arm to heart level on a table or a desk. Use the proper sized cuff. It should fit smoothly and snugly around your bare upper arm. There should be enough room to slip a fingertip under the cuff. The bottom edge of the cuff should be 1 inch above the crease of the elbow. Ideally, take 3 measurements at one sitting and record the average.

## 2020-12-07 ENCOUNTER — Encounter: Payer: Self-pay | Admitting: Pharmacist Clinician (PhC)/ Clinical Pharmacy Specialist

## 2020-12-07 NOTE — Assessment & Plan Note (Signed)
Patient with essential hypertension, currently doing well on home regimen, however potentially having side effects with eplerenone.  Will have him discontinue this and continue home monitoring.  Should he notice that home readings are drifting > 140/80 he will need to increase the hydralazine to 50 mg twice daily.  If this occurs, he will contact the office so that we can adjust his prescription accordingly

## 2020-12-10 ENCOUNTER — Ambulatory Visit (HOSPITAL_COMMUNITY): Payer: Medicare HMO | Attending: Physician Assistant

## 2020-12-10 ENCOUNTER — Other Ambulatory Visit: Payer: Self-pay

## 2020-12-10 DIAGNOSIS — R0602 Shortness of breath: Secondary | ICD-10-CM | POA: Diagnosis present

## 2020-12-10 LAB — ECHOCARDIOGRAM COMPLETE
Area-P 1/2: 4.49 cm2
S' Lateral: 2.8 cm

## 2020-12-10 MED ORDER — PERFLUTREN LIPID MICROSPHERE
1.0000 mL | INTRAVENOUS | Status: AC | PRN
Start: 2020-12-10 — End: 2020-12-10
  Administered 2020-12-10: 3 mL via INTRAVENOUS

## 2020-12-13 ENCOUNTER — Telehealth: Payer: Self-pay

## 2020-12-13 NOTE — Telephone Encounter (Addendum)
Called patient with results of echocardiogram.----- Message from Warren Lacy, PA-C sent at 12/13/2020 12:08 PM EST ----- Normal heart squeeze.  No significant valve disease.  Overall reassuring echocardiogram.  No structural cause for shortness of breath.

## 2020-12-29 DIAGNOSIS — R002 Palpitations: Secondary | ICD-10-CM

## 2020-12-29 DIAGNOSIS — R0602 Shortness of breath: Secondary | ICD-10-CM | POA: Diagnosis not present

## 2021-01-01 ENCOUNTER — Other Ambulatory Visit (HOSPITAL_COMMUNITY): Payer: Medicare HMO

## 2021-01-17 ENCOUNTER — Ambulatory Visit: Payer: Medicare HMO | Admitting: Physician Assistant

## 2021-01-17 ENCOUNTER — Telehealth: Payer: Self-pay

## 2021-01-17 IMAGING — DX DG CHEST 1V PORT
1 series · 1 of 1 positions shown · non-contrast
Comparison: None.

CLINICAL DATA: Left chest pain

EXAM:
PORTABLE CHEST 1 VIEW

[chest ap]
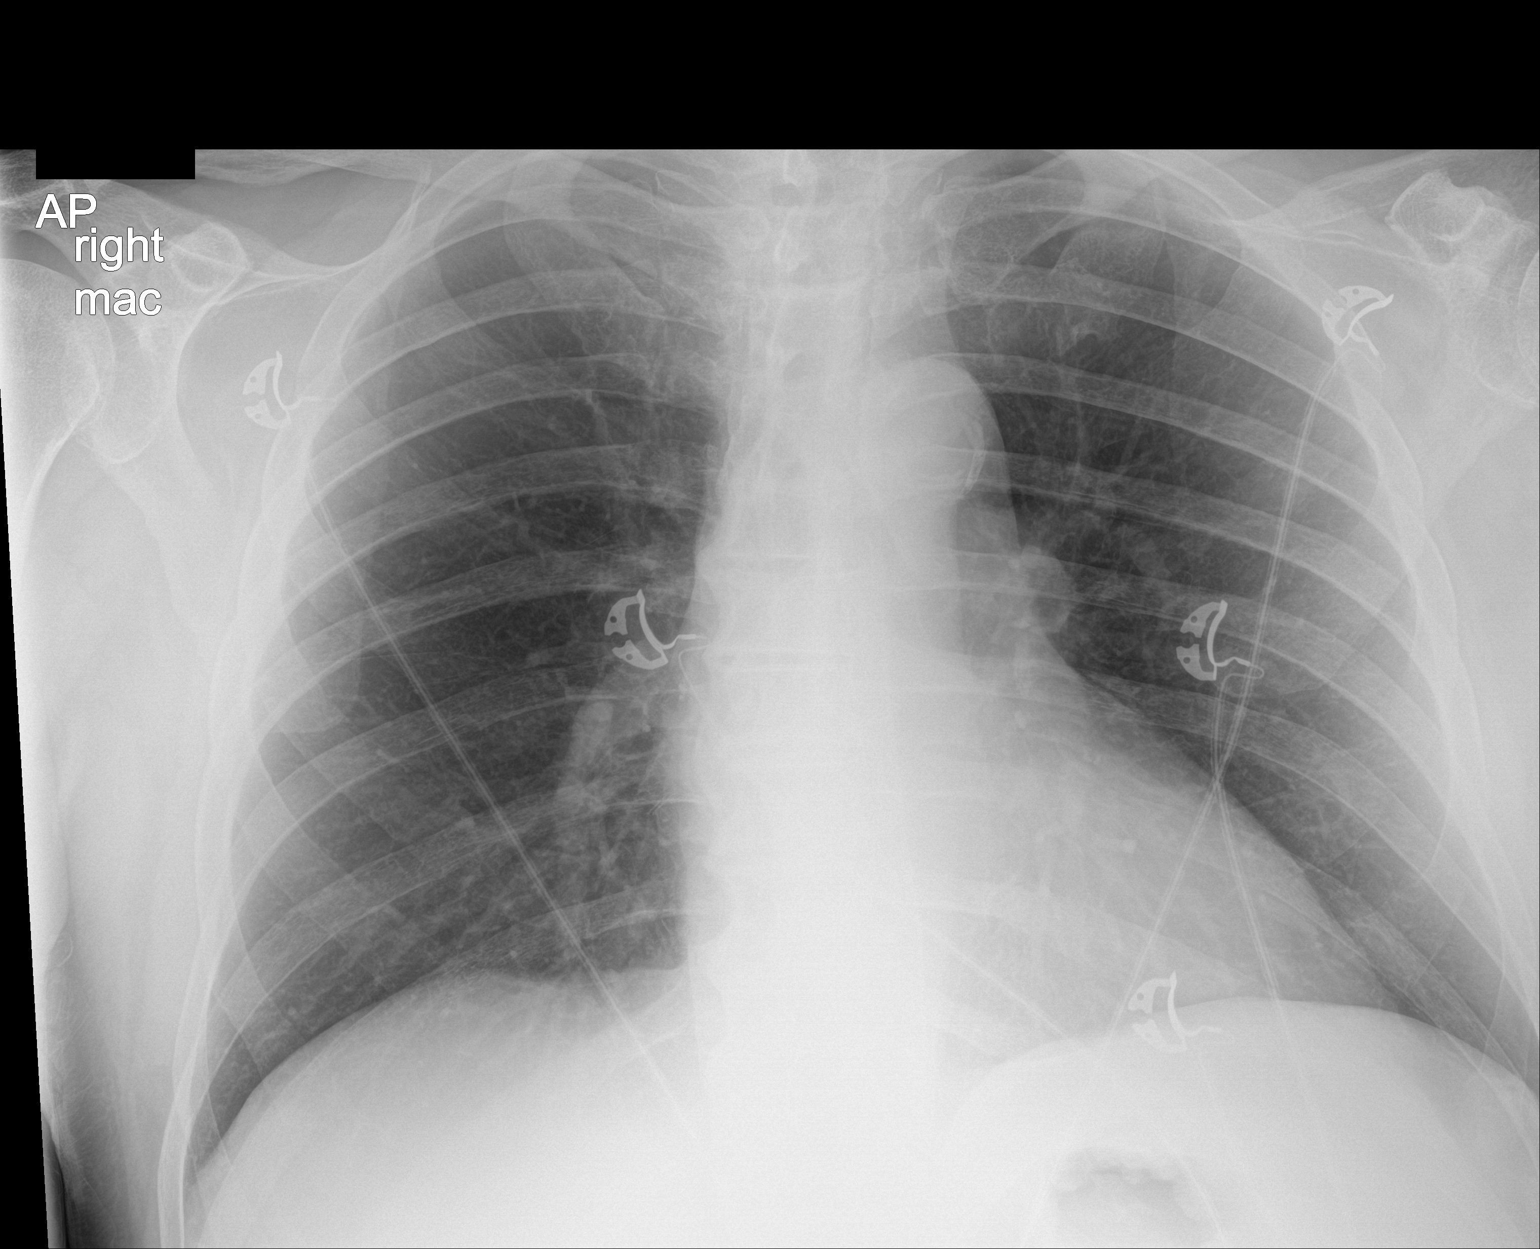

[1 of 1 positions shown; findings below may reference images not displayed]

FINDINGS: The heart size and mediastinal contours are within normal limits.
Both lungs are clear. The visualized skeletal structures are
unremarkable.
IMPRESSION: No active disease.

## 2021-01-17 NOTE — Telephone Encounter (Addendum)
Left message for patient to call office regarding monitor results.----- Message from Warren Lacy, PA-C sent at 01/16/2021  9:19 PM EST ----- No significant heart rhythm issue.  Nothing to explain shortness of breath.  Average heart rate of 61.  No A-fib noted or significant ectopy (abnormal beats).  No prolonged pauses.

## 2021-01-18 NOTE — Progress Notes (Signed)
Cardiology Office Note:    Date:  01/21/2021   ID:  Curtis Weiss, DOB 11-Aug-1945, MRN [id number]  PCP:  Deneise Lever, DO  Cardiologist:  Kirk Ruths, MD   Referring MD: Basilio Cairo, MD   Chief Complaint  Patient presents with   Follow-up    DOE    History of Present Illness:    LONN IM is a 75 y.o. male with a hx of hypertension, hyperlipidemia, AAA (3.2 cm). Hx of normal renal artery dopplers, normal catecholamines and cortisol levels.  Echo on 10/26/18 with normal EF, diastolic dysfunction, and no wall motion abnormality, but reduced GLS mentioned. Abdominal US showed AAA 3.2 cm and annual screening was recommended.   He presented to [facility] with symptoms concerning for angina. He underwent angiography 10/23/230 which revealed severe multi vessel disease and recommended TCTS consult. He underwent CABG x3 (LIMA-LAD, RIMA-PDA, SVG-diagonal branch, 11/30/18).  Since them, he has had issues with BP control and medication intolerances as well as confusion surrounding which medications he was taking.   He has struggled with dyspnea on exertion and increased heart rate. He ultimately underwent repeat angiography on 12/04/20 that showed known severe native disease with progression of disease and 3/3 patent grafts. Due to ongoing dyspnea on exertion, echo was repeated and zio patch placed.   Echo 12/10/20 showed LVEF 60-65%, normal RV function, left atria moderately dilated, and trivial MR.  Heart monitor showed no significant arrhythmias, no Afib or significant ectopy. No issues to explain SOB.  He has followed with pharmD for BP control. He has not tolerated BB due to fatigue and bradycardia. He did not tolerate spironolactone due to gynecomastia. He has been stable on:    Quinapril 20 mg qd Hctz 25 mg qd Amlodipine 10 mg qpm Hydralazine 25 mg bid - started 02/09/20  Eplerenone 50 mg qd - switched from spironolactone by PCP d/t gynecomastia   He presents for  follow up of the above studies.  Overall he has felt a little better since starting iron supplements. Hb 12/10/20 14.0. He does report continued DOE. No recent URI, but congestion. No fever or chills. He can walk with his wife, but when he goes up an incline or up stairs, he gets short of breath. This is short-lived, but still concerning to him. No chest pain, palpitations, or lower extremity swelling. No orthopnea or PND.    Past Medical History:  Diagnosis Date   AAA (abdominal aortic aneurysm) without rupture    Chronic insomnia    Herniated lumbar intervertebral disc    Hypertension    Idiopathic peripheral neuropathy    Pure hypercholesterolemia     Past Surgical History:  Procedure Laterality Date   Arm surgery     CLIPPING OF ATRIAL APPENDAGE N/A 11/30/2018   Procedure: Clipping Of Atrial Appendage - using AtriCure clip size 7mm;  Surgeon: Wonda Olds, MD;  Location: [place];  Service: Open Heart Surgery;  Laterality: N/A;   CORONARY ARTERY BYPASS GRAFT N/A 11/30/2018   Procedure: CORONARY ARTERY BYPASS GRAFTING (CABG) x three, using bilateral mammary arteries and right leg greater saphenous vein harvested endoscopically;  Surgeon: Wonda Olds, MD;  Location: [place];  Service: Open Heart Surgery;  Laterality: N/A;   LEFT HEART CATH AND CORONARY ANGIOGRAPHY N/A 11/26/2018   Procedure: LEFT HEART CATH AND CORONARY ANGIOGRAPHY;  Surgeon: Leonie Man, MD;  Location: [place];  Service: Cardiovascular;  Laterality: N/A;   LEFT HEART  CATH AND CORS/GRAFTS ANGIOGRAPHY N/A 12/04/2020   Procedure: LEFT HEART CATH AND CORS/GRAFTS ANGIOGRAPHY;  Surgeon: Leonie Man, MD;  Location: [place] [facility];  Service: Cardiovascular;  Laterality: N/A;   SHOULDER SURGERY     TEE WITHOUT CARDIOVERSION N/A 11/30/2018   Procedure: TRANSESOPHAGEAL ECHOCARDIOGRAM (TEE);  Surgeon: Wonda Olds, MD;  Location: [facility];  Service: Open Heart Surgery;  Laterality: N/A;    Current  Medications: Current Meds  Medication Sig   allopurinol (ZYLOPRIM) 100 MG tablet Take 100 mg by mouth 2 (two) times daily.   amLODipine (NORVASC) 10 MG tablet TAKE ONE TABLET BY MOUTH DAILY   aspirin EC 81 MG tablet Take 81 mg by mouth daily.   atorvastatin (LIPITOR) 80 MG tablet TAKE ONE TABLET BY MOUTH DAILY AT 6PM   colchicine 0.6 MG tablet Take 0.6 mg by mouth daily as needed (Gout).   eplerenone (INSPRA) 50 MG tablet Take 50 mg by mouth daily.   ezetimibe (ZETIA) 10 MG tablet Take 10 mg by mouth daily.   Ferrous Sulfate-C-Folic Acid 891-694-5.0 MG TBCR Take by mouth.   hydrALAZINE (APRESOLINE) 25 MG tablet TAKE ONE TABLET BY MOUTH TWICE A DAY   hydrochlorothiazide (HYDRODIURIL) 25 MG tablet Take 1 tablet (25 mg total) by mouth daily.   Magnesium 500 MG CAPS Take 500 mg by mouth daily.   Multiple Vitamins-Minerals (MULTIVITAMIN PO) Take 1 tablet by mouth daily.   OVER THE COUNTER MEDICATION Take 1 tablet by mouth daily. nervive   quinapril (ACCUPRIL) 20 MG tablet Take 1 tablet (20 mg total) by mouth at bedtime.   Current Facility-Administered Medications for the 01/21/21 encounter (Office Visit) with Ledora Bottcher, PA  Medication   sodium chloride flush (NS) 0.9 % injection 3 mL     Allergies:   Patient has no known allergies.   Social History   Socioeconomic History   Marital status: Married    Spouse name: Not on file   Number of children: 3   Years of education: Not on file   Highest education level: Not on file  Occupational History    Comment: Retired  Tobacco Use   Smoking status: Former   Smokeless tobacco: Never  Substance and Sexual Activity   Alcohol use: Yes    Comment: 14   Drug use: Not on file   Sexual activity: Not on file  Other Topics Concern   Not on file  Social History Narrative   Not on file   Social Determinants of Health   Financial Resource Strain: Not on file  Food Insecurity: Not on file  Transportation Needs: Not on file   Physical Activity: Not on file  Stress: Not on file  Social Connections: Not on file     Family History: The patient's family history includes Hypertension in his father and mother.  ROS:   Please see the history of present illness.     All other systems reviewed and are negative.  EKGs/Labs/Other Studies Reviewed:    The following studies were reviewed today:  Heart monitor 01/16/21: Sinus bradycardia, normal sinus rhythm, sinus tachycardia, occasional PAC, occasional PVC, rare ventricular couplet; Mobitz 1 second-degree AV block noted early a.m. hours.  No prolonged pauses.   Echo 12/10/20: 1. Left ventricular ejection fraction, by estimation, is 60 to 65%. The  left ventricle has normal function. The left ventricle has no regional  wall motion abnormalities. Left ventricular diastolic parameters are  indeterminate.   2. Right ventricular systolic function  is normal. The right ventricular  size is normal. There is normal pulmonary artery systolic pressure.   3. Left atrial size was mild to moderately dilated.   4. The mitral valve is normal in structure. Trivial mitral valve  regurgitation. No evidence of mitral stenosis.   5. The aortic valve is tricuspid. There is moderate calcification of the  aortic valve. There is mild thickening of the aortic valve. Aortic valve  regurgitation is not visualized. Mild to moderate aortic valve  sclerosis/calcification is present, without  any evidence of aortic stenosis.   6. The inferior vena cava is normal in size with greater than 50%  respiratory variability, suggesting right atrial pressure of 3 mmHg.   Left heart cath 12/04/20:   Ost LM to Mid LM lesion is 30% stenosed.   Ost LAD to Prox LAD lesion is 60% stenosed.  Prox LAD to Mid LAD lesion is 65% stenosed.   Mid LAD lesion is 90% stenosed with 95% (essentially subtotally occluded) stenosed side branch in 1st Diag.   Mid LAD to Dist LAD lesion is 35% stenosed.   Mid Cx lesion  is 35% stenosed.   Prox RCA-1 lesion is 70% stenosed. Prox RCA-2 lesion is 90% stenosed. Prox RCA-3 lesion is 50% stenosed.   Mid RCA lesion is 99% stenosed with 90% stenosed side branch in Acute Mrg.   -------------GRAFTS------------------   RIMA-dRCA graft was visualized by angiography and is normal in caliber. The graft exhibits no disease.  (Very difficult to engage)   LIMA-mLAD graft was visualized by non-selective angiography and is normal in caliber. The graft exhibits no disease.   SVG-1st Diag graft was visualized by angiography and is large.  The graft exhibits no disease.   ------------- HEMODYNAMICS ------------------   The left ventricular ejection fraction is 55-65% by visual estimate.   There is no aortic valve stenosis.   SUMMARY Severe native disease with essentially subtotal occlusion of the LAD-diagonal at the bifurcation as well as several places in the RCA.  This indicates progression of disease from previous catheterization. Ostial LM 20% focal Heavily diseased proximal to mid LAD with essentially subtotal occlusion prior to bifurcation and competitive flow noted after 90% stenosis just distal to the bifurcation there is a 90% ostial D1 lesion as well with competitive flow. Minimal disease in the native LCx and high OM. ->   Heavily diseased native RCA - 99% subtotal occlusion in the mid vessel after the first bend followed by severe 95% distal mid vessel with competitive flow in the distal RCA-<RPDA, RPL 3 of 3 Widely Patent Grafts: LIMA-LAD, RIMA-RCA and SVG-1stDiag Poor  imaging, would likely preserved LVEF with normal EDP     RECOMMENDATIONS Okay to discharge home today. Considered nonanginal cause for her exertional dyspnea such as arrhythmia or diastolic dysfunction dysfunction (although with LVEDP of 9 to 10 mmHg on cath, not likely) Follow-up with primary cardiologist  EKG:  EKG is  ordered today.  The ekg ordered today demonstrates sinus bradycardia with HR  52  Recent Labs: 11/28/2020: BUN 14; Creatinine, Ser 1.20; Hemoglobin 14.6; Platelets 201; Potassium 5.1; Sodium 140  Recent Lipid Panel    Component Value Date/Time   CHOL 114 06/16/2019 0852   TRIG 175 (H) 06/16/2019 0852   HDL 38 (L) 06/16/2019 0852   CHOLHDL 3.0 06/16/2019 0852   CHOLHDL 6.9 11/26/2018 0025   VLDL 51 (H) 11/26/2018 0025   LDLCALC 47 06/16/2019 0852    Physical Exam:    VS:  BP (!) 118/52    Pulse (!) 52    Ht 6' (1.829 m)    Wt 205 lb 3.2 oz (93.1 kg)    SpO2 97%    BMI 27.83 kg/m     Wt Readings from Last 3 Encounters:  01/21/21 205 lb 3.2 oz (93.1 kg)  12/06/20 202 lb 9.6 oz (91.9 kg)  12/04/20 191 lb (86.6 kg)     GEN:  Well nourished, well developed in no acute distress HEENT: Normal NECK: No JVD; No carotid bruits LYMPHATICS: No lymphadenopathy CARDIAC: RRR, no murmurs, rubs, gallops RESPIRATORY:  respirations unlabored, no crackles, reduced air movement on left ABDOMEN: Soft, non-tender, non-distended MUSCULOSKELETAL:  No edema; No deformity  SKIN: Warm and dry NEUROLOGIC:  Alert and oriented x 3 PSYCHIATRIC:  Normal affect   ASSESSMENT:    1. DOE (dyspnea on exertion)   2. Smoking history   3. Bradycardia   4. Hypertension, unspecified type   5. Coronary artery disease involving native coronary artery of native heart with unstable angina pectoris ([place])   6. S/P CABG x 3   7. Pure hypercholesterolemia   8. Abdominal aortic aneurysm (AAA) without rupture, unspecified part    PLAN:    In order of problems listed above: DOE Echocardiogram, angiography, and heart monitor were largely unrevealing - nothing cardiac to explain DOE.  Will check a CXR.  He does have a significant smoking history. He accepts pulmonology referral.    Sinus bradycardia HR in the 50-60s. This is his baseline. Heart monitor did detect second degree mobitz 1 primarily in early morning hours. He denies pre-syncope or syncope. He is on no AV node blocking agents.     Hypertension Hctz 25 mg qd Amlodipine 10 mg qpm Hydralazine 25 mg bid  Eplerenone 50 mg qd   CAD status post CABG x3 11/2018 - Continue aspirin, Plavix, 80 mg atorvastatin - no BB due to fatigue   Hyperlipidemia with LDL goal < 70 - Previously intolerant to statins  - He is doing well on Lipitor 80 mg - needs updated lipid profile - LDL in May 2021 47   AAA - duplex Sept 2022 3.2 x 3.2 cm - repeat in 1 year  Follow up with Dr. Stanford Breed in 4-5 months in [facility].    Medication Adjustments/Labs and Tests Ordered: Current medicines are reviewed at length with the patient today.  Concerns regarding medicines are outlined above.  Orders Placed This Encounter  Procedures   DG Chest 2 View   Ambulatory referral to Pulmonology   EKG 12-Lead   No orders of the defined types were placed in this encounter.   Signed, Ledora Bottcher, [place]  01/21/2021 10:55 AM    [facility]

## 2021-01-21 ENCOUNTER — Ambulatory Visit: Payer: Medicare HMO | Admitting: Physician Assistant

## 2021-01-21 ENCOUNTER — Other Ambulatory Visit: Payer: Self-pay

## 2021-01-21 ENCOUNTER — Ambulatory Visit
Admission: RE | Admit: 2021-01-21 | Discharge: 2021-01-21 | Disposition: A | Discharge status: Home / Self Care | Payer: Medicare HMO | Source: Ambulatory Visit | Attending: Physician Assistant | Admitting: Physician Assistant

## 2021-01-21 ENCOUNTER — Encounter: Payer: Self-pay | Admitting: Physician Assistant

## 2021-01-21 VITALS — BP 118/52 | HR 52 | Ht 72.0 in | Wt 205.2 lb

## 2021-01-21 DIAGNOSIS — I25110 Atherosclerotic heart disease of native coronary artery with unstable angina pectoris: Secondary | ICD-10-CM

## 2021-01-21 DIAGNOSIS — I10 Essential (primary) hypertension: Secondary | ICD-10-CM

## 2021-01-21 DIAGNOSIS — Z951 Presence of aortocoronary bypass graft: Secondary | ICD-10-CM

## 2021-01-21 DIAGNOSIS — R0609 Other forms of dyspnea: Secondary | ICD-10-CM

## 2021-01-21 DIAGNOSIS — E7800 Pure hypercholesterolemia, unspecified: Secondary | ICD-10-CM

## 2021-01-21 DIAGNOSIS — Z87891 Personal history of nicotine dependence: Secondary | ICD-10-CM

## 2021-01-21 DIAGNOSIS — R001 Bradycardia, unspecified: Secondary | ICD-10-CM

## 2021-01-21 DIAGNOSIS — I714 Abdominal aortic aneurysm, without rupture, unspecified: Secondary | ICD-10-CM

## 2021-01-21 NOTE — Patient Instructions (Addendum)
Medication Instructions:  Your physician recommends that you continue on your current medications as directed. Please refer to the Current Medication list given to you today.  *If you need a refill on your cardiac medications before your next appointment, please call your pharmacy*   Lab Work: None ordered   If you have labs (blood work) drawn today and your tests are completely normal, you will receive your results only by: [place] (if you have MyChart) OR A paper copy in the mail If you have any lab test that is abnormal or we need to change your treatment, we will call you to review the results.   Testing/Procedures: Your physician recommends that you have a chest x-ray   Chest X-ray Instructions:    1. You may have this done at the [facility], located in the [place] on the 1st floor.    2. You do no have to have an appointment.    3. [place], [place] [address]        [phone]        Monday - Friday  8:00 am - 5:00 pm    Follow-Up: Follow up as scheduled }   Your physician has referred you to [facility]   Other Instructions None

## 2021-01-25 IMAGING — CR DG CHEST 2V
2 series · 2 of 2 positions shown · non-contrast
Comparison: 12/02/2018

CLINICAL DATA: CABG.

EXAM:
CHEST - 2 VIEW

[chest pa]
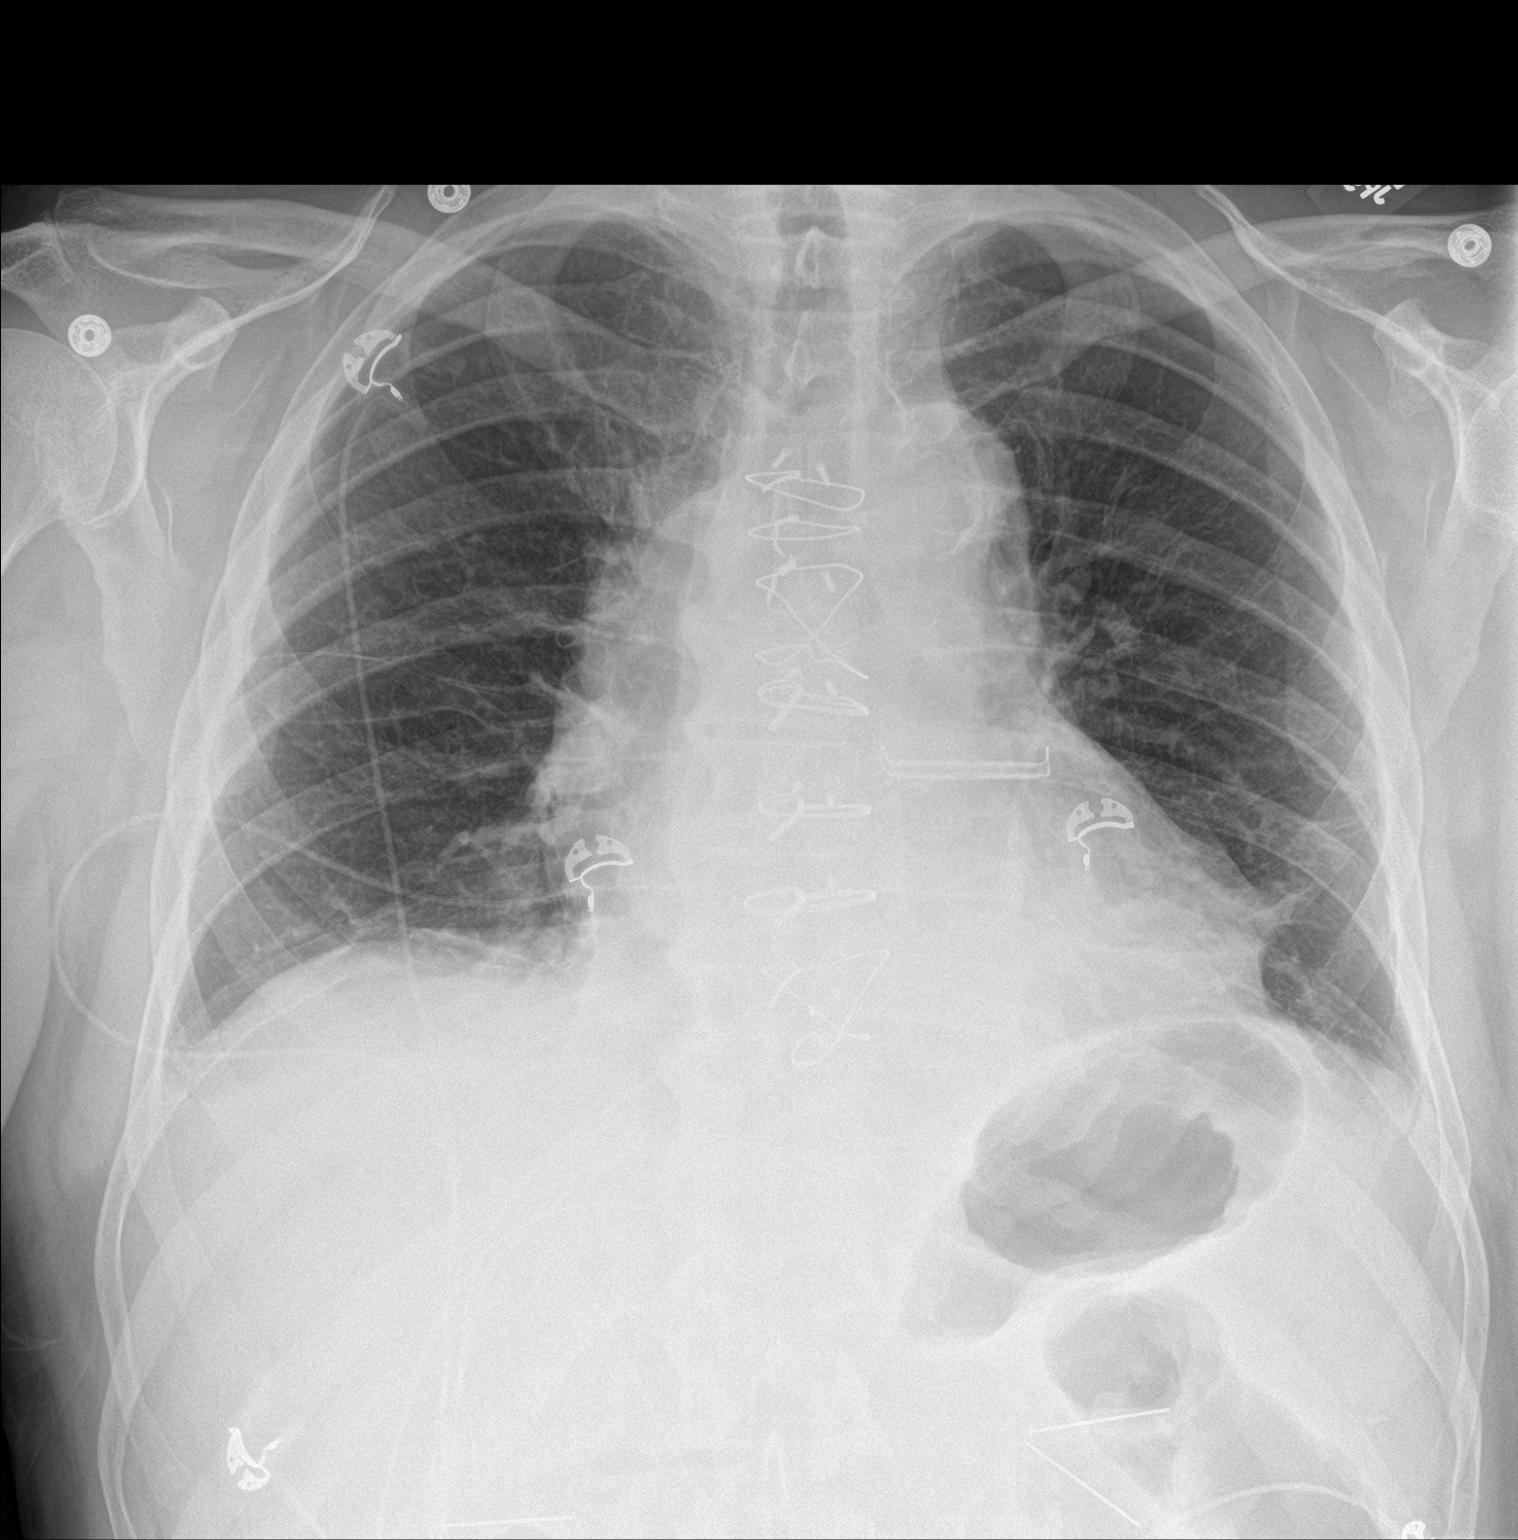

[chest lat]
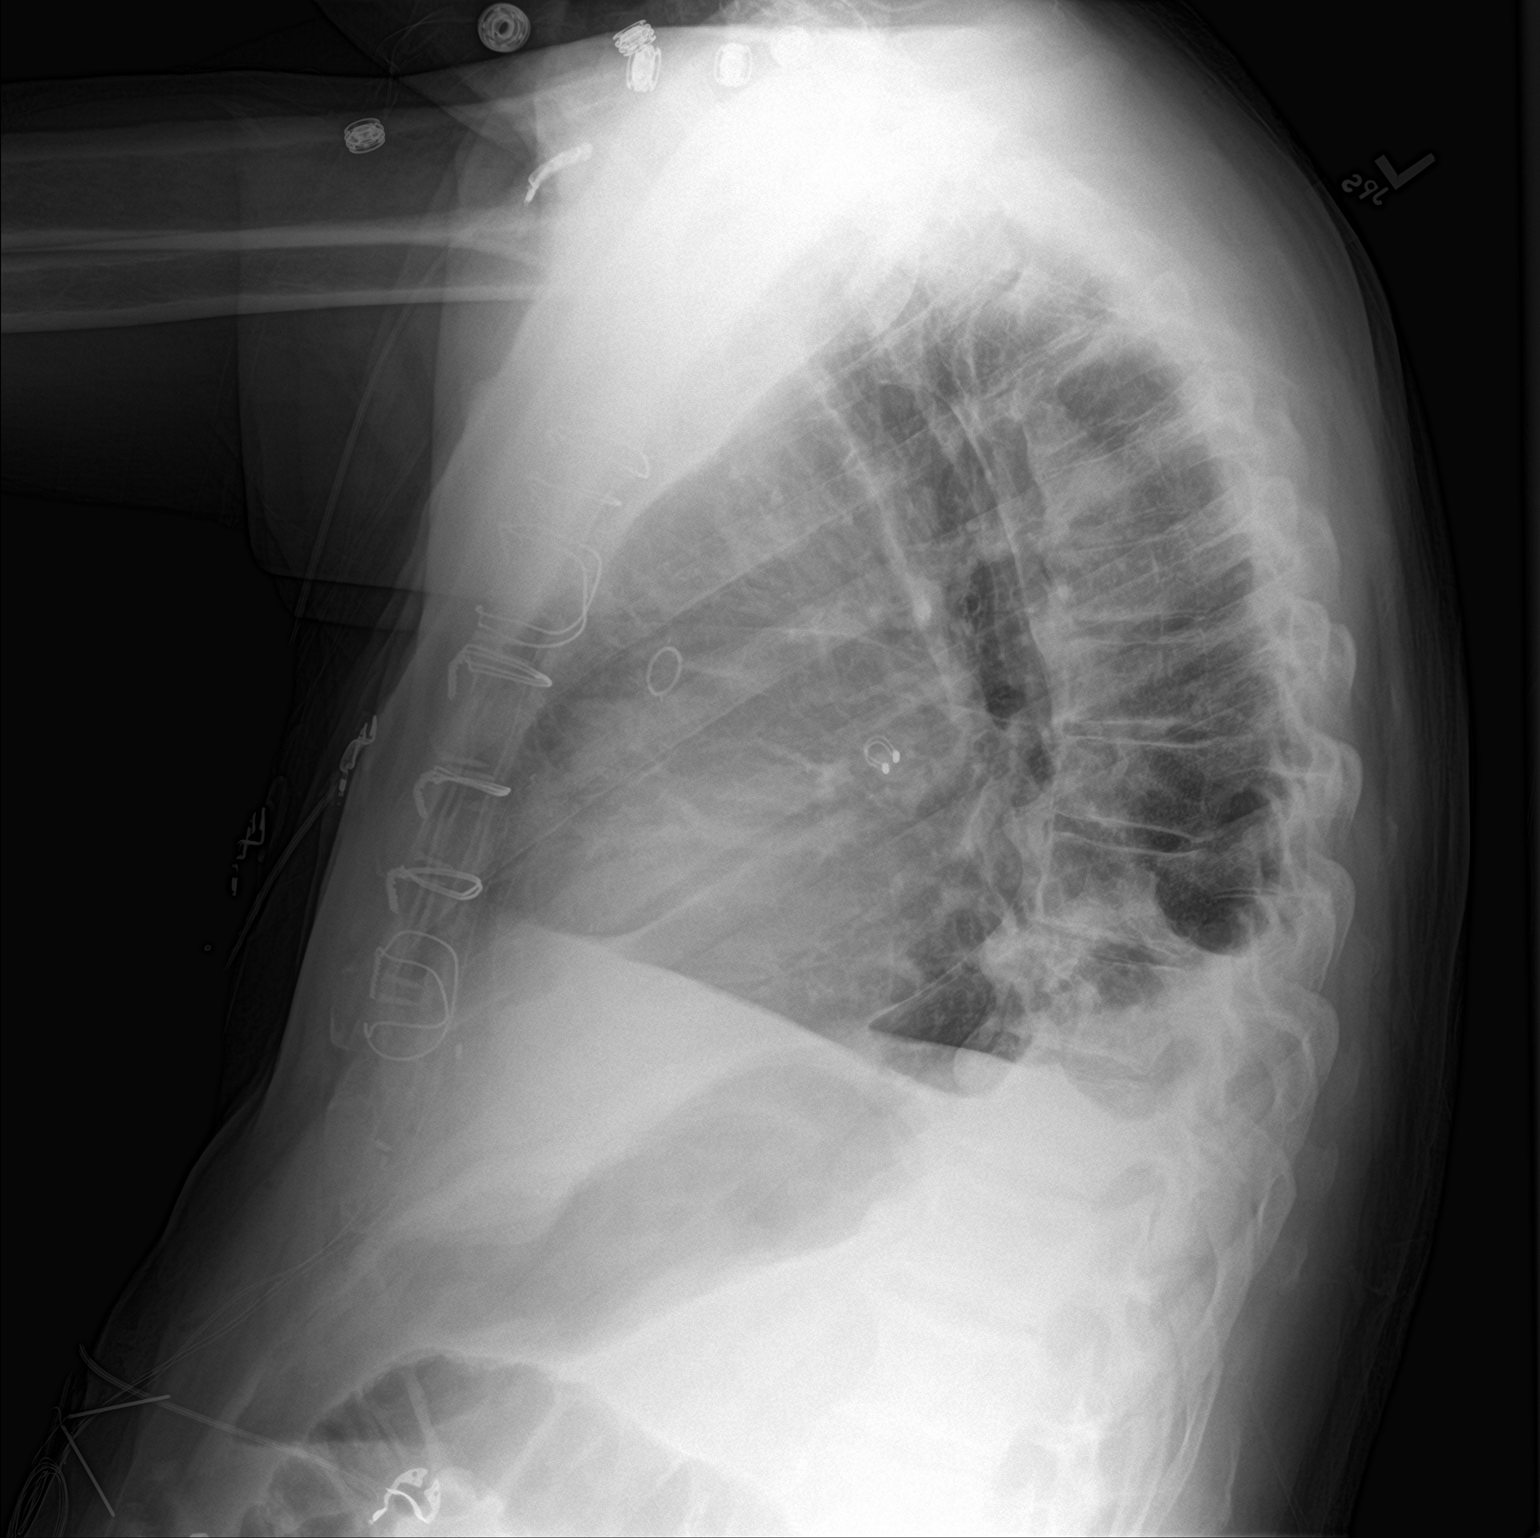

[2 of 2 positions shown; findings below may reference images not displayed]

FINDINGS: Median sternotomy wires unchanged. Lungs are somewhat hypoinflated
with subtle bibasilar density likely atelectasis. No significant
effusion. No evidence of right apical pneumothorax. Mild stable
cardiomegaly. Remainder of the exam is unchanged.
IMPRESSION: Subtle bibasilar atelectasis. No right apical pneumothorax
visualized.

Mild stable cardiomegaly.

## 2021-01-30 ENCOUNTER — Other Ambulatory Visit: Payer: Self-pay

## 2021-01-30 MED ORDER — ATORVASTATIN CALCIUM 80 MG PO TABS: ORAL_TABLET | ORAL | 3 refills | Status: DC

## 2021-02-01 IMAGING — DX DG CHEST 2V
2 series · 2 of 2 positions shown · non-contrast
Comparison: 12/03/2018

CLINICAL DATA: CABG follow-up

EXAM:
CHEST - 2 VIEW

[dg chest 2 view (1 of 2)]
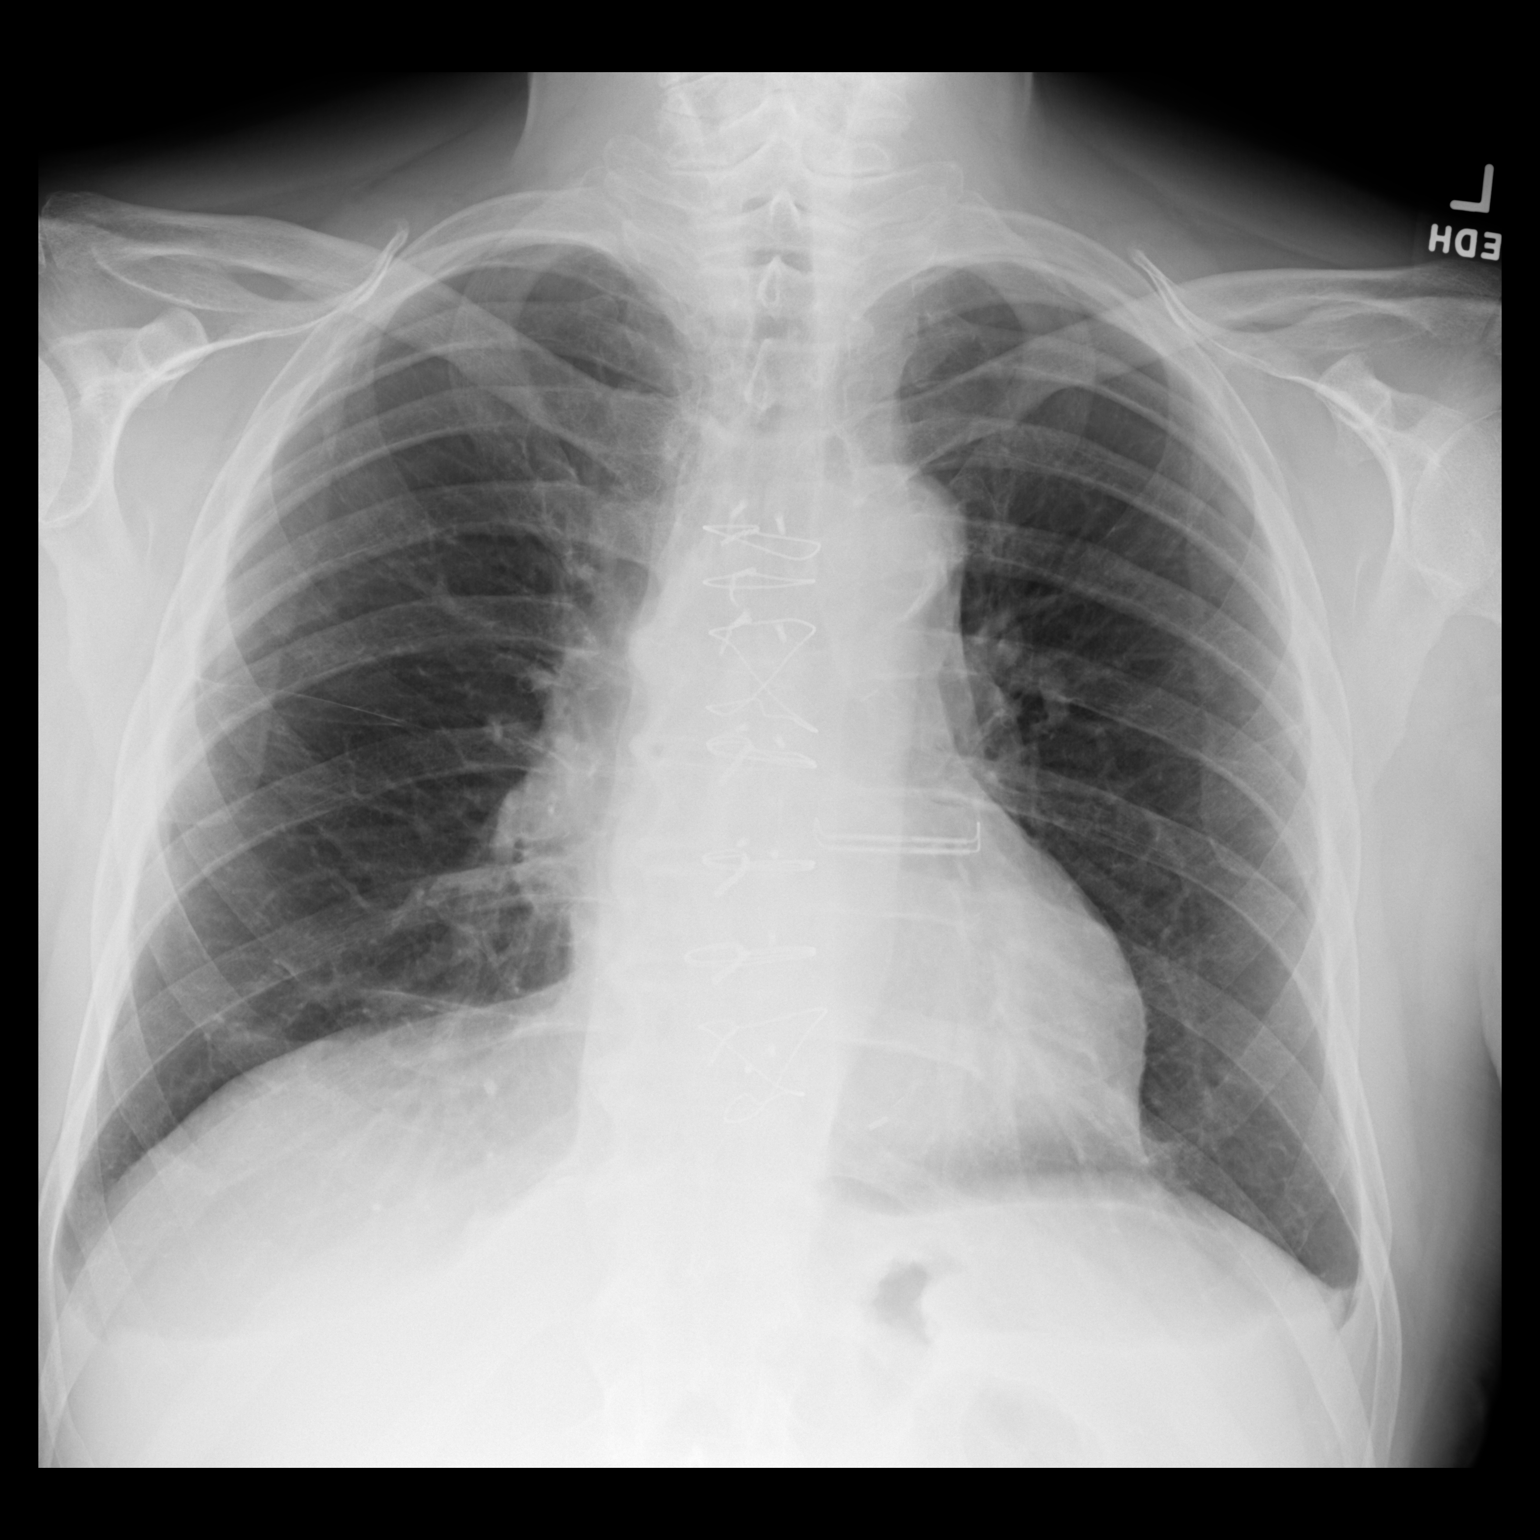

[dg chest 2 view (2 of 2)]
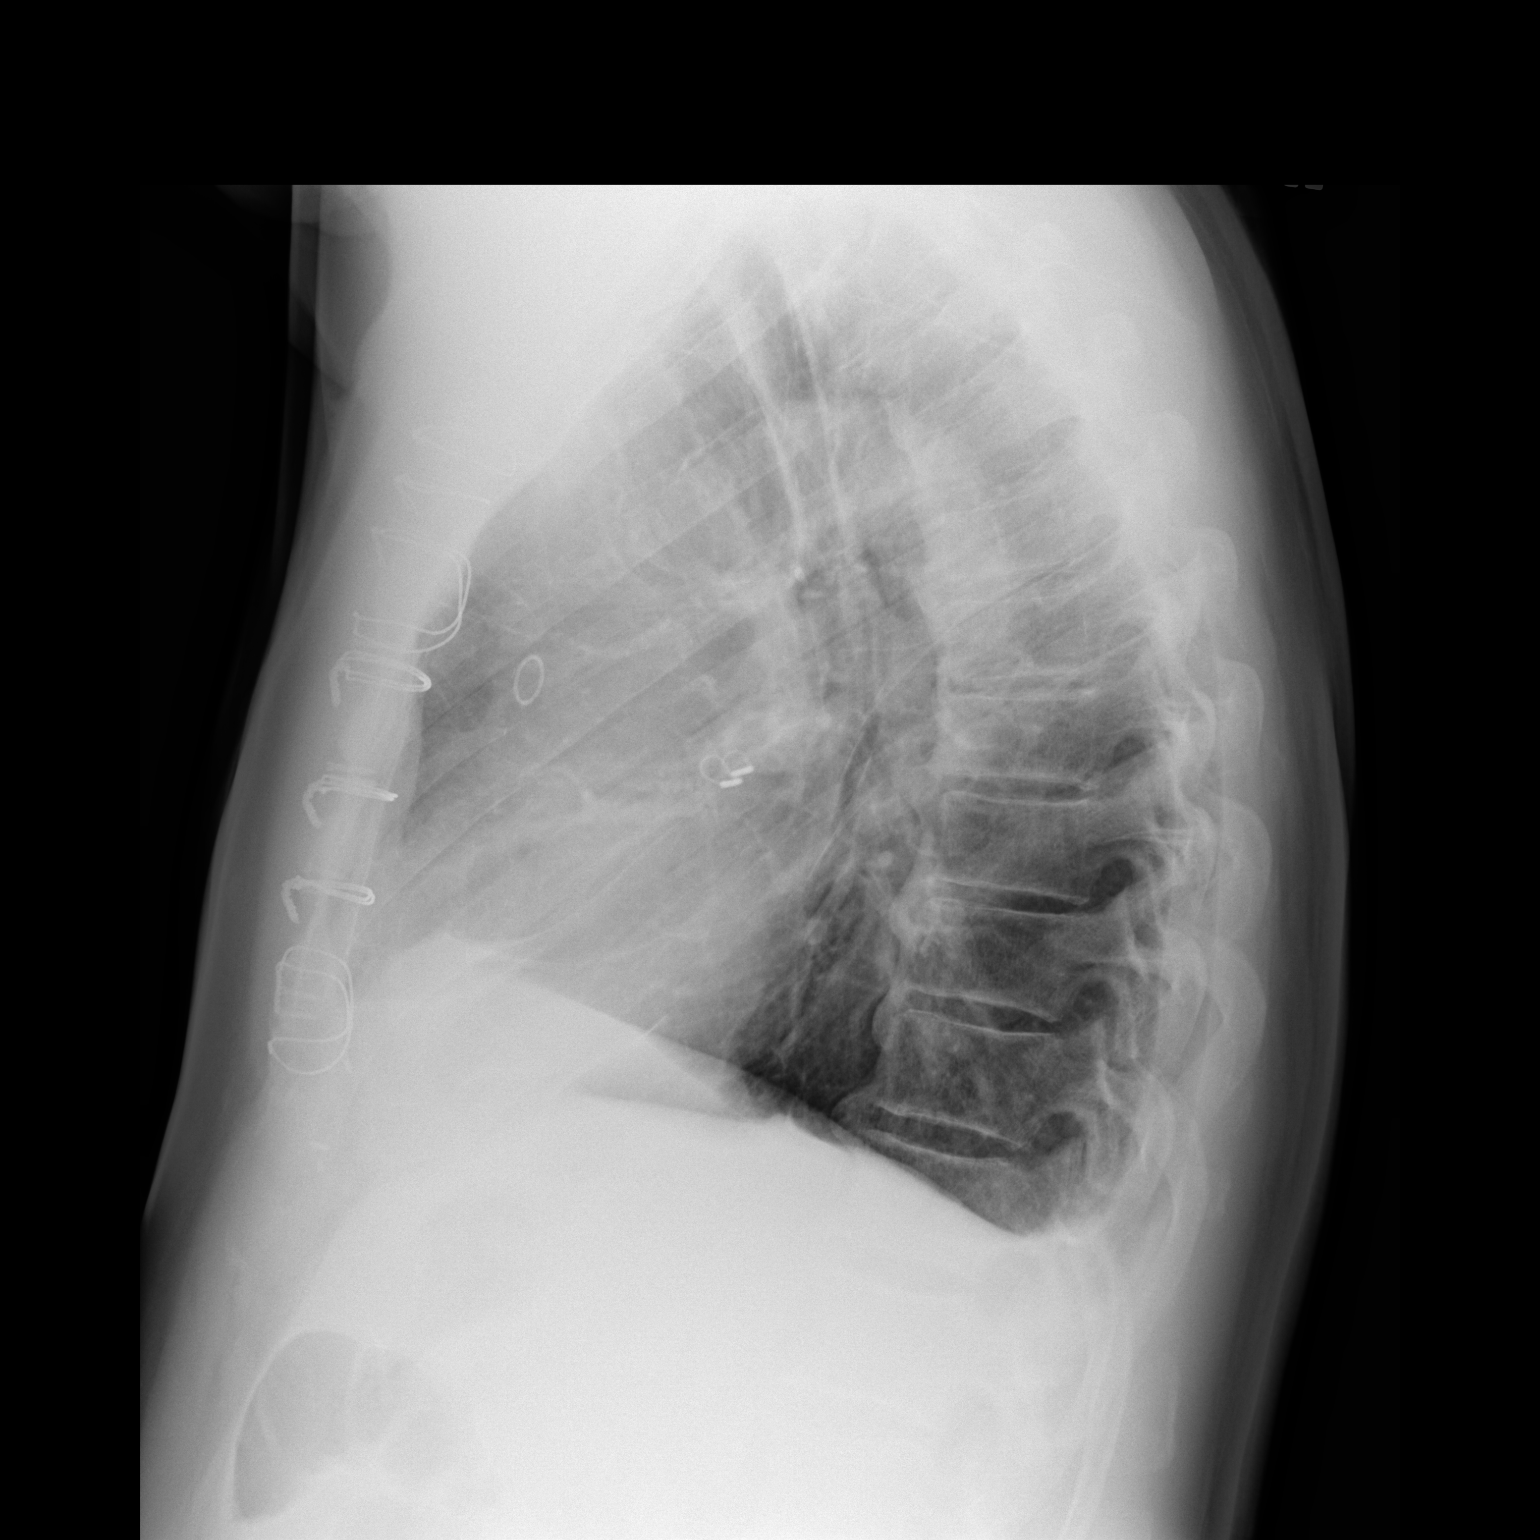

[2 of 2 positions shown; findings below may reference images not displayed]

FINDINGS: Normal heart size. Median sternotomy with CABG and left atrial
clipping. Resolved atelectasis. Trace pleural effusions remain.
IMPRESSION: Near complete resolution of postoperative changes. Only trace
pleural fluid remains.

## 2021-02-13 ENCOUNTER — Ambulatory Visit: Payer: Medicare HMO | Admitting: Cardiology

## 2021-02-19 ENCOUNTER — Encounter: Payer: Self-pay | Admitting: Pharmacist Clinician (PhC)/ Clinical Pharmacy Specialist

## 2021-02-19 ENCOUNTER — Other Ambulatory Visit: Payer: Self-pay | Admitting: Pharmacist Clinician (PhC)/ Clinical Pharmacy Specialist

## 2021-02-19 MED ORDER — LISINOPRIL 20 MG PO TABS: 20.0000 mg | ORAL_TABLET | Freq: Every day | ORAL | 3 refills | Status: DC

## 2021-03-13 ENCOUNTER — Encounter: Payer: Self-pay | Admitting: Pulmonary Disease

## 2021-03-13 ENCOUNTER — Ambulatory Visit: Payer: Medicare HMO | Admitting: Pulmonary Disease

## 2021-03-13 ENCOUNTER — Other Ambulatory Visit: Payer: Self-pay

## 2021-03-13 VITALS — BP 138/68 | HR 72 | Temp 98.4°F | Ht 72.0 in | Wt 206.0 lb

## 2021-03-13 DIAGNOSIS — R0609 Other forms of dyspnea: Secondary | ICD-10-CM | POA: Diagnosis not present

## 2021-03-13 NOTE — Patient Instructions (Signed)
Nice to meet you  We will get PFT (pulmonary function tests) in the coming days at your convenience to help better evaluate your symptoms  Based on results we will decide on follow up  For now, follow up as needed

## 2021-03-13 NOTE — Progress Notes (Signed)
@Patient  ID: Curtis Weiss, male    DOB: 07/22/1945, 76 y.o.   MRN: [id number]  Chief Complaint  Patient presents with   Consult    Consult for SOB. Pt states he has had this for about 6 months now. No known origin as to why it started.     Referring provider: Ledora Bottcher, PA  HPI:   76 y.o. man whom we are seeing in consultation for evaluation of dyspnea exertion.  Most recent cardiology note reviewed.  77-month history of dyspnea on exertion.  No issues at rest.  Usually worse on inclines or stairs.  Intermittent.  1 day can be fine, next day a bit more dyspneic with these activities.  Notes he walks regularly 2 to 3 miles a day without issue.  Plays golf without issue other than occasional dyspnea when going up hills.  No time of day when things are better or worse.  No position to make things better or worse.  No environmental or seasonal factors he identified to make things better or worse.  No other relieving or exacerbating factors.  Recent work-up reviewed including left heart cath 12/2020 with patent CABG grafts.  TTE 12/2020 that shows diastolic dysfunction, left atrial enlargement but otherwise reassuring.  Chest x-ray 01/21/2021 reviewed interpreted as clear lungs bilaterally with exception of chronic small right pleural effusion when compared to 2020 is unchanged on my review interpretation of that film as well.  PMH: CAD, hypertension Surgical history: CABG Family history: Mother with hypertension, father with hypertension Social history: Former smoker, 20-pack-year, quit in the early 1970s, retired, lives in [place] / Pulmonary Flowsheets:   ACT:  No flowsheet data found.  MMRC: No flowsheet data found.  Epworth:  No flowsheet data found.  Tests:   FENO:  No results found for: NITRICOXIDE  PFT: PFT Results Latest Ref Rng & Units 11/29/2018  FVC-Pre L 4.49  FVC-Predicted Pre % 96  Pre FEV1/FVC % % 78  FEV1-Pre L 3.50   FEV1-Predicted Pre % 102  Spirometry 11/2018 reviewed Interpreted as normal spirometry  WALK:  No flowsheet data found.  Imaging: Personally reviewed and as per EMR discussion this note  Lab Results: Personally reviewed CBC    Component Value Date/Time   WBC 7.4 11/28/2020 1422   WBC 6.6 02/03/2019 1738   RBC 4.27 11/28/2020 1422   RBC 4.09 (L) 02/03/2019 1738   HGB 14.6 11/28/2020 1422   HCT 41.3 11/28/2020 1422   PLT 201 11/28/2020 1422   MCV 97 11/28/2020 1422   MCH 34.2 (H) 11/28/2020 1422   MCH 30.3 02/03/2019 1738   MCHC 35.4 11/28/2020 1422   MCHC 32.0 02/03/2019 1738   RDW 12.1 11/28/2020 1422   LYMPHSABS 1.3 02/03/2019 1738   MONOABS 0.7 02/03/2019 1738   EOSABS 0.5 02/03/2019 1738   BASOSABS 0.0 02/03/2019 1738    BMET    Component Value Date/Time   NA 140 11/28/2020 1422   K 5.1 11/28/2020 1422   CL 101 11/28/2020 1422   CO2 23 11/28/2020 1422   GLUCOSE 169 (H) 11/28/2020 1422   GLUCOSE 126 (H) 02/03/2019 1738   BUN 14 11/28/2020 1422   CREATININE 1.20 11/28/2020 1422   CALCIUM 9.9 11/28/2020 1422   GFRNONAA 78 04/22/2019 0940   GFRAA 90 04/22/2019 0940    BNP No results found for: BNP  ProBNP No results found for: PROBNP  Specialty Problems       Pulmonary Problems  Exertional dyspnea    No Known Allergies  Immunization History  Administered Date(s) Administered   Fluad Quad(high Dose 65+) 10/25/2018   H1N1 02/07/2008   Influenza, High Dose Seasonal PF 12/06/2012, 01/23/2015, 12/07/2015, 11/11/2016, 10/25/2018   PFIZER Comirnaty(Gray Top)Covid-19 Tri-Sucrose Vaccine 02/24/2019, 03/17/2019, 11/13/2019, 09/14/2020   Pneumococcal Conjugate-13 04/07/2016   Pneumococcal Polysaccharide-23 10/29/1999   Zoster, Live 05/31/2004    Past Medical History:  Diagnosis Date   AAA (abdominal aortic aneurysm) without rupture    Chronic insomnia    Herniated lumbar intervertebral disc    Hypertension    Idiopathic peripheral neuropathy     Pure hypercholesterolemia     Tobacco History: Social History   Tobacco Use  Smoking Status Former  Smokeless Tobacco Never   Counseling given: Not Answered   Continue to not smoke  Outpatient Encounter Medications as of 03/13/2021  Medication Sig   allopurinol (ZYLOPRIM) 100 MG tablet Take 100 mg by mouth 2 (two) times daily.   amLODipine (NORVASC) 10 MG tablet TAKE ONE TABLET BY MOUTH DAILY   aspirin EC 81 MG tablet Take 81 mg by mouth daily.   atorvastatin (LIPITOR) 80 MG tablet TAKE ONE TABLET BY MOUTH DAILY AT 6PM   colchicine 0.6 MG tablet Take 0.6 mg by mouth daily as needed (Gout).   eplerenone (INSPRA) 50 MG tablet Take 50 mg by mouth daily.   ezetimibe (ZETIA) 10 MG tablet Take 10 mg by mouth daily.   Ferrous Sulfate-C-Folic Acid 756-433-2.9 MG TBCR Take by mouth.   hydrALAZINE (APRESOLINE) 25 MG tablet TAKE ONE TABLET BY MOUTH TWICE A DAY   lisinopril (ZESTRIL) 20 MG tablet Take 1 tablet (20 mg total) by mouth daily.   Magnesium 500 MG CAPS Take 500 mg by mouth daily.   Multiple Vitamins-Minerals (MULTIVITAMIN PO) Take 1 tablet by mouth daily.   OVER THE COUNTER MEDICATION Take 1 tablet by mouth daily. nervive   hydrochlorothiazide (HYDRODIURIL) 25 MG tablet Take 1 tablet (25 mg total) by mouth daily.   Facility-Administered Encounter Medications as of 03/13/2021  Medication   sodium chloride flush (NS) 0.9 % injection 3 mL     Review of Systems  Review of Systems  No chest with exertion.  No orthopnea or PND.  No lower extremity swelling.  Comprehensive review of systems otherwise negative. Physical Exam  BP 138/68 (BP Location: Left Arm, Patient Position: Sitting, Cuff Size: Normal)    Pulse 72    Temp 98.4 F (36.9 C) (Oral)    Ht 6' (1.829 m)    Wt 206 lb (93.4 kg)    SpO2 98%    BMI 27.94 kg/m   Wt Readings from Last 5 Encounters:  03/13/21 206 lb (93.4 kg)  01/21/21 205 lb 3.2 oz (93.1 kg)  12/06/20 202 lb 9.6 oz (91.9 kg)  12/04/20 191 lb (86.6 kg)   11/28/20 196 lb (88.9 kg)    BMI Readings from Last 5 Encounters:  03/13/21 27.94 kg/m  01/21/21 27.83 kg/m  12/06/20 27.48 kg/m  12/04/20 25.90 kg/m  11/28/20 26.58 kg/m     Physical Exam General: Well-appearing, no acute distress Eyes: EOMI, no icterus Neck: Supple, no JVP Cardiovascular: Regular rhythm, no murmur Pulmonary: Clear, normal work of breathing Abdomen: Nondistended, bowel sounds present MSK: No synovitis, no joint effusion Neuro: Normal gait, no weakness Psych: Normal mood, full affect  Assessment & Plan:   Dyspnea on exertion: Present for about 6 months.  Intermittent.  Inconsistent.  Some activities are fine 1  day then more dyspneic the next.  Recent cardiac evaluation relatively reassuring.  Does have diastolic dysfunction left atrial enlargement so he could have a component of cardiac cause.  Chest x-ray clear and stable with stable small right pleural effusion following CABG 2020, no real parenchymal explanation for symptoms.  Will obtain PFTs for further evaluation.  High suspicion for asthma given the intermittent nature of symptoms.  Discussed this at length today.  Discussed role of inhaler therapy which she is a bit distant to which is reasonable given his relatively mild symptoms.  Based on PFTs, we will reconvene and discuss utility of inhaler therapy in the future.   Return if symptoms worsen or fail to improve.   Lanier Clam, MD 03/13/2021

## 2021-04-05 ENCOUNTER — Other Ambulatory Visit: Payer: Self-pay | Admitting: Pulmonary Disease

## 2021-04-05 DIAGNOSIS — R0609 Other forms of dyspnea: Secondary | ICD-10-CM

## 2021-04-08 ENCOUNTER — Ambulatory Visit (INDEPENDENT_AMBULATORY_CARE_PROVIDER_SITE_OTHER): Payer: Medicare HMO | Admitting: Pulmonary Disease

## 2021-04-08 ENCOUNTER — Other Ambulatory Visit: Payer: Self-pay

## 2021-04-08 DIAGNOSIS — R0609 Other forms of dyspnea: Secondary | ICD-10-CM

## 2021-04-08 NOTE — Progress Notes (Signed)
Full PFT completed today ? ?

## 2021-04-11 LAB — PULMONARY FUNCTION TEST
DL/VA % pred: 91 %
DL/VA: 3.56 ml/min/mmHg/L
DLCO cor % pred: 91 %
DLCO cor: 24.42 ml/min/mmHg
DLCO unc % pred: 91 %
DLCO unc: 24.42 ml/min/mmHg
FEF 25-75 Post: 2.51 L/sec
FEF 25-75 Pre: 2.21 L/sec
FEF2575-%Change-Post: 13 %
FEF2575-%Pred-Post: 106 %
FEF2575-%Pred-Pre: 93 %
FEV1-%Change-Post: 3 %
FEV1-%Pred-Post: 103 %
FEV1-%Pred-Pre: 99 %
FEV1-Post: 3.39 L
FEV1-Pre: 3.26 L
FEV1FVC-%Change-Post: 3 %
FEV1FVC-%Pred-Pre: 98 %
FEV6-%Change-Post: 0 %
FEV6-%Pred-Post: 105 %
FEV6-%Pred-Pre: 106 %
FEV6-Post: 4.52 L
FEV6-Pre: 4.54 L
FEV6FVC-%Change-Post: 0 %
FEV6FVC-%Pred-Post: 104 %
FEV6FVC-%Pred-Pre: 105 %
FVC-%Change-Post: 0 %
FVC-%Pred-Post: 101 %
FVC-%Pred-Pre: 100 %
FVC-Post: 4.59 L
FVC-Pre: 4.57 L
Post FEV1/FVC ratio: 74 %
Post FEV6/FVC ratio: 98 %
Pre FEV1/FVC ratio: 71 %
Pre FEV6/FVC Ratio: 99 %
RV % pred: 101 %
RV: 2.72 L
TLC % pred: 98 %
TLC: 7.33 L

## 2021-04-11 NOTE — Progress Notes (Signed)
PFTs are normal, follow up as needed. Please let him know.

## 2021-04-28 ENCOUNTER — Emergency Department (HOSPITAL_COMMUNITY)
Admission: EM | Admit: 2021-04-28 | Discharge: 2021-04-29 | Disposition: A | Discharge status: Home / Self Care | Payer: Medicare HMO | Attending: Emergency Medicine | Admitting: Emergency Medicine

## 2021-04-28 ENCOUNTER — Emergency Department (HOSPITAL_COMMUNITY): Payer: Medicare HMO

## 2021-04-28 DIAGNOSIS — I10 Essential (primary) hypertension: Secondary | ICD-10-CM | POA: Insufficient documentation

## 2021-04-28 DIAGNOSIS — F10920 Alcohol use, unspecified with intoxication, uncomplicated: Secondary | ICD-10-CM

## 2021-04-28 DIAGNOSIS — F10129 Alcohol abuse with intoxication, unspecified: Secondary | ICD-10-CM | POA: Insufficient documentation

## 2021-04-28 DIAGNOSIS — Y908 Blood alcohol level of 240 mg/100 ml or more: Secondary | ICD-10-CM | POA: Diagnosis not present

## 2021-04-28 DIAGNOSIS — Z7982 Long term (current) use of aspirin: Secondary | ICD-10-CM | POA: Diagnosis not present

## 2021-04-28 DIAGNOSIS — I25119 Atherosclerotic heart disease of native coronary artery with unspecified angina pectoris: Secondary | ICD-10-CM | POA: Insufficient documentation

## 2021-04-28 DIAGNOSIS — R4182 Altered mental status, unspecified: Secondary | ICD-10-CM | POA: Diagnosis present

## 2021-04-28 DIAGNOSIS — Z79899 Other long term (current) drug therapy: Secondary | ICD-10-CM | POA: Diagnosis not present

## 2021-04-28 DIAGNOSIS — Z951 Presence of aortocoronary bypass graft: Secondary | ICD-10-CM | POA: Insufficient documentation

## 2021-04-28 LAB — CBC WITH DIFFERENTIAL/PLATELET
Abs Immature Granulocytes: 0.04 10*3/uL (ref 0.00–0.07)
Basophils Absolute: 0.1 10*3/uL (ref 0.0–0.1)
Basophils Relative: 1 %
Eosinophils Absolute: 0.3 10*3/uL (ref 0.0–0.5)
Eosinophils Relative: 4 %
HCT: 38.5 % — ABNORMAL LOW (ref 39.0–52.0)
Hemoglobin: 13.7 g/dL (ref 13.0–17.0)
Immature Granulocytes: 1 %
Lymphocytes Relative: 16 %
Lymphs Abs: 1 10*3/uL (ref 0.7–4.0)
MCH: 35.2 pg — ABNORMAL HIGH (ref 26.0–34.0)
MCHC: 35.6 g/dL (ref 30.0–36.0)
MCV: 99 fL (ref 80.0–100.0)
Monocytes Absolute: 0.5 10*3/uL (ref 0.1–1.0)
Monocytes Relative: 8 %
Neutro Abs: 4.6 10*3/uL (ref 1.7–7.7)
Neutrophils Relative %: 70 %
Platelets: 141 10*3/uL — ABNORMAL LOW (ref 150–400)
RBC: 3.89 MIL/uL — ABNORMAL LOW (ref 4.22–5.81)
RDW: 13.3 % (ref 11.5–15.5)
WBC: 6.5 10*3/uL (ref 4.0–10.5)
nRBC: 0 % (ref 0.0–0.2)

## 2021-04-28 LAB — BASIC METABOLIC PANEL
Anion gap: 11 (ref 5–15)
BUN: 10 mg/dL (ref 8–23)
CO2: 23 mmol/L (ref 22–32)
Calcium: 9 mg/dL (ref 8.9–10.3)
Chloride: 104 mmol/L (ref 98–111)
Creatinine, Ser: 0.85 mg/dL (ref 0.61–1.24)
GFR, Estimated: >60 mL/min (ref >60)
Glucose, Bld: 115 mg/dL — ABNORMAL HIGH (ref 70–99)
Potassium: 3.4 mmol/L — ABNORMAL LOW (ref 3.5–5.1)
Sodium: 138 mmol/L (ref 135–145)

## 2021-04-28 LAB — PROTIME-INR
INR: 1 (ref 0.8–1.2)
Prothrombin Time: 13 seconds (ref 11.4–15.2)

## 2021-04-28 LAB — ETHANOL: Alcohol, Ethyl (B): 258 mg/dL — ABNORMAL HIGH (ref <10)

## 2021-04-28 NOTE — ED Triage Notes (Signed)
Pt BIB EMS for a fall on coumadin, unknown if he hit his head and no clear abrasions or bruising to the head. Disoriented to place and time, slurring. ETOH on board. Last talked to at 68 and family found at 2130 according to EMS ? ?152/78 ?60 HR  ?96% ? ?

## 2021-04-28 NOTE — ED Notes (Signed)
EDP notified of pt being a fall on coumadin with an unknown head trauma with disorientation.  ?

## 2021-04-28 NOTE — ED Provider Notes (Signed)
?[facility] ?Provider Note ? ?CSN: [id number] ?Arrival date & time: 04/28/21 2244 ? ?Chief Complaint(s) ?Fall ? ?HPI ?Curtis Weiss is a 76 y.o. male   ? ?The history is provided by the patient and the spouse.  ?Altered Mental Status ?Presenting symptoms: disorientation   ?Severity:  Moderate ?Most recent episode:  Today ?Duration:  2 hours ?Timing:  Constant ?Progression:  Improving ?Context: alcohol use  Dementia: wife states that he drank 2 drinks with vodka tonight. Noted that he was more drunk than he should be for the amount that he drank.Marland Kitchen ?Associated symptoms: slurred speech and weakness (generaly weak and had difficulty walking. "Crawled" to the bedroom.)   ?Associated symptoms: no fever, no hallucinations and no seizures   ? ?Wife reports that she helped lay him in bed. A while later, he called out to her for assistance walking to the restroom. While trying to walk, his legs gave out and he collapsed to the floor. No significant trauma or injury. No head trauma. No LOC. They deny AC.  ? ?He has a history of chronic back pain for which he takes Aleve, and admits to smoking medical marijuana, but states he did not smoke tonight. No bladder/bowel incontinence. No lower extremity weakness. ? ? ? ?Past Medical History ?Past Medical History:  ?Diagnosis Date  ? AAA (abdominal aortic aneurysm) without rupture   ? Chronic insomnia   ? Herniated lumbar intervertebral disc   ? Hypertension   ? Idiopathic peripheral neuropathy   ? Pure hypercholesterolemia   ? ?Patient Active Problem List  ? Diagnosis Date Noted  ? Exertional dyspnea 12/04/2020  ? 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 Coronary artery disease 11/30/2018  ? Unstable angina ([place]) 11/26/2018  ? Bradycardia 11/26/2018  ? Hypokalemia 11/26/2018  ? Coronary artery disease involving native coronary artery with angina pectoris ([place]) 11/26/2018  ? Hypertension   ? AAA (abdominal aortic aneurysm) without rupture   ? Chest pain  11/25/2018  ? ?Home Medication(s) ?Prior to Admission medications   ?Medication Sig Start Date End Date Taking? Authorizing Provider  ?allopurinol (ZYLOPRIM) 100 MG tablet Take 100 mg by mouth 2 (two) times daily.    [provider]  ?amLODipine (NORVASC) 10 MG tablet TAKE ONE TABLET BY MOUTH DAILY 08/21/20   Lelon Perla, MD  ?aspirin EC 81 MG tablet Take 81 mg by mouth daily.    [provider]  ?atorvastatin (LIPITOR) 80 MG tablet TAKE ONE TABLET BY MOUTH DAILY AT 6PM 01/30/21   Lelon Perla, MD  ?colchicine 0.6 MG tablet Take 0.6 mg by mouth daily as needed (Gout). 05/30/19   [provider]  ?eplerenone (INSPRA) 50 MG tablet Take 50 mg by mouth daily.    [provider]  ?ezetimibe (ZETIA) 10 MG tablet Take 10 mg by mouth daily.    [provider]  ?Ferrous Sulfate-C-Folic Acid 468-032-1.2 MG TBCR Take by mouth.    [provider]  ?hydrALAZINE (APRESOLINE) 25 MG tablet TAKE ONE TABLET BY MOUTH TWICE A DAY 09/20/20   Lelon Perla, MD  ?hydrochlorothiazide (HYDRODIURIL) 25 MG tablet Take 1 tablet (25 mg total) by mouth daily. 11/09/20 02/07/21  Lelon Perla, MD  ?lisinopril (ZESTRIL) 20 MG tablet Take 1 tablet (20 mg total) by mouth daily. 02/19/21   Lelon Perla, MD  ?Magnesium 500 MG CAPS Take 500 mg by mouth daily.    [provider]  ?Multiple Vitamins-Minerals (MULTIVITAMIN PO) Take 1  tablet by mouth daily.    [provider]  ?OVER THE COUNTER MEDICATION Take 1 tablet by mouth daily. nervive    [provider]  ?                                                                                                                                  ?Allergies ?Patient has no known allergies. ? ?Review of Systems ?Review of Systems  ?Constitutional:  Negative for fever.  ?Neurological:  Positive for weakness (generaly weak and had difficulty walking. "Crawled" to the bedroom.). Negative for seizures.   ?Psychiatric/Behavioral:  Negative for hallucinations.   ?As noted in HPI ? ?Physical Exam ?Vital Signs  ?I have reviewed the triage vital signs ?BP (!) 148/79   Pulse 66   Temp 97.6 ?F (36.4 ?C)   Resp 18   SpO2 97%  ? ?Physical Exam ?Vitals reviewed.  ?Constitutional:   ?   General: He is not in acute distress. ?   Appearance: He is well-developed. He is not diaphoretic.  ?HENT:  ?   Head: Normocephalic and atraumatic.  ?   Nose: Nose normal.  ?Eyes:  ?   General: No scleral icterus.    ?   Right eye: No discharge.     ?   Left eye: No discharge.  ?   Conjunctiva/sclera: Conjunctivae normal.  ?   Pupils: Pupils are equal, round, and reactive to light.  ?Cardiovascular:  ?   Rate and Rhythm: Normal rate and regular rhythm.  ?   Heart sounds: No murmur heard. ?  No friction rub. No gallop.  ?Pulmonary:  ?   Effort: Pulmonary effort is normal. No respiratory distress.  ?   Breath sounds: Normal breath sounds. No stridor. No rales.  ?Abdominal:  ?   General: There is no distension.  ?   Palpations: Abdomen is soft.  ?   Tenderness: There is no abdominal tenderness.  ?Musculoskeletal:     ?   General: No tenderness.  ?   Cervical back: Normal range of motion and neck supple.  ?Skin: ?   General: Skin is warm and dry.  ?   Findings: No erythema or rash.  ?Neurological:  ?   Mental Status: He is alert and oriented to person, place, and time.  ?   Comments: Mental Status:  ?Alert and oriented to person, place, and time. ?Attention and concentration intact, but appears to be intoxicated. ?Speech slightly slurred.  ?Recent memory is intact ? ?Cranial Nerves:  ?II Visual Fields: Intact to confrontation. Visual fields intact. ?III, IV, VI: Pupils equal and reactive to light and near. Full eye movement with lateral nystagmus  ?V Facial Sensation: Normal. No weakness of masticatory muscles  ?VII: No facial weakness or asymmetry  ?VIII Auditory Acuity: Grossly normal  ?IX/X: The uvula is midline; the palate elevates  symmetrically  ?XI: Normal sternocleidomastoid and trapezius strength  ?XII: The  tongue is midline. No atrophy or fasciculations.  ? ?Motor System: Muscle Strength: 5/5 and symmetric in the upper and lower extremities. No pronation or drift.  ?Muscle Tone: Tone and muscle bulk are normal in the upper and lower extremities.  ?Reflexes: DTRs: 1+ and symmetrical in all four extremities. No Clonus ?Coordination: Intact finger-to-nose, heel-to-shin. No tremor.  ?Sensation: Intact to light touch, and pinprick. Negative Romberg test.  ?Gait: ataxic gait, but able to ambulate with one-arm assist. ?  ? ? ?ED Results and Treatments ?Labs ?(all labs ordered are listed, but only abnormal results are displayed) ?Labs Reviewed  ?CBC WITH DIFFERENTIAL/PLATELET - Abnormal; Notable for the following components:  ?    Result Value  ? RBC 3.89 (*)   ? HCT 38.5 (*)   ? MCH 35.2 (*)   ? Platelets 141 (*)   ? All other components within normal limits  ?BASIC METABOLIC PANEL - Abnormal; Notable for the following components:  ? Potassium 3.4 (*)   ? Glucose, Bld 115 (*)   ? All other components within normal limits  ?ETHANOL - Abnormal; Notable for the following components:  ? Alcohol, Ethyl (B) 258 (*)   ? All other components within normal limits  ?URINALYSIS, ROUTINE W REFLEX MICROSCOPIC - Abnormal; Notable for the following components:  ? Color, Urine STRAW (*)   ? All other components within normal limits  ?RAPID URINE DRUG SCREEN, HOSP PERFORMED - Abnormal; Notable for the following components:  ? Tetrahydrocannabinol POSITIVE (*)   ? All other components within normal limits  ?PROTIME-INR  ?                                                                                                                       ?EKG ? EKG Interpretation ? ?Date/Time:    ?Ventricular Rate:    ?PR Interval:    ?QRS Duration:   ?QT Interval:    ?QTC Calculation:   ?R Axis:     ?Text Interpretation:   ?  ? ?  ? ?Radiology ?DG Chest 1 View ? ?Result Date:  04/28/2021 ?CLINICAL DATA:  Status post fall. EXAM: CHEST  1 VIEW COMPARISON:  January 21, 2021 FINDINGS: Multiple sternal wires and vascular clips are noted. The heart size and mediastinal contours are within normal limits. A radiopaque

## 2021-04-29 LAB — RAPID URINE DRUG SCREEN, HOSP PERFORMED
Amphetamines: NOT DETECTED
Barbiturates: NOT DETECTED
Benzodiazepines: NOT DETECTED
Cocaine: NOT DETECTED
Opiates: NOT DETECTED
Tetrahydrocannabinol: POSITIVE — AB

## 2021-04-29 LAB — URINALYSIS, ROUTINE W REFLEX MICROSCOPIC
Bilirubin Urine: NEGATIVE
Glucose, UA: NEGATIVE mg/dL
Hgb urine dipstick: NEGATIVE
Ketones, ur: NEGATIVE mg/dL
Leukocytes,Ua: NEGATIVE
Nitrite: NEGATIVE
Protein, ur: NEGATIVE mg/dL
Specific Gravity, Urine: 1.005 (ref 1.005–1.030)
pH: 6 (ref 5.0–8.0)

## 2021-04-29 MED ORDER — SODIUM CHLORIDE 0.9 % IV SOLN: 1000.0000 mL | INTRAVENOUS | Status: DC

## 2021-04-29 MED ORDER — SODIUM CHLORIDE 0.9 % IV BOLUS (SEPSIS)
1000.0000 mL | Freq: Once | INTRAVENOUS | Status: AC
  Administered 2021-04-29: 1000 mL via INTRAVENOUS

## 2021-04-29 NOTE — ED Notes (Signed)
Ambulated pt in hallway, pt had steady gait and no complaints. Pt stated he did not remember why he was here ?

## 2021-04-29 NOTE — ED Notes (Signed)
Pt resting comfortably in bed, no acute distress noted.

## 2021-04-29 NOTE — ED Notes (Signed)
Pt ambulated w/ assistance to the bathroom with an unsteady gait. ?

## 2021-07-02 NOTE — Progress Notes (Signed)
HPI:FU CAD and hypertension. Based on outside records he has had renal Dopplers previously that showed no renal artery stenosis and normal catecholamine and cortisol levels. Carotid Dopplers October 2020 showed 1 to 39% bilateral stenosis.  Patient underwent coronary artery bypass and graft October 2020 with LIMA to the LAD, RIMA to the PDA and saphenous vein graft to the diagonal.  Abdominal ultrasound September 2022 showed 3.5 cm abdominal aortic aneurysm, severe atherosclerosis.  Cardiac catheterization November 2022 showed 30% left main, 60% followed by 65% LAD, 90% mid LAD with 95% first diagonal, 35% circumflex, 70% right coronary artery followed by 90% lesion and 99% mid RCA.  The RIMA to the right coronary artery, LIMA to the LAD and saphenous vein graft to D1 are all patent.  Ejection fraction 55 to 65%; medical therapy recommended.  Echocardiogram November 2022 showed ejection fraction 60 to 65%, mild to moderate left atrial enlargement.  Monitor December 2022 showed sinus rhythm with occasional PAC, PVC, rare ventricular couplet and Mobitz 1 second-degree AV block in the early a.m. hours.  Since last seen the patient denies any dyspnea on exertion, orthopnea, PND, pedal edema, palpitations, syncope or chest pain.   Current Outpatient Medications  Medication Sig Dispense Refill   allopurinol (ZYLOPRIM) 100 MG tablet Take 100 mg by mouth 2 (two) times daily.     amLODipine (NORVASC) 10 MG tablet TAKE ONE TABLET BY MOUTH DAILY 90 tablet 3   aspirin EC 81 MG tablet Take 81 mg by mouth daily.     atorvastatin (LIPITOR) 80 MG tablet TAKE ONE TABLET BY MOUTH DAILY AT 6PM 90 tablet 3   colchicine 0.6 MG tablet Take 0.6 mg by mouth daily as needed (Gout).     eplerenone (INSPRA) 50 MG tablet Take 50 mg by mouth daily.     ezetimibe (ZETIA) 10 MG tablet Take 10 mg by mouth daily.     Ferrous Sulfate-C-Folic Acid 409-811-9.1 MG TBCR Take by mouth.     hydrALAZINE (APRESOLINE) 25 MG tablet TAKE  ONE TABLET BY MOUTH TWICE A DAY 180 tablet 3   hydrochlorothiazide (HYDRODIURIL) 25 MG tablet Take 1 tablet (25 mg total) by mouth daily. 90 tablet 3   lisinopril (ZESTRIL) 20 MG tablet Take 1 tablet (20 mg total) by mouth daily. 90 tablet 3   Magnesium 500 MG CAPS Take 500 mg by mouth daily.     Multiple Vitamins-Minerals (MULTIVITAMIN PO) Take 1 tablet by mouth daily.     OVER THE COUNTER MEDICATION Take 1 tablet by mouth daily. nervive     Current Facility-Administered Medications  Medication Dose Route Frequency Provider Last Rate Last Admin   sodium chloride flush (NS) 0.9 % injection 3 mL  3 mL Intravenous Q12H Warren Lacy, PA-C         Past Medical History:  Diagnosis Date   AAA (abdominal aortic aneurysm) without rupture    Chronic insomnia    Herniated lumbar intervertebral disc    Hypertension    Idiopathic peripheral neuropathy    Pure hypercholesterolemia     Past Surgical History:  Procedure Laterality Date   Arm surgery     CLIPPING OF ATRIAL APPENDAGE N/A 11/30/2018   Procedure: Clipping Of Atrial Appendage - using AtriCure clip size 26m;  Surgeon: AWonda Olds MD;  Location: [facility]  Service: Open Heart Surgery;  Laterality: N/A;   CORONARY ARTERY BYPASS GRAFT N/A 11/30/2018   Procedure: CORONARY ARTERY BYPASS GRAFTING (CABG) x three,  using bilateral mammary arteries and right leg greater saphenous vein harvested endoscopically;  Surgeon: Wonda Olds, MD;  Location: [facility];  Service: Open Heart Surgery;  Laterality: N/A;   LEFT HEART CATH AND CORONARY ANGIOGRAPHY N/A 11/26/2018   Procedure: LEFT HEART CATH AND CORONARY ANGIOGRAPHY;  Surgeon: Leonie Man, MD;  Location: [facility];  Service: Cardiovascular;  Laterality: N/A;   LEFT HEART CATH AND CORS/GRAFTS ANGIOGRAPHY N/A 12/04/2020   Procedure: LEFT HEART CATH AND CORS/GRAFTS ANGIOGRAPHY;  Surgeon: Leonie Man, MD;  Location: [facility];  Service: Cardiovascular;   Laterality: N/A;   SHOULDER SURGERY     TEE WITHOUT CARDIOVERSION N/A 11/30/2018   Procedure: TRANSESOPHAGEAL ECHOCARDIOGRAM (TEE);  Surgeon: Wonda Olds, MD;  Location: [facility];  Service: Open Heart Surgery;  Laterality: N/A;    Social History   Socioeconomic History   Marital status: Married    Spouse name: Not on file   Number of children: 3   Years of education: Not on file   Highest education level: Not on file  Occupational History    Comment: Retired  Tobacco Use   Smoking status: Former   Smokeless tobacco: Never  Substance and Sexual Activity   Alcohol use: Yes    Comment: 14   Drug use: Not on file   Sexual activity: Not on file  Other Topics Concern   Not on file  Social History Narrative   Not on file   Social Determinants of Health   Financial Resource Strain: Not on file  Food Insecurity: Not on file  Transportation Needs: Not on file  Physical Activity: Not on file  Stress: Not on file  Social Connections: Not on file  Intimate Partner Violence: Not on file    Family History  Problem Relation Age of Onset   Hypertension Mother    Hypertension Father     ROS: no fevers or chills, productive cough, hemoptysis, dysphasia, odynophagia, melena, hematochezia, dysuria, hematuria, rash, seizure activity, orthopnea, PND, pedal edema, claudication. Remaining systems are negative.  Physical Exam: Well-developed well-nourished in no acute distress.  Skin is warm and dry.  HEENT is normal.  Neck is supple.  Chest is clear to auscultation with normal expansion.  Cardiovascular exam is regular rate and rhythm.  Abdominal exam nontender or distended. No masses palpated. Extremities show no edema. neuro grossly intact  A/P  1 coronary artery disease status post coronary artery bypass and graft-no chest pain.  Recent catheterization is noted.  Plan medical therapy.  Continue aspirin and statin.  2 abdominal ultrasound-plan follow-up ultrasound September  2023.   3 hypertension-blood pressure controlled.  Continue present medications and follow-up.  4 hyperlipidemia-continue statin.  Kirk Ruths, MD

## 2021-07-10 ENCOUNTER — Encounter: Payer: Self-pay | Admitting: Cardiology

## 2021-07-10 ENCOUNTER — Ambulatory Visit: Payer: Medicare HMO | Admitting: Cardiology

## 2021-07-10 VITALS — BP 120/56 | HR 68 | Ht 71.0 in | Wt 200.0 lb

## 2021-07-10 DIAGNOSIS — I25110 Atherosclerotic heart disease of native coronary artery with unstable angina pectoris: Secondary | ICD-10-CM

## 2021-07-10 DIAGNOSIS — E7800 Pure hypercholesterolemia, unspecified: Secondary | ICD-10-CM

## 2021-07-10 DIAGNOSIS — I10 Essential (primary) hypertension: Secondary | ICD-10-CM | POA: Diagnosis not present

## 2021-07-10 DIAGNOSIS — I714 Abdominal aortic aneurysm, without rupture, unspecified: Secondary | ICD-10-CM

## 2021-07-10 NOTE — Patient Instructions (Signed)

## 2021-08-06 ENCOUNTER — Other Ambulatory Visit: Payer: Self-pay | Admitting: Cardiology

## 2021-09-03 ENCOUNTER — Telehealth: Payer: Self-pay

## 2021-09-03 NOTE — Telephone Encounter (Signed)
Needs you to call him regarding BP lately.  Thank you

## 2021-09-04 ENCOUNTER — Encounter: Payer: Self-pay | Admitting: Pharmacist Clinician (PhC)/ Clinical Pharmacy Specialist

## 2021-09-12 ENCOUNTER — Other Ambulatory Visit: Payer: Self-pay | Admitting: Cardiology

## 2021-09-16 ENCOUNTER — Ambulatory Visit (HOSPITAL_COMMUNITY)
Admission: RE | Admit: 2021-09-16 | Discharge: 2021-09-16 | Disposition: A | Discharge status: Home / Self Care | Payer: Medicare HMO | Source: Ambulatory Visit | Attending: Cardiology | Admitting: Cardiology

## 2021-09-16 DIAGNOSIS — I7141 Pararenal abdominal aortic aneurysm, without rupture: Secondary | ICD-10-CM

## 2021-09-16 DIAGNOSIS — I714 Abdominal aortic aneurysm, without rupture, unspecified: Secondary | ICD-10-CM | POA: Insufficient documentation

## 2021-09-24 ENCOUNTER — Encounter: Payer: Self-pay | Admitting: Pharmacist Clinician (PhC)/ Clinical Pharmacy Specialist

## 2021-09-24 ENCOUNTER — Ambulatory Visit: Payer: Medicare HMO | Admitting: Pharmacist Clinician (PhC)/ Clinical Pharmacy Specialist

## 2021-09-24 VITALS — BP 148/75 | HR 62

## 2021-09-24 DIAGNOSIS — I10 Essential (primary) hypertension: Secondary | ICD-10-CM | POA: Diagnosis not present

## 2021-09-24 MED ORDER — LISINOPRIL 40 MG PO TABS: 40.0000 mg | ORAL_TABLET | Freq: Every day | ORAL | 3 refills | Status: DC

## 2021-09-24 NOTE — Assessment & Plan Note (Signed)
Patient with essential hypertension, not well controlled in the past few months.  Will increase lisinopril to 40 mg daily and continue with other medications.  He should repeat BMET in 2 weeks and let me know in 3-4 weeks if home BP readings have not dropped back into normal range.

## 2021-09-24 NOTE — Patient Instructions (Signed)
Reach out to me in 2-3 weeks to let me know how your BP is doing.   Go to the lab in 2 weeks (week of Sept 4) to check kidney function  Check your blood pressure at home daily and keep record of the readings.  Take your BP meds as follows:  Increase lisinopril to 40 mg once daily  Continue with all other medications  Bring all of your meds, your BP cuff and your record of home blood pressures to your next appointment.  Exercise as you're able, try to walk approximately 30 minutes per day.  Keep salt intake to a minimum, especially watch canned and prepared boxed foods.  Eat more fresh fruits and vegetables and fewer canned items.  Avoid eating in fast food restaurants.    HOW TO TAKE YOUR BLOOD PRESSURE: Rest 5 minutes before taking your blood pressure.  Don't smoke or drink caffeinated beverages for at least 30 minutes before. Take your blood pressure before (not after) you eat. Sit comfortably with your back supported and both feet on the floor (don't cross your legs). Elevate your arm to heart level on a table or a desk. Use the proper sized cuff. It should fit smoothly and snugly around your bare upper arm. There should be enough room to slip a fingertip under the cuff. The bottom edge of the cuff should be 1 inch above the crease of the elbow. Ideally, take 3 measurements at one sitting and record the average.

## 2021-09-24 NOTE — Progress Notes (Signed)
Patient ID: Curtis Weiss                 DOB: 27-Aug-1945                      MRN: [id number]     HPI: Curtis Weiss is a 76 y.o. male referred by Dr. Stanford Breed, with a PMH significant for HTN, CAD, AAA without rupture, & is s/p CABGx3 in 11/2018 presenting today to the HTN clinic for follow up.  We originally worked with patient about a year ago and were able to stabilize his blood pressure on a combination of quinapril hctz, carvedilol, amlodipine and spironolactone.   We then saw him earlier this year and he was stabilized on quinapril/hctz, hydralazine, eplerenone, and amlodipine.  Carvedilol had been discontinued due to fatigue and spironolactone due to gynecomastia.  He was most recently seen by Dr. Stanford Breed in June of this year.  BP at that time was good at 120/56.  Since then he has noted home readings creeping up some and wanted to return for a follow up.    Today he notes that he has been having some headaches recently, they last about 30-60 minutes then go away.  He describes them as throbbing and occurring in the hairline area above his left eye.  States they go away on their own, he doesn't take anything and they are not limiting his activity.    Current HTN meds: Lisinopril 20 mg qd Hctz 25 mg qd Amlodipine 10 mg qpm Hydralazine 25 mg bid - started 02/09/20  Previously tried:  Irbesartan Chlorthalidone metoprolol Lisinopril '20mg'$  daily - hypotension (shortly after open heart surgery) Hydralazine '50mg'$  bid - stopped d/t headaches (though headaches continued after d/c) Spironolactone - gynecomastia Quinapril - recalled    BP goal: <130/80  Family History:  Mother - HTN (deceased at 84 yo) Father - died at 70 after fall and broken hip Brother - CABG Sister - Lung cancer (deceased) Daughter - HTN  Social History: Former smoker (quit 40 yrs ago), 1-2 drinks daily, drinks 1 coffee in the morning but not everyday  Diet: mostly home cooked meals, no added salt  Exercise: still  golfs regularly, has not been walking as much recently  Home BP readings:   Average 150/79 (last 20 days)  HR 77     At last visit average was 134/71    Wt Readings from Last 3 Encounters:  07/10/21 200 lb (90.7 kg)  03/13/21 206 lb (93.4 kg)  01/21/21 205 lb 3.2 oz (93.1 kg)   BP Readings from Last 3 Encounters:  09/24/21 (!) 148/75  07/10/21 (!) 120/56  04/29/21 126/71   Pulse Readings from Last 3 Encounters:  09/24/21 62  07/10/21 68  04/29/21 (!) 56    Renal function: CrCl cannot be calculated (Patient's most recent lab result is older than the maximum 21 days allowed.).  Past Medical History:  Diagnosis Date   AAA (abdominal aortic aneurysm) without rupture (HCC)    Chronic insomnia    Herniated lumbar intervertebral disc    Hypertension    Idiopathic peripheral neuropathy    Pure hypercholesterolemia     Current Outpatient Medications on File Prior to Visit  Medication Sig Dispense Refill   allopurinol (ZYLOPRIM) 100 MG tablet Take 100 mg by mouth 2 (two) times daily.     amLODipine (NORVASC) 10 MG tablet TAKE ONE TABLET BY MOUTH DAILY 90 tablet 3   aspirin EC 81 MG  tablet Take 81 mg by mouth daily.     atorvastatin (LIPITOR) 80 MG tablet TAKE ONE TABLET BY MOUTH DAILY AT 6PM 90 tablet 3   gabapentin (NEURONTIN) 300 MG capsule Take 300 mg by mouth 2 (two) times daily.     hydrALAZINE (APRESOLINE) 25 MG tablet TAKE ONE TABLET BY MOUTH TWICE A DAY 180 tablet 3   hydrochlorothiazide (HYDRODIURIL) 25 MG tablet TAKE ONE TABLET BY MOUTH DAILY 90 tablet 3   Magnesium 500 MG CAPS Take 500 mg by mouth daily.     Multiple Vitamins-Minerals (MULTIVITAMIN PO) Take 1 tablet by mouth daily.     sildenafil (REVATIO) 20 MG tablet Take by mouth.     colchicine 0.6 MG tablet Take 0.6 mg by mouth daily as needed (Gout).     ezetimibe (ZETIA) 10 MG tablet Take 10 mg by mouth daily. (Patient not taking: Reported on 09/24/2021)     No current facility-administered medications on  file prior to visit.    No Known Allergies  Blood pressure (!) 148/75, pulse 62.  Hypertension Patient with essential hypertension, not well controlled in the past few months.  Will increase lisinopril to 40 mg daily and continue with other medications.  He should repeat BMET in 2 weeks and let me know in 3-4 weeks if home BP readings have not dropped back into normal range.      Tommy Medal PharmD CPP [facility] [address] [place] [address] 09/24/2021 3:27 PM

## 2021-09-25 ENCOUNTER — Encounter: Payer: Self-pay | Admitting: *Deleted

## 2021-10-17 ENCOUNTER — Other Ambulatory Visit (HOSPITAL_COMMUNITY): Payer: Medicare HMO

## 2021-10-18 ENCOUNTER — Encounter: Payer: Self-pay | Admitting: Pharmacist Clinician (PhC)/ Clinical Pharmacy Specialist

## 2021-10-18 DIAGNOSIS — I10 Essential (primary) hypertension: Secondary | ICD-10-CM

## 2021-11-08 ENCOUNTER — Other Ambulatory Visit: Payer: Self-pay | Admitting: Cardiology

## 2021-11-08 DIAGNOSIS — I25110 Atherosclerotic heart disease of native coronary artery with unstable angina pectoris: Secondary | ICD-10-CM

## 2021-11-13 LAB — BASIC METABOLIC PANEL
BUN/Creatinine Ratio: 15 (ref 10–24)
BUN: 15 mg/dL (ref 8–27)
CO2: 22 mmol/L (ref 20–29)
Calcium: 9.5 mg/dL (ref 8.6–10.2)
Chloride: 105 mmol/L (ref 96–106)
Creatinine, Ser: 1 mg/dL (ref 0.76–1.27)
Glucose: 109 mg/dL — ABNORMAL HIGH (ref 70–99)
Potassium: 3.8 mmol/L (ref 3.5–5.2)
Sodium: 142 mmol/L (ref 134–144)
eGFR: 78 mL/min/{1.73_m2} (ref >59)

## 2021-12-11 ENCOUNTER — Other Ambulatory Visit: Payer: Self-pay | Admitting: Cardiology

## 2022-03-31 ENCOUNTER — Encounter: Payer: Self-pay | Admitting: Pharmacist Clinician (PhC)/ Clinical Pharmacy Specialist

## 2022-03-31 MED ORDER — HYDRALAZINE HCL 50 MG PO TABS: 50.0000 mg | ORAL_TABLET | Freq: Two times a day (BID) | ORAL | 3 refills | Status: DC

## 2022-06-25 ENCOUNTER — Other Ambulatory Visit: Payer: Self-pay | Admitting: Cardiology

## 2022-08-18 ENCOUNTER — Other Ambulatory Visit: Payer: Self-pay | Admitting: *Deleted

## 2022-08-18 DIAGNOSIS — I7143 Infrarenal abdominal aortic aneurysm, without rupture: Secondary | ICD-10-CM

## 2022-09-11 ENCOUNTER — Ambulatory Visit (HOSPITAL_COMMUNITY)
Admission: RE | Admit: 2022-09-11 | Discharge: 2022-09-11 | Disposition: A | Discharge status: Home / Self Care | Payer: Medicare HMO | Source: Ambulatory Visit | Attending: Cardiology | Admitting: Cardiology

## 2022-09-11 DIAGNOSIS — I7143 Infrarenal abdominal aortic aneurysm, without rupture: Secondary | ICD-10-CM | POA: Insufficient documentation

## 2022-09-12 ENCOUNTER — Encounter: Payer: Self-pay | Admitting: *Deleted

## 2022-09-20 ENCOUNTER — Other Ambulatory Visit: Payer: Self-pay | Admitting: Cardiology

## 2023-01-07 ENCOUNTER — Ambulatory Visit: Payer: Medicare HMO | Admitting: Cardiology

## 2023-01-30 NOTE — Progress Notes (Signed)
 HPI: FU CAD and hypertension. Based on outside records he has had renal Dopplers previously that showed no renal artery stenosis and normal catecholamine and cortisol levels. Carotid Dopplers October 2020 showed 1 to 39% bilateral stenosis.  Patient underwent coronary artery bypass and graft October 2020 with LIMA to the LAD, RIMA to the PDA and saphenous vein graft to the diagonal. Cardiac catheterization November 2022 showed 30% left main, 60% followed by 65% LAD, 90% mid LAD with 95% first diagonal, 35% circumflex, 70% right coronary artery followed by 90% lesion and 99% mid RCA.  The RIMA to the right coronary artery, LIMA to the LAD and saphenous vein graft to D1 are all patent.  Ejection fraction 55 to 65%; medical therapy recommended.  Echocardiogram November 2022 showed ejection fraction 60 to 65%, mild to moderate left atrial enlargement.  Monitor December 2022 showed sinus rhythm with occasional PAC, PVC, rare ventricular couplet and Mobitz 1 second-degree AV block in the early a.m. hours.  Abdominal ultrasound August 2024 showed 3.7 cm abdominal aortic aneurysm.  Since last seen he occasionally feels dyspneic.  This occurs with climbing 1 flight of stairs.  However he can exercise with no dyspnea.  He denies orthopnea or PND.  Occasional minimal pedal edema.  No chest pain or syncope.  Current Outpatient Medications  Medication Sig Dispense Refill   allopurinol (ZYLOPRIM) 100 MG tablet Take 100 mg by mouth 2 (two) times daily.     amLODipine  (NORVASC ) 10 MG tablet TAKE 1 TABLET BY MOUTH DAILY 90 tablet 3   aspirin  EC 81 MG tablet Take 81 mg by mouth daily.     atorvastatin  (LIPITOR ) 80 MG tablet TAKE ONE TABLET BY MOUTH DAILY AT 6PM 90 tablet 1   colchicine  0.6 MG tablet Take 0.6 mg by mouth daily as needed (Gout).     gabapentin (NEURONTIN) 300 MG capsule Take 300 mg by mouth 2 (two) times daily.     hydrALAZINE  (APRESOLINE ) 50 MG tablet Take 1 tablet (50 mg total) by mouth 2 (two)  times daily. 180 tablet 3   hydrochlorothiazide  (HYDRODIURIL ) 25 MG tablet TAKE 1 TABLET BY MOUTH DAILY 90 tablet 1   lisinopril  (ZESTRIL ) 40 MG tablet TAKE 1 TABLET BY MOUTH DAILY 90 tablet 1   Magnesium  500 MG CAPS Take 500 mg by mouth daily.     Multiple Vitamins-Minerals (MULTIVITAMIN PO) Take 1 tablet by mouth daily.     ezetimibe  (ZETIA ) 10 MG tablet Take 10 mg by mouth daily. (Patient not taking: Reported on 09/24/2021)     No current facility-administered medications for this visit.     Past Medical History:  Diagnosis Date   AAA (abdominal aortic aneurysm) without rupture (HCC)    Chronic insomnia    Herniated lumbar intervertebral disc    Hypertension    Idiopathic peripheral neuropathy    Pure hypercholesterolemia     Past Surgical History:  Procedure Laterality Date   Arm surgery     CLIPPING OF ATRIAL APPENDAGE N/A 11/30/2018   Procedure: Clipping Of Atrial Appendage - using AtriCure clip size 35mm;  Surgeon: German Bartlett PEDLAR, MD;  Location: [facility] OR;  Service: Open Heart Surgery;  Laterality: N/A;   CORONARY ARTERY BYPASS GRAFT N/A 11/30/2018   Procedure: CORONARY ARTERY BYPASS GRAFTING (CABG) x three, using bilateral mammary arteries and right leg greater saphenous vein harvested endoscopically;  Surgeon: German Bartlett PEDLAR, MD;  Location: [facility] OR;  Service: Open Heart Surgery;  Laterality: N/A;  LEFT HEART CATH AND CORONARY ANGIOGRAPHY N/A 11/26/2018   Procedure: LEFT HEART CATH AND CORONARY ANGIOGRAPHY;  Surgeon: Anner Alm ORN, MD;  Location: [facility] INVASIVE CV LAB;  Service: Cardiovascular;  Laterality: N/A;   LEFT HEART CATH AND CORS/GRAFTS ANGIOGRAPHY N/A 12/04/2020   Procedure: LEFT HEART CATH AND CORS/GRAFTS ANGIOGRAPHY;  Surgeon: Anner Alm ORN, MD;  Location: [facility] INVASIVE CV LAB;  Service: Cardiovascular;  Laterality: N/A;   SHOULDER SURGERY     TEE WITHOUT CARDIOVERSION N/A 11/30/2018   Procedure: TRANSESOPHAGEAL ECHOCARDIOGRAM (TEE);  Surgeon: German Bartlett PEDLAR,  MD;  Location: [facility] OR;  Service: Open Heart Surgery;  Laterality: N/A;    Social History   Socioeconomic History   Marital status: Married    Spouse name: Not on file   Number of children: 3   Years of education: Not on file   Highest education level: Not on file  Occupational History    Comment: Retired  Tobacco Use   Smoking status: Former   Smokeless tobacco: Never  Substance and Sexual Activity   Alcohol use: Yes    Comment: 14   Drug use: Not on file   Sexual activity: Not on file  Other Topics Concern   Not on file  Social History Narrative   Not on file   Social Drivers of Health   Financial Resource Strain: Low Risk  (08/26/2021)   [facility] to [facility]., [facility] visits prior to 04/05/2022., [facility]   Overall Financial Resource Strain (CARDIA)    Difficulty of Paying Living Expenses: Not hard at all  Food Insecurity: Low Risk  (10/27/2022)   Received from [facility]   Hunger Vital Sign    Worried About Running Out of Food in the Last Year: Never true    Ran Out of Food in the Last Year: Never true  Transportation Needs: No Transportation Needs (10/27/2022)   Received from [facility]    In the past 12 months, has lack of reliable transportation kept you from medical appointments, meetings, work or from getting things needed for daily living? : No  Physical Activity: Insufficiently Active (08/26/2021)   Received [facility], [facility] visits prior to 04/05/2022., [facility], [facility] visits prior to 04/05/2022.   Exercise Vital Sign    Days of Exercise per Week: 2 days    Minutes of Exercise per Session: 30 min  Stress: No Stress Concern Present (08/26/2021)   [facility], [facility] visits prior to 04/05/2022., [facility], [facility]  visits prior to 04/05/2022.   [employer] of Occupational Health - Occupational Stress Questionnaire    Feeling of Stress : Not at all  Social Connections: Socially Integrated (08/26/2021)   [facility], [facility] visits prior to 04/05/2022., [facility] visits prior to 04/05/2022., [facility]   Social Connection and Isolation Panel [NHANES]    Frequency of Communication with Friends and Family: Three times a week    Frequency of Social Gatherings with Friends and Family: Twice a week    Attends Religious Services: More than 4 times per year    Active Member of [employer] or Organizations: Yes    Attends Banker Meetings: 1 to 4 times per year    Marital Status: Married  Catering Manager Violence:  Not on file    Family History  Problem Relation Age of Onset   Hypertension Mother    Hypertension Father     ROS: no fevers or chills, productive cough, hemoptysis, dysphasia, odynophagia, melena, hematochezia, dysuria, hematuria, rash, seizure activity, orthopnea, PND, claudication. Remaining systems are negative.  Physical Exam: Well-developed well-nourished in no acute distress.  Skin is warm and dry.  HEENT is normal.  Neck is supple.  Chest is clear to auscultation with normal expansion.  Cardiovascular exam is regular rate and rhythm.  Abdominal exam nontender or distended. No masses palpated. Extremities show no edema. neuro grossly intact  EKG Interpretation Date/Time:  Wednesday February 11 2023 09:16:57 EST Ventricular Rate:  61 PR Interval:  192 QRS Duration:  86 QT Interval:  424 QTC Calculation: 426 R Axis:   -16  Text Interpretation: Normal sinus rhythm Left ventricular hypertrophy with repolarization abnormality ( R in aVL ) When compared with ECG of 01-Dec-2018 06:39, Sinus rhythm has replaced Electronic ventricular pacemaker Confirmed by Pietro Rogue ([id number]) on 02/11/2023 9:17:48 AM      A/P  1 coronary artery disease status post coronary bypass and graft-patient denies chest pain.  Continue aspirin  and statin.  2 abdominal aortic aneurysm-plan follow-up ultrasound August 2025.  3 hypertension-blood pressure controlled today.  Continue present medical regimen.  Check potassium and renal function.  4 hyperlipidemia-continue statin.  Check lipids.  Recent LFTs normal.  5 dyspnea-patient has occasional dyspnea with climbing stairs.  However he can exercise otherwise with no dyspnea.  Etiology unclear.  Will repeat echocardiogram.  Check hemoglobin.  He denies chest pain and I do not think his symptoms sound ischemia mediated.  Rogue Pietro, MD

## 2023-02-11 ENCOUNTER — Encounter: Payer: Self-pay | Admitting: Cardiology

## 2023-02-11 ENCOUNTER — Ambulatory Visit: Payer: Medicare HMO | Attending: Cardiology | Admitting: Cardiology

## 2023-02-11 VITALS — BP 138/62 | HR 61 | Ht 71.0 in | Wt 189.0 lb

## 2023-02-11 DIAGNOSIS — E7800 Pure hypercholesterolemia, unspecified: Secondary | ICD-10-CM

## 2023-02-11 DIAGNOSIS — R0602 Shortness of breath: Secondary | ICD-10-CM

## 2023-02-11 DIAGNOSIS — I25110 Atherosclerotic heart disease of native coronary artery with unstable angina pectoris: Secondary | ICD-10-CM | POA: Diagnosis not present

## 2023-02-11 DIAGNOSIS — I10 Essential (primary) hypertension: Secondary | ICD-10-CM | POA: Diagnosis not present

## 2023-02-11 DIAGNOSIS — I714 Abdominal aortic aneurysm, without rupture, unspecified: Secondary | ICD-10-CM | POA: Diagnosis not present

## 2023-02-11 NOTE — Patient Instructions (Signed)
   Testing/Procedures:  Your physician has requested that you have an echocardiogram. Echocardiography is a painless test that uses sound waves to create images of your heart. It provides your doctor with information about the size and shape of your heart and how well your heart's chambers and valves are working. This procedure takes approximately one hour. There are no restrictions for this procedure. Please do NOT wear cologne, perfume, aftershave, or lotions (deodorant is allowed). Please arrive 15 minutes prior to your appointment time.  Please note: We ask at that you not bring children with you during ultrasound (echo/ vascular) testing. Due to room size and safety concerns, children are not allowed in the ultrasound rooms during exams. Our front office staff cannot provide observation of children in our lobby area while testing is being conducted. An adult accompanying a patient to their appointment will only be allowed in the ultrasound room at the discretion of the ultrasound technician under special circumstances. We apologize for any inconvenience. [facility] FLOOR IMAGING DEPARTMENT   Follow-Up: At [facility], you and your health needs are our priority.  As part of our continuing mission to provide you with exceptional heart care, we have created designated Provider Care Teams.  These Care Teams include your primary Cardiologist (physician) and Advanced Practice Providers (APPs -  Physician Assistants and Nurse Practitioners) who all work together to provide you with the care you need, when you need it.  We recommend signing up for the patient portal called MyChart.  Sign up information is provided on this After Visit Summary.  MyChart is used to connect with patients for Virtual Visits (Telemedicine).  Patients are able to view lab/test results, encounter notes, upcoming appointments, etc.  Non-urgent messages can be sent to your provider as well.   To learn  more about what you can do with MyChart, go to [email or web address].    Your next appointment:   6 month(s)  Provider:   Redell Shallow, MD

## 2023-02-12 LAB — BASIC METABOLIC PANEL
BUN/Creatinine Ratio: 17 (ref 10–24)
BUN: 18 mg/dL (ref 8–27)
CO2: 21 mmol/L (ref 20–29)
Calcium: 10 mg/dL (ref 8.6–10.2)
Chloride: 103 mmol/L (ref 96–106)
Creatinine, Ser: 1.07 mg/dL (ref 0.76–1.27)
Glucose: 128 mg/dL — ABNORMAL HIGH (ref 70–99)
Potassium: 4.3 mmol/L (ref 3.5–5.2)
Sodium: 145 mmol/L — ABNORMAL HIGH (ref 134–144)
eGFR: 71 mL/min/{1.73_m2} (ref >59)

## 2023-02-12 LAB — LIPID PANEL
Chol/HDL Ratio: 2.6 {ratio} (ref 0.0–5.0)
Cholesterol, Total: 130 mg/dL (ref 100–199)
HDL: 50 mg/dL (ref >39)
LDL Chol Calc (NIH): 64 mg/dL (ref 0–99)
Triglycerides: 83 mg/dL (ref 0–149)
VLDL Cholesterol Cal: 16 mg/dL (ref 5–40)

## 2023-02-12 LAB — CBC
Hematocrit: 47.1 % (ref 37.5–51.0)
Hemoglobin: 16 g/dL (ref 13.0–17.7)
MCH: 34 pg — ABNORMAL HIGH (ref 26.6–33.0)
MCHC: 34 g/dL (ref 31.5–35.7)
MCV: 100 fL — ABNORMAL HIGH (ref 79–97)
Platelets: 162 10*3/uL (ref 150–450)
RBC: 4.7 x10E6/uL (ref 4.14–5.80)
RDW: 12.7 % (ref 11.6–15.4)
WBC: 7.8 10*3/uL (ref 3.4–10.8)

## 2023-03-09 ENCOUNTER — Encounter: Payer: Self-pay | Admitting: *Deleted

## 2023-03-09 ENCOUNTER — Ambulatory Visit (HOSPITAL_BASED_OUTPATIENT_CLINIC_OR_DEPARTMENT_OTHER)
Admission: RE | Admit: 2023-03-09 | Discharge: 2023-03-09 | Disposition: A | Discharge status: Home / Self Care | Payer: Medicare HMO | Source: Ambulatory Visit | Attending: Cardiology | Admitting: Cardiology

## 2023-03-09 DIAGNOSIS — R0602 Shortness of breath: Secondary | ICD-10-CM | POA: Insufficient documentation

## 2023-03-09 LAB — ECHOCARDIOGRAM COMPLETE
AR max vel: 2.75 cm2
AV Area VTI: 3.05 cm2
AV Area mean vel: 2.78 cm2
AV Mean grad: 4 mm[Hg]
AV Peak grad: 7.6 mm[Hg]
Ao pk vel: 1.38 m/s
Area-P 1/2: 3.54 cm2
Calc EF: 69.8 %
MV M vel: 5.54 m/s
MV Peak grad: 122.8 mm[Hg]
S' Lateral: 3.2 cm
Single Plane A2C EF: 70.4 %
Single Plane A4C EF: 70.8 %

## 2023-03-23 ENCOUNTER — Other Ambulatory Visit: Payer: Self-pay

## 2023-03-23 MED ORDER — HYDRALAZINE HCL 50 MG PO TABS: 50.0000 mg | ORAL_TABLET | Freq: Two times a day (BID) | ORAL | 3 refills | Status: DC

## 2023-03-26 ENCOUNTER — Other Ambulatory Visit: Payer: Self-pay | Admitting: Cardiology

## 2023-04-01 ENCOUNTER — Other Ambulatory Visit: Payer: Self-pay | Admitting: Cardiology

## 2023-04-09 ENCOUNTER — Other Ambulatory Visit: Payer: Self-pay | Admitting: Cardiology

## 2023-06-17 ENCOUNTER — Other Ambulatory Visit: Payer: Self-pay | Admitting: Cardiology

## 2023-07-28 ENCOUNTER — Ambulatory Visit
Admission: EM | Admit: 2023-07-28 | Discharge: 2023-07-28 | Disposition: A | Discharge status: Home / Self Care | Attending: Family Medicine | Admitting: Family Medicine

## 2023-07-28 DIAGNOSIS — R300 Dysuria: Secondary | ICD-10-CM | POA: Diagnosis present

## 2023-07-28 DIAGNOSIS — R1032 Left lower quadrant pain: Secondary | ICD-10-CM | POA: Diagnosis not present

## 2023-07-28 DIAGNOSIS — M545 Low back pain, unspecified: Secondary | ICD-10-CM | POA: Insufficient documentation

## 2023-07-28 LAB — POCT URINALYSIS DIP (MANUAL ENTRY)
Bilirubin, UA: NEGATIVE
Blood, UA: NEGATIVE
Glucose, UA: NEGATIVE mg/dL
Ketones, POC UA: NEGATIVE mg/dL
Leukocytes, UA: NEGATIVE
Nitrite, UA: NEGATIVE
Protein Ur, POC: 30 mg/dL — AB
Spec Grav, UA: 1.025 (ref 1.010–1.025)
Urobilinogen, UA: 0.2 U/dL
pH, UA: 6 (ref 5.0–8.0)

## 2023-07-28 MED ORDER — CYCLOBENZAPRINE HCL 5 MG PO TABS: 5.0000 mg | ORAL_TABLET | Freq: Every evening | ORAL | 0 refills | Status: AC | PRN

## 2023-07-28 MED ORDER — LIDOCAINE 5 % EX PTCH: 1.0000 | MEDICATED_PATCH | CUTANEOUS | 0 refills | Status: AC

## 2023-07-28 MED ORDER — KETOROLAC TROMETHAMINE 30 MG/ML IJ SOLN
15.0000 mg | Freq: Once | INTRAMUSCULAR | Status: AC
  Administered 2023-07-28: 15 mg via INTRAMUSCULAR

## 2023-07-28 NOTE — ED Triage Notes (Signed)
 Pt present with c/o lt side abd pain that radiates to the lower lt back. C/o of having a burning feeling when voiding.

## 2023-07-28 NOTE — Discharge Instructions (Addendum)
 You were given a Toradol  injection in clinic today. Do not take any over the counter NSAID's such as Advil , ibuprofen , Aleve, or naproxen for 24 hours. You may take tylenol  if needed.  You may use a Lidoderm  patch to your lower back.  Leave in place for 12 hours and remove for 12 hours.  You may take Flexeril at night as needed.  This can make you drowsy.  Do not drink alcohol or drive while on the medication.  Heat and rest to low back.  My medical recommendation is that you go to the ER tonight for evaluation of your abdominal pain.  Please follow-up with your PCP in 1 to 2 days for recheck.  I hope you feel better soon!

## 2023-07-28 NOTE — ED Provider Notes (Addendum)
 [facility]    CSN: [id number] Arrival date & time: 07/28/23  1832      History   Chief Complaint No chief complaint on file.   HPI Curtis Weiss is a 78 y.o. male presents for back and abdominal pain.  Patient has a past medical history of hypertension, hyperlipidemia, CAD status post CABG, bladder cancer.  patient reports today he developed a left-sided lower abdominal pain that does not radiate.  States it has improved slightly but now he has developed a left lower back pain that does not radiate.  He states the pains are not connected.  Denies any numbness/tingling/weakness of his lower extremities, no bowel or bladder incontinence, no saddle paresthesia.  Is having some dysuria but no hematuria or odor.  No fevers, nausea/vomiting/diarrhea, flank pain.  Does report a history of intermittent low back pain.  No cough congestion malaise.  No history of GI diagnoses such as Crohn's, IBS, colitis, diverticulitis.  Took some Tylenol  without improvement.  He states he has been eating and drinking normally.  No other concerns at this time.  HPI  Past Medical History:  Diagnosis Date   AAA (abdominal aortic aneurysm) without rupture (HCC)    Chronic insomnia    Herniated lumbar intervertebral disc    Hypertension    Idiopathic peripheral neuropathy    Pure hypercholesterolemia     Patient Active Problem List   Diagnosis Date Noted   Exertional dyspnea 12/04/2020   S/P CABG x 3 11/30/2018   Coronary artery disease 11/30/2018   Unstable angina (HCC) 11/26/2018   Bradycardia 11/26/2018   Hypokalemia 11/26/2018   Coronary artery disease involving native coronary artery with angina pectoris (HCC) 11/26/2018   Hypertension    AAA (abdominal aortic aneurysm) without rupture (HCC)    Chest pain 11/25/2018    Past Surgical History:  Procedure Laterality Date   Arm surgery     CLIPPING OF ATRIAL APPENDAGE N/A 11/30/2018   Procedure: Clipping Of Atrial Appendage - using  AtriCure clip size 35mm;  Surgeon: German Bartlett PEDLAR, MD;  Location: [facility] OR;  Service: Open Heart Surgery;  Laterality: N/A;   CORONARY ARTERY BYPASS GRAFT N/A 11/30/2018   Procedure: CORONARY ARTERY BYPASS GRAFTING (CABG) x three, using bilateral mammary arteries and right leg greater saphenous vein harvested endoscopically;  Surgeon: German Bartlett PEDLAR, MD;  Location: [facility] OR;  Service: Open Heart Surgery;  Laterality: N/A;   LEFT HEART CATH AND CORONARY ANGIOGRAPHY N/A 11/26/2018   Procedure: LEFT HEART CATH AND CORONARY ANGIOGRAPHY;  Surgeon: Anner Alm ORN, MD;  Location: [facility] INVASIVE CV LAB;  Service: Cardiovascular;  Laterality: N/A;   LEFT HEART CATH AND CORS/GRAFTS ANGIOGRAPHY N/A 12/04/2020   Procedure: LEFT HEART CATH AND CORS/GRAFTS ANGIOGRAPHY;  Surgeon: Anner Alm ORN, MD;  Location: [facility] INVASIVE CV LAB;  Service: Cardiovascular;  Laterality: N/A;   SHOULDER SURGERY     TEE WITHOUT CARDIOVERSION N/A 11/30/2018   Procedure: TRANSESOPHAGEAL ECHOCARDIOGRAM (TEE);  Surgeon: German Bartlett PEDLAR, MD;  Location: [facility] OR;  Service: Open Heart Surgery;  Laterality: N/A;       Home Medications    Prior to Admission medications   Medication Sig Start Date End Date Taking? Authorizing Provider  cyclobenzaprine (FLEXERIL) 5 MG tablet Take 1 tablet (5 mg total) by mouth at bedtime as needed for muscle spasms. 07/28/23  Yes Rykin Route, Jodi R, NP  lidocaine  (LIDODERM ) 5 % Place 1 patch onto the skin daily. Apply to the low back for 12 hours  then remove for 12 hours 07/28/23  Yes Shirleen Mcfaul, Jodi R, NP  allopurinol (ZYLOPRIM) 100 MG tablet Take 100 mg by mouth 2 (two) times daily.    [provider]  amLODipine  (NORVASC ) 10 MG tablet TAKE 1 TABLET BY MOUTH DAILY 06/17/23   Pietro Redell RAMAN, MD  aspirin  EC 81 MG tablet Take 81 mg by mouth daily.    [provider]  atorvastatin  (LIPITOR ) 80 MG tablet TAKE 1 TABLET BY MOUTH DAILY AT 6PM 04/09/23   Pietro Redell RAMAN, MD  colchicine  0.6 MG tablet  Take 0.6 mg by mouth daily as needed (Gout). 05/30/19   [provider]  ezetimibe  (ZETIA ) 10 MG tablet Take 10 mg by mouth daily.    [provider]  gabapentin (NEURONTIN) 300 MG capsule Take 300 mg by mouth 2 (two) times daily.    [provider]  hydrALAZINE  (APRESOLINE ) 50 MG tablet Take 1 tablet (50 mg total) by mouth 2 (two) times daily. 03/23/23   Pietro Redell RAMAN, MD  hydrochlorothiazide  (HYDRODIURIL ) 25 MG tablet TAKE 1 TABLET BY MOUTH DAILY 04/02/23   Pietro Redell RAMAN, MD  lisinopril  (ZESTRIL ) 40 MG tablet TAKE 1 TABLET BY MOUTH DAILY 03/26/23   Pietro Redell RAMAN, MD  Magnesium  500 MG CAPS Take 500 mg by mouth daily. Patient not taking: Reported on 02/11/2023    [provider]  Multiple Vitamins-Minerals (MULTIVITAMIN PO) Take 1 tablet by mouth daily.    [provider]    Family History Family History  Problem Relation Age of Onset   Hypertension Mother    Hypertension Father     Social History Social History   Tobacco Use   Smoking status: Former   Smokeless tobacco: Never  Substance Use Topics   Alcohol use: Yes    Comment: 14     Allergies   Patient has no known allergies.   Review of Systems Review of Systems  Gastrointestinal:  Positive for abdominal pain.  Musculoskeletal:  Positive for back pain.     Physical Exam Triage Vital Signs ED Triage Vitals [07/28/23 1839]  Encounter Vitals Group     BP 132/66     Girls Systolic BP Percentile      Girls Diastolic BP Percentile      Boys Systolic BP Percentile      Boys Diastolic BP Percentile      Pulse Rate 88     Resp 15     Temp 98.1 F (36.7 C)     Temp Source Oral     SpO2 97 %     Weight      Height      Head Circumference      Peak Flow      Pain Score 8     Pain Loc      Pain Education      Exclude from Growth Chart    No data found.  Updated Vital Signs BP 132/66   Pulse 88   Temp 98.1 F (36.7 C) (Oral)   Resp 15   SpO2 97%    Visual Acuity Right Eye Distance:   Left Eye Distance:   Bilateral Distance:    Right Eye Near:   Left Eye Near:    Bilateral Near:     Physical Exam Vitals and nursing note reviewed.  Constitutional:      General: He is not in acute distress.    Appearance: Normal appearance. He is not ill-appearing.  HENT:  Head: Normocephalic and atraumatic.   Eyes:     Pupils: Pupils are equal, round, and reactive to light.    Cardiovascular:     Rate and Rhythm: Normal rate.  Pulmonary:     Effort: Pulmonary effort is normal.  Abdominal:     Tenderness: There is abdominal tenderness in the left lower quadrant. There is no right CVA tenderness, left CVA tenderness, guarding or rebound. Negative signs include Rovsing's sign and McBurney's sign.   Musculoskeletal:     Thoracic back: Normal.     Lumbar back: Tenderness present. No swelling, edema, deformity, signs of trauma, lacerations, spasms or bony tenderness. Normal range of motion. Negative right straight leg raise test and negative left straight leg raise test. No scoliosis.       Back:     Comments: Strength 5 out of 5 bilateral lower extremities   Skin:    General: Skin is warm and dry.   Neurological:     General: No focal deficit present.     Mental Status: He is alert and oriented to person, place, and time.   Psychiatric:        Mood and Affect: Mood normal.        Behavior: Behavior normal.      UC Treatments / Results  Labs (all labs ordered are listed, but only abnormal results are displayed) Labs Reviewed  POCT URINALYSIS DIP (MANUAL ENTRY) - Abnormal; Notable for the following components:      Result Value   Protein Ur, POC =30 (*)    All other components within normal limits  URINE CULTURE   Comprehensive Metabolic Panel Order: [id number] Component Ref Range & Units 1 mo ago  Sodium 136 - 145 mmol/L 142  Potassium 3.5 - 5.1 mmol/L 4  Comment: NO VISIBLE HEMOLYSIS  Chloride 98 - 107 mmol/L  106  CO2 21 - 31 mmol/L 26  Anion Gap 6 - 14 mmol/L 10  Glucose, Random 70 - 99 mg/dL 863 High   Blood Urea Nitrogen (BUN) 7 - 25 mg/dL 16  Creatinine 9.29 - 8.69 mg/dL 9.03  eGFR >40 fO/fpw/8.26f7 81  Comment: GFR estimated by CKD-EPI equations(NKF 2021).  Recommend confirmation of Cr-based eGFR by using Cys-based eGFR and other filtration markers (if applicable) in complex cases and clinical decision-making, as needed.  Albumin  3.5 - 5.7 g/dL 4.5  Total Protein 6.4 - 8.9 g/dL 7.2  Bilirubin, Total 0.3 - 1.0 mg/dL 1  Alkaline Phosphatase (ALP) 34 - 104 U/L 87  Aspartate Aminotransferase (AST) 13 - 39 U/L 24  Alanine Aminotransferase (ALT) 7 - 52 U/L 26  Calcium  8.6 - 10.3 mg/dL 9.6  BUN/Creatinine Ratio   Comment: Creatinine is normal, ratio is not clinically indicated.  Resulting Agency [facility](CLIA# [id number])   Specimen Collected: 06/09/23 11:30   Performed by: [facility](CLIA# [id number]) Last Resulted: 06/09/23 18:52   EKG   Radiology No results found.  Procedures Procedures (including critical care time)  Medications Ordered in UC Medications  ketorolac  (TORADOL ) 30 MG/ML injection 15 mg (15 mg Intramuscular Given 07/28/23 1921)    Initial Impression / Assessment and Plan / UC Course  I have reviewed the triage vital signs and the nursing notes.  Pertinent labs & imaging results that were available during my care of the patient were reviewed by me and considered in my medical decision making (see chart for details).     Reviewed exam and symptoms  with patient.  Urine negative for UTI.  Will culture given symptoms.  Discussed abdominal pain and low back pain.  Patient states back pain and abdominal pain are not tied together and occur separately.  He reports his lower abdominal pain has improved but is still there.  Back pain/exam more consistent with musculoskeletal cause.  He was given  Toradol  in clinic.  He was monitored for 10 minutes after injection with no reaction noted and tolerated well.  Reports improvement in back pain.  He was instructed no NSAIDs for 24 hours and he verbalized understanding.  Lidoderm  patch and Flexeril as needed, side effect profile reviewed.  Discussed unable to workup his abdominal pain in this setting.  Discussed ER evaluation but he declined stating he is improving.  I advised that my medical recommendation is that he goes to the ER for his abdominal pain and patient verbalized understanding.  Advised PCP follow-up 1 to 2 days for recheck.  Strict ER precautions reviewed and patient verbalized understanding. Final Clinical Impressions(s) / UC Diagnoses   Final diagnoses:  Acute left-sided low back pain without sciatica  Left lower quadrant abdominal pain  Dysuria     Discharge Instructions      You were given a Toradol  injection in clinic today. Do not take any over the counter NSAID's such as Advil , ibuprofen , Aleve, or naproxen for 24 hours. You may take tylenol  if needed.  You may use a Lidoderm  patch to your lower back.  Leave in place for 12 hours and remove for 12 hours.  You may take Flexeril at night as needed.  This can make you drowsy.  Do not drink alcohol or drive while on the medication.  Heat and rest to low back.  My medical recommendation is that you go to the ER tonight for evaluation of your abdominal pain.  Please follow-up with your PCP in 1 to 2 days for recheck.  I hope you feel better soon!      ED Prescriptions     Medication Sig Dispense Auth. Provider   lidocaine  (LIDODERM ) 5 % Place 1 patch onto the skin daily. Apply to the low back for 12 hours then remove for 12 hours 15 patch Shanna Strength, Jodi R, NP   cyclobenzaprine (FLEXERIL) 5 MG tablet Take 1 tablet (5 mg total) by mouth at bedtime as needed for muscle spasms. 5 tablet Marcel Sorter, Jodi R, NP      PDMP not reviewed this encounter.   Loreda Myla SAUNDERS, NP 07/28/23  1944    Loreda Myla SAUNDERS, NP 07/28/23 1944

## 2023-07-29 ENCOUNTER — Ambulatory Visit (HOSPITAL_COMMUNITY): Payer: Self-pay

## 2023-07-29 LAB — URINE CULTURE: Culture: <10000 — AB

## 2023-08-22 ENCOUNTER — Encounter (HOSPITAL_BASED_OUTPATIENT_CLINIC_OR_DEPARTMENT_OTHER): Payer: Self-pay | Admitting: Emergency Medicine

## 2023-08-22 ENCOUNTER — Emergency Department (HOSPITAL_BASED_OUTPATIENT_CLINIC_OR_DEPARTMENT_OTHER)

## 2023-08-22 ENCOUNTER — Other Ambulatory Visit: Payer: Self-pay

## 2023-08-22 ENCOUNTER — Emergency Department (HOSPITAL_BASED_OUTPATIENT_CLINIC_OR_DEPARTMENT_OTHER)
Admission: EM | Admit: 2023-08-22 | Discharge: 2023-08-22 | Disposition: A | Discharge status: Home / Self Care | Attending: Emergency Medicine | Admitting: Emergency Medicine

## 2023-08-22 DIAGNOSIS — Z8551 Personal history of malignant neoplasm of bladder: Secondary | ICD-10-CM | POA: Diagnosis not present

## 2023-08-22 DIAGNOSIS — Z7982 Long term (current) use of aspirin: Secondary | ICD-10-CM | POA: Insufficient documentation

## 2023-08-22 DIAGNOSIS — R1032 Left lower quadrant pain: Secondary | ICD-10-CM | POA: Insufficient documentation

## 2023-08-22 DIAGNOSIS — R110 Nausea: Secondary | ICD-10-CM | POA: Diagnosis not present

## 2023-08-22 DIAGNOSIS — R109 Unspecified abdominal pain: Secondary | ICD-10-CM

## 2023-08-22 DIAGNOSIS — R61 Generalized hyperhidrosis: Secondary | ICD-10-CM | POA: Diagnosis not present

## 2023-08-22 LAB — URINALYSIS, ROUTINE W REFLEX MICROSCOPIC
Bilirubin Urine: NEGATIVE
Glucose, UA: NEGATIVE mg/dL
Hgb urine dipstick: NEGATIVE
Ketones, ur: 40 mg/dL — AB
Leukocytes,Ua: NEGATIVE
Nitrite: NEGATIVE
Protein, ur: NEGATIVE mg/dL
Specific Gravity, Urine: 1.025 (ref 1.005–1.030)
pH: 6 (ref 5.0–8.0)

## 2023-08-22 LAB — BASIC METABOLIC PANEL WITH GFR
Anion gap: 18 — ABNORMAL HIGH (ref 5–15)
BUN: 19 mg/dL (ref 8–23)
CO2: 19 mmol/L — ABNORMAL LOW (ref 22–32)
Calcium: 9.5 mg/dL (ref 8.9–10.3)
Chloride: 103 mmol/L (ref 98–111)
Creatinine, Ser: 1.38 mg/dL — ABNORMAL HIGH (ref 0.61–1.24)
GFR, Estimated: 52 mL/min — ABNORMAL LOW (ref >60)
Glucose, Bld: 97 mg/dL (ref 70–99)
Potassium: 3.8 mmol/L (ref 3.5–5.1)
Sodium: 140 mmol/L (ref 135–145)

## 2023-08-22 LAB — CBC
HCT: 40.8 % (ref 39.0–52.0)
Hemoglobin: 14.5 g/dL (ref 13.0–17.0)
MCH: 33.6 pg (ref 26.0–34.0)
MCHC: 35.5 g/dL (ref 30.0–36.0)
MCV: 94.7 fL (ref 80.0–100.0)
Platelets: 142 K/uL — ABNORMAL LOW (ref 150–400)
RBC: 4.31 MIL/uL (ref 4.22–5.81)
RDW: 12.6 % (ref 11.5–15.5)
WBC: 12.4 K/uL — ABNORMAL HIGH (ref 4.0–10.5)
nRBC: 0 % (ref 0.0–0.2)

## 2023-08-22 LAB — HEPATIC FUNCTION PANEL
ALT: 26 U/L (ref 0–44)
AST: 30 U/L (ref 15–41)
Albumin: 4.4 g/dL (ref 3.5–5.0)
Alkaline Phosphatase: 99 U/L (ref 38–126)
Bilirubin, Direct: 0.3 mg/dL — ABNORMAL HIGH (ref 0.0–0.2)
Indirect Bilirubin: 0.3 mg/dL (ref 0.3–0.9)
Total Bilirubin: 0.7 mg/dL (ref 0.0–1.2)
Total Protein: 7.5 g/dL (ref 6.5–8.1)

## 2023-08-22 LAB — LIPASE, BLOOD: Lipase: 60 U/L — ABNORMAL HIGH (ref 11–51)

## 2023-08-22 MED ORDER — OXYCODONE-ACETAMINOPHEN 5-325 MG PO TABS: 1.0000 | ORAL_TABLET | Freq: Four times a day (QID) | ORAL | 0 refills | Status: AC | PRN

## 2023-08-22 MED ORDER — HYDROMORPHONE HCL 1 MG/ML IJ SOLN
1.0000 mg | Freq: Once | INTRAMUSCULAR | Status: AC
  Administered 2023-08-22: 1 mg via INTRAVENOUS
  Filled 2023-08-22: qty 1

## 2023-08-22 MED ORDER — ONDANSETRON HCL 4 MG/2ML IJ SOLN
4.0000 mg | Freq: Once | INTRAMUSCULAR | Status: AC
  Administered 2023-08-22: 4 mg via INTRAVENOUS
  Filled 2023-08-22: qty 2

## 2023-08-22 MED ORDER — KETOROLAC TROMETHAMINE 15 MG/ML IJ SOLN
15.0000 mg | Freq: Once | INTRAMUSCULAR | Status: AC
  Administered 2023-08-22: 15 mg via INTRAVENOUS
  Filled 2023-08-22: qty 1

## 2023-08-22 MED ORDER — HYDROMORPHONE HCL 1 MG/ML IJ SOLN: 0.5000 mg | Freq: Once | INTRAMUSCULAR | Status: DC

## 2023-08-22 MED ORDER — SODIUM CHLORIDE 0.9 % IV BOLUS
1000.0000 mL | Freq: Once | INTRAVENOUS | Status: AC
  Administered 2023-08-22: 1000 mL via INTRAVENOUS

## 2023-08-22 MED ORDER — OXYCODONE-ACETAMINOPHEN 5-325 MG PO TABS
1.0000 | ORAL_TABLET | Freq: Once | ORAL | Status: AC
  Administered 2023-08-22: 1 via ORAL
  Filled 2023-08-22: qty 1

## 2023-08-22 MED ORDER — ONDANSETRON HCL 4 MG PO TABS
4.0000 mg | ORAL_TABLET | Freq: Four times a day (QID) | ORAL | 0 refills | Status: AC
Start: 2023-08-22 — End: 2023-08-29

## 2023-08-22 MED ORDER — TAMSULOSIN HCL 0.4 MG PO CAPS: 0.4000 mg | ORAL_CAPSULE | Freq: Every day | ORAL | 0 refills | Status: AC

## 2023-08-22 NOTE — ED Notes (Signed)
 ED Provider at bedside.

## 2023-08-22 NOTE — ED Provider Notes (Signed)
 [facility] Provider Note   CSN: [id number] Arrival date & time: 08/22/23  9179     Patient presents with: Flank Pain   Curtis Weiss is a 78 y.o. male.   78 year old male with a past medical history of bladder cancer, AAA presents to the ED with a chief complaint of left flank pain has been ongoing for the past few weeks.  Previously evaluated at urgent care, was told that symptoms will likely resolve after being given a prescription for tramadol .  He reports the tramadol  did help with the pain.  However, this morning he got up at 3 AM, attempted to avoid, was unable to do so as he reports urine did not flow .  He describes the pain as sharp originating at the left flank radiating down to the left lower quadrants right below the umbilicus.  He did take Aleve at 4 AM today with no improvement in symptoms.  He also endorses diaphoresis while he is in pain.  Has not had any coughs, no fever, no blood in his urine that he is noted no vomiting.  The history is provided by the patient.  Flank Pain This is a new problem. The current episode started 6 to 12 hours ago. The problem occurs constantly. The problem has not changed since onset.Pertinent negatives include no chest pain, no abdominal pain and no shortness of breath. Nothing aggravates the symptoms.       Prior to Admission medications   Medication Sig Start Date End Date Taking? Authorizing Provider  ondansetron  (ZOFRAN ) 4 MG tablet Take 1 tablet (4 mg total) by mouth every 6 (six) hours for 7 days. 08/22/23 08/29/23 Yes Jaira Canady, PA-C  oxyCODONE -acetaminophen  (PERCOCET/ROXICET) 5-325 MG tablet Take 1 tablet by mouth every 6 (six) hours as needed for severe pain (pain score 7-10). 08/22/23  Yes Nazaiah Navarrete, PA-C  tamsulosin  (FLOMAX ) 0.4 MG CAPS capsule Take 1 capsule (0.4 mg total) by mouth daily for 7 days. 08/22/23 08/29/23 Yes Generoso Cropper, PA-C  allopurinol (ZYLOPRIM) 100 MG tablet Take  100 mg by mouth 2 (two) times daily.    [provider]  amLODipine  (NORVASC ) 10 MG tablet TAKE 1 TABLET BY MOUTH DAILY 06/17/23   Pietro Redell RAMAN, MD  aspirin  EC 81 MG tablet Take 81 mg by mouth daily.    [provider]  atorvastatin  (LIPITOR ) 80 MG tablet TAKE 1 TABLET BY MOUTH DAILY AT 6PM 04/09/23   Pietro Redell RAMAN, MD  colchicine  0.6 MG tablet Take 0.6 mg by mouth daily as needed (Gout). 05/30/19   [provider]  cyclobenzaprine  (FLEXERIL ) 5 MG tablet Take 1 tablet (5 mg total) by mouth at bedtime as needed for muscle spasms. 07/28/23   Mayer, Jodi R, NP  ezetimibe  (ZETIA ) 10 MG tablet Take 10 mg by mouth daily.    [provider]  gabapentin (NEURONTIN) 300 MG capsule Take 300 mg by mouth 2 (two) times daily.    [provider]  hydrALAZINE  (APRESOLINE ) 50 MG tablet Take 1 tablet (50 mg total) by mouth 2 (two) times daily. 03/23/23   Pietro Redell RAMAN, MD  hydrochlorothiazide  (HYDRODIURIL ) 25 MG tablet TAKE 1 TABLET BY MOUTH DAILY 04/02/23   Pietro Redell RAMAN, MD  lidocaine  (LIDODERM ) 5 % Place 1 patch onto the skin daily. Apply to the low back for 12 hours then remove for 12 hours 07/28/23   Mayer, Jodi R, NP  lisinopril  (ZESTRIL ) 40 MG tablet TAKE  1 TABLET BY MOUTH DAILY 03/26/23   Pietro Redell RAMAN, MD  Magnesium  500 MG CAPS Take 500 mg by mouth daily. Patient not taking: Reported on 02/11/2023    [provider]  Multiple Vitamins-Minerals (MULTIVITAMIN PO) Take 1 tablet by mouth daily.    [provider]    Allergies: Patient has no known allergies.    Review of Systems  Constitutional:  Positive for diaphoresis. Negative for chills and fever.  Respiratory:  Negative for shortness of breath.   Cardiovascular:  Negative for chest pain.  Gastrointestinal:  Positive for nausea. Negative for abdominal pain and vomiting.  Genitourinary:  Positive for flank pain.  Musculoskeletal:  Positive for back pain.  All other systems  reviewed and are negative.   Updated Vital Signs BP (!) 171/62 (BP Location: Right Arm)   Pulse (!) 59   Temp (!) 97.4 F (36.3 C) (Oral)   Resp 18   Ht 6' 2 (1.88 m)   Wt 90.7 kg   SpO2 96%   BMI 25.68 kg/m   Physical Exam Vitals and nursing note reviewed.  Constitutional:      Appearance: Normal appearance.  HENT:     Head: Normocephalic and atraumatic.     Mouth/Throat:     Mouth: Mucous membranes are moist.  Cardiovascular:     Rate and Rhythm: Normal rate.  Pulmonary:     Effort: Pulmonary effort is normal.     Breath sounds: No wheezing.     Comments: No rales, no wheezing noted. Abdominal:     Palpations: Abdomen is soft.     Tenderness: There is left CVA tenderness.  Musculoskeletal:     Cervical back: Normal range of motion and neck supple.  Skin:    General: Skin is warm and dry.  Neurological:     Mental Status: He is alert and oriented to person, place, and time.     (all labs ordered are listed, but only abnormal results are displayed) Labs Reviewed  BASIC METABOLIC PANEL WITH GFR - Abnormal; Notable for the following components:      Result Value   CO2 19 (*)    Creatinine, Ser 1.38 (*)    GFR, Estimated 52 (*)    Anion gap 18 (*)    All other components within normal limits  CBC - Abnormal; Notable for the following components:   WBC 12.4 (*)    Platelets 142 (*)    All other components within normal limits  URINALYSIS, ROUTINE W REFLEX MICROSCOPIC - Abnormal; Notable for the following components:   Ketones, ur 40 (*)    All other components within normal limits  HEPATIC FUNCTION PANEL - Abnormal; Notable for the following components:   Bilirubin, Direct 0.3 (*)    All other components within normal limits  LIPASE, BLOOD - Abnormal; Notable for the following components:   Lipase 60 (*)    All other components within normal limits    EKG: None  Radiology: CT Renal Stone Study Result Date: 08/22/2023 CLINICAL DATA:  Abdominal pain.   Concern for kidney stone. EXAM: CT ABDOMEN AND PELVIS WITHOUT CONTRAST TECHNIQUE: Multidetector CT imaging of the abdomen and pelvis was performed following the standard protocol without IV contrast. RADIATION DOSE REDUCTION: This exam was performed according to the departmental dose-optimization program which includes automated exposure control, adjustment of the mA and/or kV according to patient size and/or use of iterative reconstruction technique. COMPARISON:  None Available. FINDINGS: Lower chest: Peripheral interstitial reticulation and  ground-glass attenuation noted within the right base. Subpleural nodule within the lateral right lower lobe measures 3 mm axial image 9 of the lung windows. Hepatobiliary: No suspicious liver abnormality. Small gallstones identified measuring 4 mm. No gallbladder wall thickening or inflammation. Pancreas: Unremarkable. No pancreatic ductal dilatation or surrounding inflammatory changes. Spleen: Normal in size without focal abnormality. Adrenals/Urinary Tract: Normal adrenal glands. Normal right kidney. Left-sided perinephric fat stranding and hydronephrosis is identified. There is left hydroureter. Within the distal left ureter just before the bladder there is a stone measuring 4 mm, axial image 80. Bladder appears normal. Stomach/Bowel: Distal colonic diverticulosis without signs of acute diverticulitis. No bowel wall thickening, inflammation or distension. Vascular/Lymphatic: Extensive aortic atherosclerotic calcifications. 3.4 cm infrarenal abdominal aorta. No abdominopelvic adenopathy. Reproductive: Prostate is unremarkable. Other: No ascites or focal fluid collections. Musculoskeletal: No fracture is seen. IMPRESSION: 1. Left-sided perinephric fat stranding and hydronephrosis is identified. Within the distal left ureter just before the bladder there is a stone measuring 4 mm. 2. Cholelithiasis. 3. Distal colonic diverticulosis without signs of acute diverticulitis. 4. 3.4  cm infrarenal abdominal aortic aneurysm. Recommend follow-up ultrasound every 3 years. (Ref.: J Vasc Surg. 2018; 67:2-77 and J Am Coll Radiol 2013;10(10):789-794.) 5. Peripheral interstitial reticulation and ground-glass attenuation noted within the right base. Findings are nonspecific sequelae of inflammation or infection. 6. 3 mm subpleural nodule within the lateral right lower lobe. If the patient is at high risk for bronchogenic carcinoma, follow-up chest CT at 1year is recommended. If the patient is at low risk, no follow-up is needed. This recommendation follows the consensus statement: Guidelines for Management of Small Pulmonary Nodules Detected on CT Scans: A Statement from the Fleischner Society as published in Radiology 2005; 237:395-400. 7.  Aortic Atherosclerosis (ICD10-I70.0). Electronically Signed   By: Waddell Calk M.D.   On: 08/22/2023 09:56     Procedures   Medications Ordered in the ED  oxyCODONE -acetaminophen  (PERCOCET/ROXICET) 5-325 MG per tablet 1 tablet (has no administration in time range)  sodium chloride  0.9 % bolus 1,000 mL (0 mLs Intravenous Stopped 08/22/23 1137)  ketorolac  (TORADOL ) 15 MG/ML injection 15 mg (15 mg Intravenous Given 08/22/23 0918)  ondansetron  (ZOFRAN ) injection 4 mg (4 mg Intravenous Given 08/22/23 0917)  HYDROmorphone  (DILAUDID ) injection 1 mg (1 mg Intravenous Given 08/22/23 1040)    Clinical Course as of 08/22/23 1154  Sat Aug 22, 2023  0948 WBC(!): 12.4 [JS]  0948 Anion gap(!): 18 [JS]  1108 Creatinine(!): 1.38 [JS]    Clinical Course User Index [JS] Reina Wilton, PA-C                                 Medical Decision Making Amount and/or Complexity of Data Reviewed Labs: ordered. Decision-making details documented in ED Course. Radiology: ordered.  Risk Prescription drug management.      This patient presents to the ED for concern of left flank pain, this involves a number of treatment options, and is a complaint that carries with  it a high risk of complications and morbidity.  The differential diagnosis includes renal colic, pyelonephritis, lower lobe pneumonia versus dissection.    Co morbidities: Discussed in HPI   Brief History:  See HPI.   EMR reviewed including pt PMHx, past surgical history and past visits to ER.   See HPI for more details   Lab Tests:  I ordered and independently interpreted labs.  The pertinent results include:  CBC with a slight leukocytosis of 12.4, hemoglobin is within normal limits.  BMP with no electrolyte derangement, anion gap is slightly elevated.  Hepatic function is within normal limits.  Lipase levels normal.  UA no nitrates, no leukocytes, not concerning for infection at this time.  Imaging Studies:  CT Renal stone study showed: IMPRESSION:  1. Left-sided perinephric fat stranding and hydronephrosis is  identified. Within the distal left ureter just before the bladder  there is a stone measuring 4 mm.  2. Cholelithiasis.  3. Distal colonic diverticulosis without signs of acute  diverticulitis.  4. 3.4 cm infrarenal abdominal aortic aneurysm. Recommend follow-up  ultrasound every 3 years. (Ref.: J Vasc Surg. 2018; 67:2-77 and J Am  Coll Radiol 2013;10(10):789-794.)  5. Peripheral interstitial reticulation and ground-glass attenuation  noted within the right base. Findings are nonspecific sequelae of  inflammation or infection.  6. 3 mm subpleural nodule within the lateral right lower lobe. If  the patient is at high risk for bronchogenic carcinoma, follow-up  chest CT at 1year is recommended. If the patient is at low risk, no  follow-up is needed. This recommendation follows the consensus  statement: Guidelines for Management of Small Pulmonary Nodules  Detected on CT Scans: A Statement from the Fleischner Society as  published in Radiology 2005; 237:395-400.  7.  Aortic Atherosclerosis (ICD10-I70.0).   Medicines ordered:  I ordered medication including  zofran , toradol ,bolus, dilaudid   for pain control Reevaluation of the patient after these medicines showed that the patient improved I have reviewed the patients home medicines and have made adjustments as needed   After the interventions noted above I re-evaluated patient and found that they have :improved   Social Determinants of Health:  The patient's social determinants of health were a factor in the care of this patient  Problem List / ED Course:  Patient presents to the ED with a chief complaint of left flank pain that has been ongoing for weeks, however worsened this morning when he tried to void.  States that he was diagnosed with likely kidney stone at urgent care, was given a prescription for tramadol  which did keep the pain at bay.  However now he feels that the pain returned this morning.  He also endorses some nausea and some diaphoresis when the pain exacerbates.  Blood work here is unremarkable with a CBC with just a slight leukocytosis, hemoglobin stable.  CMP that shows slight elevation in his creatinine from a baseline of 1-1.3, slight anion gap of 18.  Hepatic function within normal limits.  Lipase slightly elevated.  Does feel that the pain radiates from the left flank onto the left lower quadrant.  Given Toradol , Zofran  to help with symptomatic treatment while in the ED. CT abdomen does show a 4 mm nonobstructive stone on the left ureter.  I discussed these results with patient, he continues to endorse pain, therefore Dilaudid  given at this time for improvement in symptoms.  UA does not look concerning for infection without any nitrates or leukocytes. Patient had a second round of pain medication as 1 mg of Dilaudid , pain is controlled at this time.  In addition, he was given Percocet orally while in the ED, will have him follow-up with [facility].  He was given a prescription for Flomax , Percocet, Zofran  for supportive treatment at home along with a strainer.  I did  discuss this case with my attending Dr. Ismael who agrees with plan and management at this time.  Patient hemodynamically stable  for discharge.   Dispostion:  After consideration of the diagnostic results and the patients response to treatment, I feel that the patent would benefit from  Treatment of stone expulsion at home.  Follow-up with [facility]  Final diagnoses:  Left flank pain    ED Discharge Orders          Ordered    ondansetron  (ZOFRAN ) 4 MG tablet  Every 6 hours        08/22/23 1151    tamsulosin  (FLOMAX ) 0.4 MG CAPS capsule  Daily        08/22/23 1151    oxyCODONE -acetaminophen  (PERCOCET/ROXICET) 5-325 MG tablet  Every 6 hours PRN        08/22/23 1151               Merle Whitehorn, PA-C 08/22/23 1154    Armenta Canning, MD 08/22/23 1530

## 2023-08-22 NOTE — ED Triage Notes (Signed)
 Pain  in left flank pain and abd  feels urgency and freq  has had this before  unknown cause

## 2023-08-22 NOTE — Discharge Instructions (Addendum)
 I have prescribed Zofran  in order to help with your nausea, please take 1 tablet every 6 hours.  In addition, you were given Flomax , this medication should be taken daily, this medication to help with stone expulsion.  The last medication is Percocet, please take this medication every 6 hours for severe pain.  Follow-up with [facility] at your earliest convenience for further management of your stone.

## 2023-08-22 NOTE — ED Provider Notes (Signed)
 I provided a substantive portion of the care of this patient.  I personally made/approved the management plan for this patient and take responsibility for the patient management.     Patient has sudden onset of left flank pain at 3 AM.  Patient no prior history of kidney stones but positive family history.  1 episode about 4 weeks ago flank pain that was treated for pain and resolved completely until this episode.  Patient alert nontoxic.  He has pain controlled after Toradol  and Dilaudid .  Abdomen is soft and nontender.  I have reviewed CT imaging.  Patient has a 4 mm stone.  At this time with adequate pain control, no signs of urinary tract infection and otherwise stable, will plan for outpatient pain control follow-up with urology.  Return precautions reviewed.   Armenta Canning, MD 08/22/23 1149

## 2023-09-02 ENCOUNTER — Other Ambulatory Visit: Payer: Self-pay | Admitting: *Deleted

## 2023-09-02 DIAGNOSIS — I714 Abdominal aortic aneurysm, without rupture, unspecified: Secondary | ICD-10-CM

## 2023-10-10 ENCOUNTER — Ambulatory Visit (HOSPITAL_BASED_OUTPATIENT_CLINIC_OR_DEPARTMENT_OTHER)
Admission: RE | Admit: 2023-10-10 | Discharge: 2023-10-10 | Disposition: A | Discharge status: Home / Self Care | Source: Ambulatory Visit | Attending: Cardiology | Admitting: Cardiology

## 2023-10-10 DIAGNOSIS — I714 Abdominal aortic aneurysm, without rupture, unspecified: Secondary | ICD-10-CM | POA: Insufficient documentation

## 2023-10-13 NOTE — Progress Notes (Signed)
 HPI: FU CAD and hypertension. Based on outside records he has had renal Dopplers previously that showed no renal artery stenosis and normal catecholamine and cortisol levels. Carotid Dopplers October 2020 showed 1 to 39% bilateral stenosis.  Patient underwent coronary artery bypass and graft October 2020 with LIMA to the LAD, RIMA to the PDA and saphenous vein graft to the diagonal. Cardiac catheterization November 2022 showed 30% left main, 60% followed by 65% LAD, 90% mid LAD with 95% first diagonal, 35% circumflex, 70% right coronary artery followed by 90% lesion and 99% mid RCA.  The RIMA to the right coronary artery, LIMA to the LAD and saphenous vein graft to D1 are all patent.  Ejection fraction 55 to 65%; medical therapy recommended.   Monitor December 2022 showed sinus rhythm with occasional PAC, PVC, rare ventricular couplet and Mobitz 1 second-degree AV block in the early a.m. hours. Echocardiogram February 2025 showed normal LV function.  CT renal stone study September 2025 showed 3.4 infrarenal abdominal aortic aneurysm and 3 mm right lower lobe lung nodule.  Abdominal ultrasound September 2025 showed abdominal aortic aneurysm measuring 3.7 cm.  Since last seen notes some increase in fatigue.  He notes dyspnea with some vigorous activities like walking up hills but not with exercising.  No orthopnea, PND, pedal edema, chest pain or syncope.  Current Outpatient Medications  Medication Sig Dispense Refill   allopurinol (ZYLOPRIM) 100 MG tablet Take 100 mg by mouth 2 (two) times daily.     amLODipine  (NORVASC ) 10 MG tablet TAKE 1 TABLET BY MOUTH DAILY 90 tablet 2   aspirin  EC 81 MG tablet Take 81 mg by mouth daily.     atorvastatin  (LIPITOR ) 80 MG tablet TAKE 1 TABLET BY MOUTH DAILY AT 6PM 90 tablet 1   colchicine  0.6 MG tablet Take 0.6 mg by mouth daily as needed (Gout).     cyclobenzaprine  (FLEXERIL ) 5 MG tablet Take 1 tablet (5 mg total) by mouth at bedtime as needed for muscle spasms.  5 tablet 0   ezetimibe  (ZETIA ) 10 MG tablet Take 10 mg by mouth daily.     hydrALAZINE  (APRESOLINE ) 50 MG tablet Take 1 tablet (50 mg total) by mouth 2 (two) times daily. 180 tablet 3   hydrochlorothiazide  (HYDRODIURIL ) 25 MG tablet TAKE 1 TABLET BY MOUTH DAILY 90 tablet 3   lidocaine  (LIDODERM ) 5 % Place 1 patch onto the skin daily. Apply to the low back for 12 hours then remove for 12 hours 15 patch 0   lisinopril  (ZESTRIL ) 40 MG tablet TAKE 1 TABLET BY MOUTH DAILY 90 tablet 3   Multiple Vitamins-Minerals (MULTIVITAMIN PO) Take 1 tablet by mouth daily.     oxyCODONE -acetaminophen  (PERCOCET/ROXICET) 5-325 MG tablet Take 1 tablet by mouth every 6 (six) hours as needed for severe pain (pain score 7-10). 15 tablet 0   gabapentin (NEURONTIN) 300 MG capsule Take 300 mg by mouth 2 (two) times daily. (Patient not taking: Reported on 10/19/2023)     No current facility-administered medications for this visit.     Past Medical History:  Diagnosis Date   AAA (abdominal aortic aneurysm) without rupture (HCC)    Chronic insomnia    Herniated lumbar intervertebral disc    Hypertension    Idiopathic peripheral neuropathy    Pure hypercholesterolemia     Past Surgical History:  Procedure Laterality Date   Arm surgery     CLIPPING OF ATRIAL APPENDAGE N/A 11/30/2018   Procedure: Clipping Of Atrial Appendage -  using AtriCure clip size 35mm;  Surgeon: German Bartlett PEDLAR, MD;  Location: [facility] OR;  Service: Open Heart Surgery;  Laterality: N/A;   CORONARY ARTERY BYPASS GRAFT N/A 11/30/2018   Procedure: CORONARY ARTERY BYPASS GRAFTING (CABG) x three, using bilateral mammary arteries and right leg greater saphenous vein harvested endoscopically;  Surgeon: German Bartlett PEDLAR, MD;  Location: [facility] OR;  Service: Open Heart Surgery;  Laterality: N/A;   LEFT HEART CATH AND CORONARY ANGIOGRAPHY N/A 11/26/2018   Procedure: LEFT HEART CATH AND CORONARY ANGIOGRAPHY;  Surgeon: Anner Alm ORN, MD;  Location: [facility] INVASIVE CV  LAB;  Service: Cardiovascular;  Laterality: N/A;   LEFT HEART CATH AND CORS/GRAFTS ANGIOGRAPHY N/A 12/04/2020   Procedure: LEFT HEART CATH AND CORS/GRAFTS ANGIOGRAPHY;  Surgeon: Anner Alm ORN, MD;  Location: [facility] INVASIVE CV LAB;  Service: Cardiovascular;  Laterality: N/A;   SHOULDER SURGERY     TEE WITHOUT CARDIOVERSION N/A 11/30/2018   Procedure: TRANSESOPHAGEAL ECHOCARDIOGRAM (TEE);  Surgeon: German Bartlett PEDLAR, MD;  Location: [facility] OR;  Service: Open Heart Surgery;  Laterality: N/A;    Social History   Socioeconomic History   Marital status: Married    Spouse name: Not on file   Number of children: 3   Years of education: Not on file   Highest education level: Not on file  Occupational History    Comment: Retired  Tobacco Use   Smoking status: Former   Smokeless tobacco: Never  Advertising account planner   Vaping status: Never Used  Substance and Sexual Activity   Alcohol use: Yes    Comment: 14   Drug use: Never   Sexual activity: Not on file  Other Topics Concern   Not on file  Social History Narrative   Not on file   Social Drivers of Health   Financial Resource Strain: Low Risk  (08/26/2021)   [facility] visits prior to 04/05/2022.   Overall Financial Resource Strain (CARDIA)    Difficulty of Paying Living Expenses: Not hard at all  Food Insecurity: Low Risk  (10/27/2022)   Received from [facility]   Hunger Vital Sign    Within the past 12 months, you worried that your food would run out before you got money to buy more: Never true    Within the past 12 months, the food you bought just didn't last and you didn't have money to get more. : Never true  Transportation Needs: No Transportation Needs (10/27/2022)   Received from [facility]    In the past 12 months, has lack of reliable transportation kept you from medical appointments, meetings, work or from getting things needed for daily living? : No  Physical  Activity: Insufficiently Active (08/26/2021)   [facility] visits prior to 04/05/2022., [facility]   Exercise Vital Sign    On average, how many days per week do you engage in moderate to strenuous exercise (like a brisk walk)?: 2 days    On average, how many minutes do you engage in exercise at this level?: 30 min  Stress: No Stress Concern Present (08/26/2021)   Received from [facility] visits prior to 04/05/2022.   [employer] of Occupational Health - Occupational Stress Questionnaire    Feeling of Stress : Not at all  Social Connections: Socially Integrated (08/26/2021)   [facility] visits prior to 04/05/2022., [facility]  Social Connection and Isolation Panel    In a typical week, how many times do you talk on the phone with family, friends, or neighbors?: Three times a week    How often do you get together with friends or relatives?: Twice a week    How often do you attend church or religious services?: More than 4 times per year    Do you belong to any clubs or organizations such as church groups, unions, fraternal or athletic groups, or school groups?: Yes    How often do you attend meetings of the clubs or organizations you belong to?: 1 to 4 times per year    Are you married, widowed, divorced, separated, never married, or living with a partner?: Married  Intimate Partner Violence: Not on file    Family History  Problem Relation Age of Onset   Hypertension Mother    Hypertension Father     ROS: no fevers or chills, productive cough, hemoptysis, dysphasia, odynophagia, melena, hematochezia, dysuria, hematuria, rash, seizure activity, orthopnea, PND, pedal edema, claudication. Remaining systems are negative.  Physical Exam: Well-developed well-nourished in no acute distress.  Skin is warm and dry.  HEENT is normal.  Neck is supple.  Chest is clear to  auscultation with normal expansion.  Cardiovascular exam is regular rate and rhythm.  Abdominal exam nontender or distended. No masses palpated. Extremities show no edema. neuro grossly intact  A/P  1 coronary artery disease status post coronary artery bypass graft-patient denies chest pain but does note increased dyspnea on exertion.  Will arrange stress PET to screen for ischemia.  Continue medical therapy with aspirin  and statin.  2 hypertension-patient's blood pressure is controlled.  Continue present medications.  3 hyperlipidemia-continue statin.  4 abdominal aortic aneurysm-patient will need follow-up ultrasound September 2027.  5 lung nodule-I have asked him to follow-up with primary care for this issue.  6 fatigue-check hemoglobin and TSH.  Redell Shallow, MD

## 2023-10-19 ENCOUNTER — Encounter: Payer: Self-pay | Admitting: Cardiology

## 2023-10-19 ENCOUNTER — Ambulatory Visit: Admitting: Cardiology

## 2023-10-19 ENCOUNTER — Ambulatory Visit: Payer: Self-pay | Admitting: Cardiology

## 2023-10-19 VITALS — BP 134/63 | HR 67 | Ht 74.0 in | Wt 194.1 lb

## 2023-10-19 DIAGNOSIS — I714 Abdominal aortic aneurysm, without rupture, unspecified: Secondary | ICD-10-CM | POA: Diagnosis not present

## 2023-10-19 DIAGNOSIS — R0602 Shortness of breath: Secondary | ICD-10-CM

## 2023-10-19 DIAGNOSIS — E7800 Pure hypercholesterolemia, unspecified: Secondary | ICD-10-CM

## 2023-10-19 DIAGNOSIS — I10 Essential (primary) hypertension: Secondary | ICD-10-CM | POA: Diagnosis not present

## 2023-10-19 DIAGNOSIS — I25110 Atherosclerotic heart disease of native coronary artery with unstable angina pectoris: Secondary | ICD-10-CM

## 2023-10-19 NOTE — Patient Instructions (Signed)
   Testing/Procedures:     Please report to Radiology at the [facility] Main Entrance 30 minutes early for your test.  [address] [place], [place] [address]   How to Prepare for Your Cardiac PET/CT Stress Test:  Nothing to eat or drink, except water , 3 hours prior to arrival time.  NO caffeine/decaffeinated products, or chocolate 12 hours prior to arrival. (Please note decaffeinated beverages (teas/coffees) still contain caffeine).  If you have caffeine within 12 hours prior, the test will need to be rescheduled.  Medication instructions: Do not take erectile dysfunction medications for 72 hours prior to test (sildenafil, tadalafil)  You may take your remaining medications with water .  NO perfume, cologne or lotion on chest or abdomen area.  Total time is 1 to 2 hours; you may want to bring reading material for the waiting time.   In preparation for your appointment, medication and supplies will be purchased.  Appointment availability is limited, so if you need to cancel or reschedule, please call the Radiology Department Scheduler at [phone] 24 hours in advance to avoid a cancellation fee of $100.00  What to Expect When you Arrive:  Once you arrive and check in for your appointment, you will be taken to a preparation room within the Radiology Department.  A technologist or Nurse will obtain your medical history, verify that you are correctly prepped for the exam, and explain the procedure.  Afterwards, an IV will be started in your arm and electrodes will be placed on your skin for EKG monitoring during the stress portion of the exam. Then you will be escorted to the PET/CT scanner.  There, staff will get you positioned on the scanner and obtain a blood pressure and EKG.  During the exam, you will continue to be connected to the EKG and blood pressure machines.  A small, safe amount of a radioactive tracer will be injected in your IV to obtain a series of pictures  of your heart along with an injection of a stress agent.    After your Exam:  It is recommended that you eat a meal and drink a caffeinated beverage to counter act any effects of the stress agent.  Drink plenty of fluids for the remainder of the day and urinate frequently for the first couple of hours after the exam.  Your doctor will inform you of your test results within 7-10 business days.  For more information and frequently asked questions, please visit our website: [email or web address]  For questions about your test or how to prepare for your test, please call: Cardiac Imaging Nurse Navigators Office: [phone]   Follow-Up: At [facility], you and your health needs are our priority.  As part of our continuing mission to provide you with exceptional heart care, our providers are all part of one team.  This team includes your primary Cardiologist (physician) and Advanced Practice Providers or APPs (Physician Assistants and Nurse Practitioners) who all work together to provide you with the care you need, when you need it.  Your next appointment:   4 month(s)  Provider:   Redell Shallow, MD

## 2023-10-20 ENCOUNTER — Ambulatory Visit: Payer: Self-pay | Admitting: Cardiology

## 2023-10-20 LAB — CBC
Hematocrit: 43.8 % (ref 37.5–51.0)
Hemoglobin: 15 g/dL (ref 13.0–17.7)
MCH: 34.2 pg — ABNORMAL HIGH (ref 26.6–33.0)
MCHC: 34.2 g/dL (ref 31.5–35.7)
MCV: 100 fL — ABNORMAL HIGH (ref 79–97)
Platelets: 164 x10E3/uL (ref 150–450)
RBC: 4.38 x10E6/uL (ref 4.14–5.80)
RDW: 12.7 % (ref 11.6–15.4)
WBC: 6.7 x10E3/uL (ref 3.4–10.8)

## 2023-10-20 LAB — TSH: TSH: 2.21 u[IU]/mL (ref 0.450–4.500)

## 2023-10-23 ENCOUNTER — Other Ambulatory Visit: Payer: Self-pay | Admitting: Cardiology

## 2023-11-23 ENCOUNTER — Encounter (HOSPITAL_COMMUNITY): Payer: Self-pay

## 2023-11-23 ENCOUNTER — Telehealth (HOSPITAL_COMMUNITY): Payer: Self-pay | Admitting: *Deleted

## 2023-11-23 NOTE — Telephone Encounter (Signed)
 Received call from patient regarding upcoming cardiac imaging study; pt verbalizes understanding of appt date/time, parking situation and where to check in, pre-test NPO status and medications ordered, and verified current allergies; name and call back number provided for further questions should they arise Curtis Frame RN Navigator Cardiac Imaging [facility] [phone] office [phone] cell  Patient aware to avoid caffeine for 12 hours prior to test.

## 2023-11-23 NOTE — Telephone Encounter (Signed)
 Attempted to call patient regarding upcoming cardiac PET appointment. Left message on voicemail with name and callback number  Larey Brick RN Navigator Cardiac Imaging [facility] [phone] Office [phone] Cell  Reminder to avoid caffeine 12 hours prior to his cardiac PET study.

## 2023-11-24 ENCOUNTER — Ambulatory Visit (HOSPITAL_COMMUNITY)
Admission: RE | Admit: 2023-11-24 | Discharge: 2023-11-24 | Disposition: A | Discharge status: Home / Self Care | Source: Ambulatory Visit | Attending: Cardiology | Admitting: Cardiology

## 2023-11-24 ENCOUNTER — Other Ambulatory Visit: Payer: Self-pay | Admitting: Cardiovascular Disease

## 2023-11-24 DIAGNOSIS — R079 Chest pain, unspecified: Secondary | ICD-10-CM

## 2023-11-24 DIAGNOSIS — I25110 Atherosclerotic heart disease of native coronary artery with unstable angina pectoris: Secondary | ICD-10-CM | POA: Insufficient documentation

## 2023-11-24 LAB — NM PET CT CARDIAC PERFUSION MULTI W/ABSOLUTE BLOODFLOW
LV dias vol: 140 mL (ref 62–150)
LV sys vol: 58 mL (ref 4.2–5.8)
MBFR: 1.84
Nuc Rest EF: 59 %
Nuc Stress EF: 65 %
Rest HR: 59 {beats}/min
Rest MBF: 0.88 ml/g/min
Rest Nuclear Isotope Dose: 22 mCi
ST Depression (mm): 0 mm
Stress MBF: 1.62 ml/g/min
Stress Nuclear Isotope Dose: 21.9 mCi

## 2023-11-24 MED ORDER — RUBIDIUM RB82 GENERATOR (RUBYFILL)
21.9000 | PACK | Freq: Once | INTRAVENOUS | Status: AC
  Administered 2023-11-24: 21.9 via INTRAVENOUS

## 2023-11-24 MED ORDER — RUBIDIUM RB82 GENERATOR (RUBYFILL)
22.0000 | PACK | Freq: Once | INTRAVENOUS | Status: AC
  Administered 2023-11-24: 22 via INTRAVENOUS

## 2023-11-24 MED ORDER — REGADENOSON 0.4 MG/5ML IV SOLN
0.4000 mg | Freq: Once | INTRAVENOUS | Status: AC
Start: 2023-11-24 — End: 2023-11-24
  Administered 2023-11-24: 0.4 mg via INTRAVENOUS

## 2023-11-24 MED ORDER — REGADENOSON 0.4 MG/5ML IV SOLN
INTRAVENOUS | Status: AC
  Filled 2023-11-24: qty 5

## 2023-11-24 NOTE — Progress Notes (Signed)
 PET consent ordered.  Signed, Darryle DASEN. Barbaraann, MD, [facility]  [facility]  [address] [place], [place] [address] [phone]  8:10 AM

## 2024-02-11 ENCOUNTER — Encounter: Payer: Self-pay | Admitting: *Deleted

## 2024-03-09 ENCOUNTER — Ambulatory Visit: Admitting: Cardiology

## 2024-03-09 ENCOUNTER — Encounter: Payer: Self-pay | Admitting: Cardiology

## 2024-03-09 VITALS — BP 155/67 | HR 63 | Ht 72.0 in | Wt 196.0 lb

## 2024-03-09 DIAGNOSIS — I25110 Atherosclerotic heart disease of native coronary artery with unstable angina pectoris: Secondary | ICD-10-CM | POA: Diagnosis not present

## 2024-03-09 DIAGNOSIS — E7800 Pure hypercholesterolemia, unspecified: Secondary | ICD-10-CM

## 2024-03-09 DIAGNOSIS — I714 Abdominal aortic aneurysm, without rupture, unspecified: Secondary | ICD-10-CM

## 2024-03-09 DIAGNOSIS — I10 Essential (primary) hypertension: Secondary | ICD-10-CM | POA: Diagnosis not present

## 2024-03-09 MED ORDER — HYDRALAZINE HCL 50 MG PO TABS: 50.0000 mg | ORAL_TABLET | Freq: Three times a day (TID) | ORAL | 3 refills | Status: AC

## 2024-03-09 NOTE — Patient Instructions (Signed)
 Medication Instructions:   INCREASE HYDRALAZINE  TO 50 MG THREE TIMES DAILY  *If you need a refill on your cardiac medications before your next appointment, please call your pharmacy*   Follow-Up: At [facility], you and your health needs are our priority.  As part of our continuing mission to provide you with exceptional heart care, our providers are all part of one team.  This team includes your primary Cardiologist (physician) and Advanced Practice Providers or APPs (Physician Assistants and Nurse Practitioners) who all work together to provide you with the care you need, when you need it.  Your next appointment:   6 month(s)  Provider:   Redell Shallow, MD
